# Patient Record
Sex: Female | Born: 1935
Health system: Southern US, Community
[De-identification: ages and names within clinical notes are randomized; demographics above are authoritative.]

## PROBLEM LIST (undated history)

## (undated) DIAGNOSIS — M4800 Spinal stenosis, site unspecified: Secondary | ICD-10-CM

## (undated) DIAGNOSIS — N189 Chronic kidney disease, unspecified: Secondary | ICD-10-CM

## (undated) DIAGNOSIS — I10 Essential (primary) hypertension: Secondary | ICD-10-CM

## (undated) DIAGNOSIS — E785 Hyperlipidemia, unspecified: Secondary | ICD-10-CM

## (undated) HISTORY — PX: TONSILLECTOMY: SUR1361

## (undated) HISTORY — DX: Hyperlipidemia, unspecified: E78.5

## (undated) HISTORY — DX: Essential (primary) hypertension: I10

## (undated) HISTORY — DX: Spinal stenosis, site unspecified: M48.00

## (undated) HISTORY — DX: Chronic kidney disease, unspecified: N18.9

---

## 1975-11-09 HISTORY — PX: BREAST BIOPSY: SHX20

## 1979-11-09 HISTORY — PX: TOTAL VAGINAL HYSTERECTOMY: SHX2548

## 1999-07-24 ENCOUNTER — Encounter: Payer: Self-pay | Admitting: Surgery

## 1999-07-29 ENCOUNTER — Observation Stay (HOSPITAL_COMMUNITY): Admission: RE | Admit: 1999-07-29 | Discharge: 1999-07-30 | Payer: Self-pay | Admitting: Surgery

## 1999-11-09 HISTORY — PX: CHOLECYSTECTOMY, LAPAROSCOPIC: SHX56

## 2000-03-09 ENCOUNTER — Other Ambulatory Visit: Admission: RE | Admit: 2000-03-09 | Discharge: 2000-03-09 | Payer: Self-pay | Admitting: Obstetrics and Gynecology

## 2001-05-05 ENCOUNTER — Other Ambulatory Visit: Admission: RE | Admit: 2001-05-05 | Discharge: 2001-05-05 | Payer: Self-pay | Admitting: Obstetrics and Gynecology

## 2002-11-19 ENCOUNTER — Ambulatory Visit (HOSPITAL_COMMUNITY): Admission: RE | Admit: 2002-11-19 | Discharge: 2002-11-19 | Payer: Self-pay | Admitting: Gastroenterology

## 2003-04-01 ENCOUNTER — Encounter: Admission: RE | Admit: 2003-04-01 | Discharge: 2003-05-02 | Payer: Self-pay | Admitting: Family Medicine

## 2011-11-15 DIAGNOSIS — Z1231 Encounter for screening mammogram for malignant neoplasm of breast: Secondary | ICD-10-CM | POA: Diagnosis not present

## 2011-12-16 DIAGNOSIS — H4011X0 Primary open-angle glaucoma, stage unspecified: Secondary | ICD-10-CM | POA: Diagnosis not present

## 2011-12-16 DIAGNOSIS — H409 Unspecified glaucoma: Secondary | ICD-10-CM | POA: Diagnosis not present

## 2012-02-21 DIAGNOSIS — I10 Essential (primary) hypertension: Secondary | ICD-10-CM | POA: Diagnosis not present

## 2012-02-21 DIAGNOSIS — J309 Allergic rhinitis, unspecified: Secondary | ICD-10-CM | POA: Diagnosis not present

## 2012-02-21 DIAGNOSIS — K219 Gastro-esophageal reflux disease without esophagitis: Secondary | ICD-10-CM | POA: Diagnosis not present

## 2012-02-21 DIAGNOSIS — E782 Mixed hyperlipidemia: Secondary | ICD-10-CM | POA: Diagnosis not present

## 2012-03-20 DIAGNOSIS — H409 Unspecified glaucoma: Secondary | ICD-10-CM | POA: Diagnosis not present

## 2012-03-20 DIAGNOSIS — H4011X0 Primary open-angle glaucoma, stage unspecified: Secondary | ICD-10-CM | POA: Diagnosis not present

## 2012-03-20 DIAGNOSIS — H2510 Age-related nuclear cataract, unspecified eye: Secondary | ICD-10-CM | POA: Diagnosis not present

## 2012-03-20 DIAGNOSIS — H26019 Infantile and juvenile cortical, lamellar, or zonular cataract, unspecified eye: Secondary | ICD-10-CM | POA: Diagnosis not present

## 2012-05-05 DIAGNOSIS — I10 Essential (primary) hypertension: Secondary | ICD-10-CM | POA: Diagnosis not present

## 2012-05-05 DIAGNOSIS — K219 Gastro-esophageal reflux disease without esophagitis: Secondary | ICD-10-CM | POA: Diagnosis not present

## 2012-05-05 DIAGNOSIS — E782 Mixed hyperlipidemia: Secondary | ICD-10-CM | POA: Diagnosis not present

## 2012-05-05 DIAGNOSIS — J309 Allergic rhinitis, unspecified: Secondary | ICD-10-CM | POA: Diagnosis not present

## 2012-06-09 DIAGNOSIS — L820 Inflamed seborrheic keratosis: Secondary | ICD-10-CM | POA: Diagnosis not present

## 2012-06-09 DIAGNOSIS — L723 Sebaceous cyst: Secondary | ICD-10-CM | POA: Diagnosis not present

## 2012-06-19 DIAGNOSIS — H4011X0 Primary open-angle glaucoma, stage unspecified: Secondary | ICD-10-CM | POA: Diagnosis not present

## 2012-06-19 DIAGNOSIS — H409 Unspecified glaucoma: Secondary | ICD-10-CM | POA: Diagnosis not present

## 2012-08-18 DIAGNOSIS — Z23 Encounter for immunization: Secondary | ICD-10-CM | POA: Diagnosis not present

## 2012-09-08 DIAGNOSIS — E559 Vitamin D deficiency, unspecified: Secondary | ICD-10-CM | POA: Diagnosis not present

## 2012-09-08 DIAGNOSIS — Z01419 Encounter for gynecological examination (general) (routine) without abnormal findings: Secondary | ICD-10-CM | POA: Diagnosis not present

## 2012-09-08 DIAGNOSIS — Z124 Encounter for screening for malignant neoplasm of cervix: Secondary | ICD-10-CM | POA: Diagnosis not present

## 2012-09-21 DIAGNOSIS — H4011X0 Primary open-angle glaucoma, stage unspecified: Secondary | ICD-10-CM | POA: Diagnosis not present

## 2012-09-21 DIAGNOSIS — H409 Unspecified glaucoma: Secondary | ICD-10-CM | POA: Diagnosis not present

## 2012-11-08 HISTORY — PX: CATARACT EXTRACTION, BILATERAL: SHX1313

## 2012-12-25 DIAGNOSIS — H409 Unspecified glaucoma: Secondary | ICD-10-CM | POA: Diagnosis not present

## 2012-12-25 DIAGNOSIS — H4011X0 Primary open-angle glaucoma, stage unspecified: Secondary | ICD-10-CM | POA: Diagnosis not present

## 2013-01-16 DIAGNOSIS — L820 Inflamed seborrheic keratosis: Secondary | ICD-10-CM | POA: Diagnosis not present

## 2013-01-16 DIAGNOSIS — L819 Disorder of pigmentation, unspecified: Secondary | ICD-10-CM | POA: Diagnosis not present

## 2013-01-16 DIAGNOSIS — L84 Corns and callosities: Secondary | ICD-10-CM | POA: Diagnosis not present

## 2013-02-06 DIAGNOSIS — I10 Essential (primary) hypertension: Secondary | ICD-10-CM | POA: Diagnosis not present

## 2013-02-06 DIAGNOSIS — J309 Allergic rhinitis, unspecified: Secondary | ICD-10-CM | POA: Diagnosis not present

## 2013-02-06 DIAGNOSIS — E782 Mixed hyperlipidemia: Secondary | ICD-10-CM | POA: Diagnosis not present

## 2013-02-06 DIAGNOSIS — K219 Gastro-esophageal reflux disease without esophagitis: Secondary | ICD-10-CM | POA: Diagnosis not present

## 2013-02-12 DIAGNOSIS — K219 Gastro-esophageal reflux disease without esophagitis: Secondary | ICD-10-CM | POA: Diagnosis not present

## 2013-02-12 DIAGNOSIS — J309 Allergic rhinitis, unspecified: Secondary | ICD-10-CM | POA: Diagnosis not present

## 2013-02-12 DIAGNOSIS — M629 Disorder of muscle, unspecified: Secondary | ICD-10-CM | POA: Diagnosis not present

## 2013-02-12 DIAGNOSIS — E782 Mixed hyperlipidemia: Secondary | ICD-10-CM | POA: Diagnosis not present

## 2013-02-12 DIAGNOSIS — Z79899 Other long term (current) drug therapy: Secondary | ICD-10-CM | POA: Diagnosis not present

## 2013-02-12 DIAGNOSIS — I10 Essential (primary) hypertension: Secondary | ICD-10-CM | POA: Diagnosis not present

## 2013-03-26 DIAGNOSIS — H26019 Infantile and juvenile cortical, lamellar, or zonular cataract, unspecified eye: Secondary | ICD-10-CM | POA: Diagnosis not present

## 2013-03-26 DIAGNOSIS — H409 Unspecified glaucoma: Secondary | ICD-10-CM | POA: Diagnosis not present

## 2013-03-26 DIAGNOSIS — Z79899 Other long term (current) drug therapy: Secondary | ICD-10-CM | POA: Diagnosis not present

## 2013-03-26 DIAGNOSIS — I10 Essential (primary) hypertension: Secondary | ICD-10-CM | POA: Diagnosis not present

## 2013-03-26 DIAGNOSIS — S336XXA Sprain of sacroiliac joint, initial encounter: Secondary | ICD-10-CM | POA: Diagnosis not present

## 2013-03-26 DIAGNOSIS — H4011X0 Primary open-angle glaucoma, stage unspecified: Secondary | ICD-10-CM | POA: Diagnosis not present

## 2013-03-26 DIAGNOSIS — H2510 Age-related nuclear cataract, unspecified eye: Secondary | ICD-10-CM | POA: Diagnosis not present

## 2013-04-04 ENCOUNTER — Ambulatory Visit: Payer: Medicare Other | Attending: Family Medicine | Admitting: Physical Therapy

## 2013-04-04 DIAGNOSIS — M25559 Pain in unspecified hip: Secondary | ICD-10-CM | POA: Insufficient documentation

## 2013-04-04 DIAGNOSIS — IMO0001 Reserved for inherently not codable concepts without codable children: Secondary | ICD-10-CM | POA: Diagnosis not present

## 2013-04-06 ENCOUNTER — Ambulatory Visit: Payer: Medicare Other | Admitting: Physical Therapy

## 2013-04-09 ENCOUNTER — Ambulatory Visit: Payer: Medicare Other | Attending: Family Medicine | Admitting: Physical Therapy

## 2013-04-09 DIAGNOSIS — M25559 Pain in unspecified hip: Secondary | ICD-10-CM | POA: Insufficient documentation

## 2013-04-09 DIAGNOSIS — IMO0001 Reserved for inherently not codable concepts without codable children: Secondary | ICD-10-CM | POA: Insufficient documentation

## 2013-04-10 ENCOUNTER — Ambulatory Visit: Payer: Medicare Other | Admitting: Physical Therapy

## 2013-04-11 DIAGNOSIS — H2510 Age-related nuclear cataract, unspecified eye: Secondary | ICD-10-CM | POA: Diagnosis not present

## 2013-04-11 DIAGNOSIS — H26019 Infantile and juvenile cortical, lamellar, or zonular cataract, unspecified eye: Secondary | ICD-10-CM | POA: Diagnosis not present

## 2013-04-18 DIAGNOSIS — H26019 Infantile and juvenile cortical, lamellar, or zonular cataract, unspecified eye: Secondary | ICD-10-CM | POA: Diagnosis not present

## 2013-04-18 DIAGNOSIS — H2510 Age-related nuclear cataract, unspecified eye: Secondary | ICD-10-CM | POA: Diagnosis not present

## 2013-04-24 ENCOUNTER — Ambulatory Visit: Payer: Medicare Other | Admitting: Physical Therapy

## 2013-04-26 ENCOUNTER — Ambulatory Visit: Payer: Medicare Other | Admitting: Physical Therapy

## 2013-05-01 ENCOUNTER — Ambulatory Visit: Payer: Medicare Other | Admitting: Physical Therapy

## 2013-05-03 ENCOUNTER — Ambulatory Visit: Payer: Medicare Other | Admitting: Physical Therapy

## 2013-05-07 ENCOUNTER — Ambulatory Visit: Payer: Medicare Other | Admitting: Physical Therapy

## 2013-05-08 ENCOUNTER — Ambulatory Visit: Payer: Medicare Other | Attending: Family Medicine | Admitting: Physical Therapy

## 2013-05-08 DIAGNOSIS — M25559 Pain in unspecified hip: Secondary | ICD-10-CM | POA: Diagnosis not present

## 2013-05-08 DIAGNOSIS — IMO0001 Reserved for inherently not codable concepts without codable children: Secondary | ICD-10-CM | POA: Insufficient documentation

## 2013-05-17 ENCOUNTER — Ambulatory Visit: Payer: Medicare Other | Admitting: Physical Therapy

## 2013-05-21 ENCOUNTER — Ambulatory Visit: Payer: Medicare Other | Admitting: Physical Therapy

## 2013-05-23 ENCOUNTER — Ambulatory Visit: Payer: Medicare Other | Admitting: Physical Therapy

## 2013-05-24 ENCOUNTER — Encounter: Payer: Medicare Other | Admitting: Physical Therapy

## 2013-05-28 ENCOUNTER — Ambulatory Visit: Payer: Medicare Other | Admitting: Physical Therapy

## 2013-06-05 ENCOUNTER — Ambulatory Visit: Payer: Medicare Other | Admitting: Physical Therapy

## 2013-06-06 ENCOUNTER — Ambulatory Visit: Payer: Medicare Other | Admitting: Physical Therapy

## 2013-07-20 DIAGNOSIS — Z780 Asymptomatic menopausal state: Secondary | ICD-10-CM | POA: Diagnosis not present

## 2013-08-06 DIAGNOSIS — Z23 Encounter for immunization: Secondary | ICD-10-CM | POA: Diagnosis not present

## 2013-08-06 DIAGNOSIS — M25569 Pain in unspecified knee: Secondary | ICD-10-CM | POA: Diagnosis not present

## 2013-08-27 ENCOUNTER — Telehealth: Payer: Self-pay

## 2013-08-27 DIAGNOSIS — H409 Unspecified glaucoma: Secondary | ICD-10-CM | POA: Diagnosis not present

## 2013-08-27 DIAGNOSIS — Z961 Presence of intraocular lens: Secondary | ICD-10-CM | POA: Diagnosis not present

## 2013-08-27 DIAGNOSIS — H4011X0 Primary open-angle glaucoma, stage unspecified: Secondary | ICD-10-CM | POA: Diagnosis not present

## 2013-08-27 NOTE — Telephone Encounter (Signed)
Spoke with patient re: rx for vitamin d. Has been taking 16109 every other week. Her level last year was 53, which by new guidelines she should just take 1000 OTC daily. Patient aware to start this and we will check at her AEX on 09/10/13.

## 2013-08-28 ENCOUNTER — Telehealth: Payer: Self-pay | Admitting: *Deleted

## 2013-08-28 NOTE — Telephone Encounter (Signed)
Pt has a Annual Exam scheduled for 09/10/13, will give refill if needed.

## 2013-09-10 ENCOUNTER — Ambulatory Visit (INDEPENDENT_AMBULATORY_CARE_PROVIDER_SITE_OTHER): Payer: Medicare Other | Admitting: Nurse Practitioner

## 2013-09-10 ENCOUNTER — Encounter: Payer: Self-pay | Admitting: Obstetrics and Gynecology

## 2013-09-10 ENCOUNTER — Encounter: Payer: Self-pay | Admitting: Nurse Practitioner

## 2013-09-10 VITALS — BP 120/66 | HR 64 | Resp 16 | Ht 62.0 in | Wt 134.0 lb

## 2013-09-10 DIAGNOSIS — Z01419 Encounter for gynecological examination (general) (routine) without abnormal findings: Secondary | ICD-10-CM

## 2013-09-10 DIAGNOSIS — Z124 Encounter for screening for malignant neoplasm of cervix: Secondary | ICD-10-CM | POA: Diagnosis not present

## 2013-09-10 DIAGNOSIS — E7800 Pure hypercholesterolemia, unspecified: Secondary | ICD-10-CM | POA: Diagnosis not present

## 2013-09-10 DIAGNOSIS — N952 Postmenopausal atrophic vaginitis: Secondary | ICD-10-CM

## 2013-09-10 DIAGNOSIS — I10 Essential (primary) hypertension: Secondary | ICD-10-CM | POA: Insufficient documentation

## 2013-09-10 DIAGNOSIS — Z79890 Hormone replacement therapy (postmenopausal): Secondary | ICD-10-CM | POA: Diagnosis not present

## 2013-09-10 MED ORDER — ESTRADIOL 10 MCG VA TABS: 1.0000 | ORAL_TABLET | VAGINAL | Status: DC

## 2013-09-10 MED ORDER — ESTRADIOL 0.025 MG/24HR TD PTTW: 1.0000 | MEDICATED_PATCH | TRANSDERMAL | Status: DC

## 2013-09-10 NOTE — Patient Instructions (Signed)

## 2013-09-10 NOTE — Progress Notes (Signed)
Patient ID: Tracy Bernard, female   DOB: 09/29/1931, 79 y.o.   MRN: [id number] 79 y.o. G38P1001 Widowed Caucasian Fe here for annual exam.  Recently went on OTC Vit D.  Had bilateral cataract surgery this year.  Doing well. [place] Same 'friend' but not SA.  No LMP recorded. Patient has had a hysterectomy.          Sexually active: yes  The current method of family planning is status post hysterectomy.    Exercising: no  The patient does not participate in regular exercise at present. Smoker:  no  Health Maintenance: Pap: 04/2001 MMG: 2014, normal per patent, report to PCP Colonoscopy: 05/2009, repeat 5 years BMD: 2014, normal per patent, report to PCP (report of 6/12 was normal) TDaP: 2011 Zostavax:  Oct. 2007 Pneumovax:  10/05 Labs: PCP   reports that she has never smoked. She has never used smokeless tobacco. She reports that she drinks about 1.0 ounces of alcohol per week. She reports that she does not use illicit drugs.  Past Medical History  Diagnosis Date  . Hyperlipidemia   . Hypertension     Past Surgical History  Procedure Laterality Date  . Total vaginal hysterectomy  1981    secondary to uterine prolapse  . Breast biopsy Left 1977    benign  . Cholecystectomy, laparoscopic  2001  . Cataract extraction, bilateral  2014    Current Outpatient Prescriptions  Medication Sig Dispense Refill  . aspirin EC 81 MG tablet Take 81 mg by mouth daily.      . Calcium Carbonate-Vitamin D (CALCIUM 500 + D PO) Take 2 tablets by mouth daily.      . Estradiol (VAGIFEM) 10 MCG TABS vaginal tablet Place 1 tablet vaginally 2 (two) times a week.      . estradiol (VIVELLE-DOT) 0.025 MG/24HR Place 1 patch onto the skin 2 (two) times a week.      . fexofenadine (ALLEGRA) 180 MG tablet Take 180 mg by mouth daily.      Marland Kitchen FIBER PO Take by mouth.      . fluticasone (FLONASE) 50 MCG/ACT nasal spray Place 2 sprays into the nose daily.      . folic acid (FOLVITE)  800 MCG tablet Take 400 mcg by mouth daily.      Marland Kitchen glucosamine-chondroitin 500-400 MG tablet Take 1 tablet by mouth 3 (three) times daily.      Marland Kitchen latanoprost (XALATAN) 0.005 % ophthalmic solution Place 1 drop into both eyes at bedtime.      Marland Kitchen losartan (COZAAR) 25 MG tablet Take 25 mg by mouth daily.      . Multiple Vitamin (MULTIVITAMIN) tablet Take 1 tablet by mouth daily.      . Omega-3 Fatty Acids (FISH OIL) 1200 MG CAPS Take 1 capsule by mouth daily.      Marland Kitchen omeprazole (PRILOSEC) 20 MG capsule Take 20 mg by mouth daily.      . simvastatin (ZOCOR) 20 MG tablet Take 20 mg by mouth every evening.      . vitamin C (ASCORBIC ACID) 500 MG tablet Take 500 mg by mouth daily.      . Vitamin D, Ergocalciferol, (DRISDOL) 50000 UNITS CAPS capsule Take 50,000 Units by mouth every 14 (fourteen) days.      . vitamin E 400 UNIT capsule Take 400 Units by mouth daily.       No current facility-administered medications for this visit.  Family History  Problem Relation Age of Onset  . Breast cancer Sister   . Stroke Sister   . Colon cancer Daughter   . Anuerysm Daughter     ROS:  Pertinent items are noted in HPI.  Otherwise, a comprehensive ROS was negative.  Exam:   BP 120/66  Pulse 64  Resp 16  Ht 5\' 2"  (1.575 m)  Wt 134 lb (60.782 kg)  BMI 24.50 kg/m2 Height: 5\' 2"  (157.5 cm)  Ht Readings from Last 3 Encounters:  09/10/13 5\' 2"  (1.575 m)    General appearance: alert, cooperative and appears stated age Head: Normocephalic, without obvious abnormality, atraumatic Neck: no adenopathy, supple, symmetrical, trachea midline and thyroid normal to inspection and palpation Lungs: clear to auscultation bilaterally Breasts: normal appearance, no masses or tenderness Heart: regular rate and rhythm Abdomen: soft, non-tender; no masses,  no organomegaly Extremities: extremities normal, atraumatic, no cyanosis or edema Skin: Skin color, texture, turgor normal. No rashes or lesions Lymph nodes:  Cervical, supraclavicular, and axillary nodes normal. No abnormal inguinal nodes palpated Neurologic: Grossly normal   Pelvic: External genitalia:  no lesions              Urethra:  normal appearing urethra with no masses, tenderness or lesions              Bartholin's and Skene's: normal                 Vagina: atrophic appearing vagina with pale color and discharge, no lesions              Cervix: absent              Pap taken: no Bimanual Exam:  Uterus:  uterus absent              Adnexa: no mass, fullness, tenderness               Rectovaginal: Confirms               Anus:  normal sphincter tone, no lesions  A:  Well Woman with normal exam  S/P TVH secondary to prolapse on ERT since about 1980's  History of sleep disturbance off ERT  Atrophic vaginitis  Vit D deficiency now on OTC Vit D     P:   Pap smear as per guidelines Not indicated  Mammogram due 2015 - will get ROI for this years  Does not need a refill on ERT or Vagifem at this time.  She is going to try to wean off Vivellle dot this year.  Discussed risk of ERT with DVT, CVA, cancer,etc.  Counseled on breast self exam, osteoporosis, adequate intake of calcium and vitamin D, diet and exercise, Kegel's exercises return annually or prn  An After Visit Summary was printed and given to the patient.

## 2013-09-12 NOTE — Progress Notes (Signed)
Encounter reviewed by Dr. Brook Silva.  

## 2013-09-13 ENCOUNTER — Other Ambulatory Visit: Payer: Self-pay

## 2013-09-19 ENCOUNTER — Telehealth: Payer: Self-pay | Admitting: *Deleted

## 2013-09-19 NOTE — Telephone Encounter (Signed)
I have attempted to contact this patient by phone with the following results: left message to return my call on answering machine (home).  

## 2013-09-20 NOTE — Telephone Encounter (Signed)
Pt notified of bone density results.  Hardcopy sent to scan.

## 2013-09-26 DIAGNOSIS — M159 Polyosteoarthritis, unspecified: Secondary | ICD-10-CM | POA: Diagnosis not present

## 2013-09-26 DIAGNOSIS — H409 Unspecified glaucoma: Secondary | ICD-10-CM | POA: Diagnosis not present

## 2013-09-26 DIAGNOSIS — E782 Mixed hyperlipidemia: Secondary | ICD-10-CM | POA: Diagnosis not present

## 2013-09-26 DIAGNOSIS — I10 Essential (primary) hypertension: Secondary | ICD-10-CM | POA: Diagnosis not present

## 2013-09-26 DIAGNOSIS — J309 Allergic rhinitis, unspecified: Secondary | ICD-10-CM | POA: Diagnosis not present

## 2013-09-26 DIAGNOSIS — K219 Gastro-esophageal reflux disease without esophagitis: Secondary | ICD-10-CM | POA: Diagnosis not present

## 2014-07-18 ENCOUNTER — Encounter: Payer: Self-pay | Admitting: Nurse Practitioner

## 2014-08-16 ENCOUNTER — Other Ambulatory Visit: Payer: Self-pay | Admitting: Dermatology

## 2014-09-09 ENCOUNTER — Encounter: Payer: Self-pay | Admitting: Nurse Practitioner

## 2014-09-12 ENCOUNTER — Ambulatory Visit: Payer: 59 | Admitting: Certified Nurse Midwife

## 2014-09-12 ENCOUNTER — Ambulatory Visit: Payer: 59 | Admitting: Nurse Practitioner

## 2014-09-19 ENCOUNTER — Telehealth: Payer: Self-pay | Admitting: Nurse Practitioner

## 2014-09-19 ENCOUNTER — Ambulatory Visit: Payer: 59 | Admitting: Nurse Practitioner

## 2014-09-19 NOTE — Telephone Encounter (Signed)
Per request records printed

## 2014-09-19 NOTE — Telephone Encounter (Signed)
Pt has [employer] now and will need prior authorization patient new insurance card is scanned in.

## 2014-09-19 NOTE — Telephone Encounter (Signed)
Will you print her office notes from 09/10/13-provider is PG-this is for [facility]. Do I need to provide any other information for this request?

## 2014-09-24 ENCOUNTER — Telehealth: Payer: Self-pay | Admitting: Nurse Practitioner

## 2014-09-24 NOTE — Telephone Encounter (Signed)
LMTCB about appointment for annual  [employer] has approved annual visit please schedule appointment

## 2014-11-19 ENCOUNTER — Ambulatory Visit: Payer: 59 | Admitting: Nurse Practitioner

## 2014-12-17 ENCOUNTER — Ambulatory Visit (INDEPENDENT_AMBULATORY_CARE_PROVIDER_SITE_OTHER): Payer: Medicare Other | Admitting: Nurse Practitioner

## 2014-12-17 ENCOUNTER — Encounter: Payer: Self-pay | Admitting: Nurse Practitioner

## 2014-12-17 VITALS — BP 142/80 | HR 68 | Resp 16 | Ht 62.0 in | Wt 131.2 lb

## 2014-12-17 DIAGNOSIS — I10 Essential (primary) hypertension: Secondary | ICD-10-CM

## 2014-12-17 DIAGNOSIS — Z01419 Encounter for gynecological examination (general) (routine) without abnormal findings: Secondary | ICD-10-CM | POA: Diagnosis not present

## 2014-12-17 DIAGNOSIS — E7800 Pure hypercholesterolemia, unspecified: Secondary | ICD-10-CM

## 2014-12-17 DIAGNOSIS — Z1211 Encounter for screening for malignant neoplasm of colon: Secondary | ICD-10-CM

## 2014-12-17 DIAGNOSIS — E559 Vitamin D deficiency, unspecified: Secondary | ICD-10-CM | POA: Diagnosis not present

## 2014-12-17 DIAGNOSIS — Z79890 Hormone replacement therapy (postmenopausal): Secondary | ICD-10-CM

## 2014-12-17 DIAGNOSIS — N952 Postmenopausal atrophic vaginitis: Secondary | ICD-10-CM | POA: Diagnosis not present

## 2014-12-17 DIAGNOSIS — Z124 Encounter for screening for malignant neoplasm of cervix: Secondary | ICD-10-CM

## 2014-12-17 MED ORDER — ESTROGENS, CONJUGATED 0.625 MG/GM VA CREA: TOPICAL_CREAM | VAGINAL | Status: DC

## 2014-12-17 NOTE — Progress Notes (Signed)
81 y.o. G78P1001 Widowed  Caucasian Fe here for annual exam.  She has had an increase in hair thinning.   She now wears a wig most times in public.  Same partner for about 10 years. Last SA about a month ago and was very painful.  She is currently off vaginal estrogen -mainly secondary to cost.  She has now weaned off ERT.  No LMP recorded. Patient has had a hysterectomy.          Sexually active: Yes.    The current method of family planning is status post hysterectomy.    Exercising: No.  not regularly Smoker:  no  Health Maintenance: Pap:  2002 MMG:  03/11/14 3D-normal Colonoscopy:  7/10-repeat in 5 years-PCP spoke with GI, patient was told will not need another one BMD:   07/20/13-normal left hip neck -0.9 TDaP:  2011 Shingles: 08/2006 Prevnar 13:  fall 2015 Labs: PCP   reports that she has never smoked. She has never used smokeless tobacco. She reports that she drinks about 1.0 oz of alcohol per week. She reports that she does not use illicit drugs.  Past Medical History  Diagnosis Date  . Hyperlipidemia   . Hypertension     Past Surgical History  Procedure Laterality Date  . Total vaginal hysterectomy  1981    secondary to uterine prolapse  . Breast biopsy Left 1977    benign  . Cholecystectomy, laparoscopic  2001  . Cataract extraction, bilateral Bilateral 2014    Current Outpatient Prescriptions  Medication Sig Dispense Refill  . aspirin EC 81 MG tablet Take 81 mg by mouth daily.    Marland Kitchen BIOTIN 5000 PO Take by mouth.    . Calcium Carbonate-Vitamin D (CALCIUM 500 + D PO) Take 2 tablets by mouth daily.    . Cholecalciferol (VITAMIN D PO) Take 1,000 Int'l Units by mouth daily.    . fexofenadine (ALLEGRA) 180 MG tablet Take 180 mg by mouth daily.    Marland Kitchen FIBER PO Take by mouth.    . fluticasone (FLONASE) 50 MCG/ACT nasal spray Place 2 sprays into the nose daily.    . folic acid (FOLVITE) 782 MCG tablet Take 400 mcg by mouth daily.    Marland Kitchen glucosamine-chondroitin 500-400 MG tablet  Take 1 tablet by mouth 3 (three) times daily.    Marland Kitchen latanoprost (XALATAN) 0.005 % ophthalmic solution Place 1 drop into both eyes at bedtime.    Marland Kitchen losartan (COZAAR) 25 MG tablet Take 25 mg by mouth daily.    . Multiple Vitamin (MULTIVITAMIN) tablet Take 1 tablet by mouth daily.    . Omega-3 Fatty Acids (FISH OIL) 1200 MG CAPS Take 1 capsule by mouth daily.    Marland Kitchen omeprazole (PRILOSEC) 20 MG capsule Take 20 mg by mouth daily.    . Probiotic Product (PROBIOTIC DAILY PO) Take by mouth.    . simvastatin (ZOCOR) 20 MG tablet Take 20 mg by mouth every evening.    . vitamin B-12 (CYANOCOBALAMIN) 1000 MCG tablet Take 1,000 mcg by mouth daily.    . vitamin C (ASCORBIC ACID) 500 MG tablet Take 500 mg by mouth daily.    . vitamin E 400 UNIT capsule Take 400 Units by mouth daily.    Marland Kitchen conjugated estrogens (PREMARIN) vaginal cream Use 1/2 g vaginally twice wekly 60 g 3   No current facility-administered medications for this visit.    Family History  Problem Relation Age of Onset  . Breast cancer Sister   . Stroke  Sister   . Colon cancer Daughter   . Anuerysm Daughter   . Kidney failure Mother   . Hypertension Mother   . Kidney failure Father   . Hypertension Father     ROS:  Pertinent items are noted in HPI.  Otherwise, a comprehensive ROS was negative.  Exam:   BP 142/80 mmHg  Pulse 68  Resp 16  Ht 5\' 2"  (1.575 m)  Wt 131 lb 3.2 oz (59.512 kg)  BMI 23.99 kg/m2 Height: 5\' 2"  (157.5 cm) Ht Readings from Last 3 Encounters:  12/17/14 5\' 2"  (1.575 m)  09/10/13 5\' 2"  (1.575 m)    General appearance: alert, cooperative and appears stated age Head: Normocephalic, without obvious abnormality, atraumatic Neck: no adenopathy, supple, symmetrical, trachea midline and thyroid normal to inspection and palpation Lungs: clear to auscultation bilaterally Breasts: normal appearance, no masses or tenderness Heart: regular rate and rhythm Abdomen: soft, non-tender; no masses,  no  organomegaly Extremities: extremities normal, atraumatic, no cyanosis or edema Skin: Skin color, texture, turgor normal. No rashes or lesions Lymph nodes: Cervical, supraclavicular, and axillary nodes normal. No abnormal inguinal nodes palpated Neurologic: Grossly normal   Pelvic: External genitalia:  no lesions              Urethra:  Prolapse of the urethra with no masses, tenderness or lesions              Bartholin's and Skene's: normal                 Vagina: very atrophic appearing vagina with pale color and discharge, no lesions              Cervix: absent              Pap taken: No. Bimanual Exam:  Uterus:  uterus absent              Adnexa: no mass, fullness, tenderness               Rectovaginal: Confirms               Anus:  normal sphincter tone, no lesions  Chaperone present: Yes  A:  Well Woman with normal exam  S/P TVH secondary to prolapse on ERT since about 1980's and stopped 2015 History of sleep disturbance off ERT - doing OK now Atrophic vaginitis - currently off vaginal estrogen Vit D deficiency now on OTC Vit D  Thinning hair  HTN, hypercholesterolemia   P:   Reviewed health and wellness pertinent to exam  Pap smear not taken today  Mammogram is due 5/29016  Refill on Premarin vaginal cream to use pea size at the urethra twice weekly  Counseled with risk of CVA, DVT, cancer, etc  Counseled on breast self exam, mammography screening, use and side effects of HRT, adequate intake of calcium and vitamin D, diet and exercise return annually or prn  An After Visit Summary was printed and given to the patient.

## 2014-12-17 NOTE — Patient Instructions (Signed)

## 2014-12-20 NOTE — Progress Notes (Signed)
Reviewed personally.  M. Suzanne Quanisha Drewry, MD.  

## 2015-01-06 DIAGNOSIS — H4011X1 Primary open-angle glaucoma, mild stage: Secondary | ICD-10-CM | POA: Diagnosis not present

## 2015-01-14 LAB — FECAL OCCULT BLOOD, IMMUNOCHEMICAL: IFOBT: NEGATIVE

## 2015-01-14 NOTE — Addendum Note (Signed)
Addended by: Robley Fries on: 01/14/2015 11:38 AM   Modules accepted: Orders

## 2015-03-14 DIAGNOSIS — L820 Inflamed seborrheic keratosis: Secondary | ICD-10-CM | POA: Diagnosis not present

## 2015-03-14 DIAGNOSIS — D1801 Hemangioma of skin and subcutaneous tissue: Secondary | ICD-10-CM | POA: Diagnosis not present

## 2015-03-14 DIAGNOSIS — L821 Other seborrheic keratosis: Secondary | ICD-10-CM | POA: Diagnosis not present

## 2015-03-14 DIAGNOSIS — D225 Melanocytic nevi of trunk: Secondary | ICD-10-CM | POA: Diagnosis not present

## 2015-03-14 DIAGNOSIS — L814 Other melanin hyperpigmentation: Secondary | ICD-10-CM | POA: Diagnosis not present

## 2015-03-14 DIAGNOSIS — L570 Actinic keratosis: Secondary | ICD-10-CM | POA: Diagnosis not present

## 2015-04-03 DIAGNOSIS — H4011X1 Primary open-angle glaucoma, mild stage: Secondary | ICD-10-CM | POA: Diagnosis not present

## 2015-04-25 DIAGNOSIS — Z23 Encounter for immunization: Secondary | ICD-10-CM | POA: Diagnosis not present

## 2015-04-25 DIAGNOSIS — I10 Essential (primary) hypertension: Secondary | ICD-10-CM | POA: Diagnosis not present

## 2015-04-25 DIAGNOSIS — Z0000 Encounter for general adult medical examination without abnormal findings: Secondary | ICD-10-CM | POA: Diagnosis not present

## 2015-04-25 DIAGNOSIS — N183 Chronic kidney disease, stage 3 (moderate): Secondary | ICD-10-CM | POA: Diagnosis not present

## 2015-04-25 DIAGNOSIS — K219 Gastro-esophageal reflux disease without esophagitis: Secondary | ICD-10-CM | POA: Diagnosis not present

## 2015-04-25 DIAGNOSIS — E782 Mixed hyperlipidemia: Secondary | ICD-10-CM | POA: Diagnosis not present

## 2015-04-30 DIAGNOSIS — E782 Mixed hyperlipidemia: Secondary | ICD-10-CM | POA: Diagnosis not present

## 2015-04-30 DIAGNOSIS — H409 Unspecified glaucoma: Secondary | ICD-10-CM | POA: Diagnosis not present

## 2015-04-30 DIAGNOSIS — J309 Allergic rhinitis, unspecified: Secondary | ICD-10-CM | POA: Diagnosis not present

## 2015-04-30 DIAGNOSIS — Z1389 Encounter for screening for other disorder: Secondary | ICD-10-CM | POA: Diagnosis not present

## 2015-04-30 DIAGNOSIS — N183 Chronic kidney disease, stage 3 (moderate): Secondary | ICD-10-CM | POA: Diagnosis not present

## 2015-04-30 DIAGNOSIS — I10 Essential (primary) hypertension: Secondary | ICD-10-CM | POA: Diagnosis not present

## 2015-04-30 DIAGNOSIS — Z0000 Encounter for general adult medical examination without abnormal findings: Secondary | ICD-10-CM | POA: Diagnosis not present

## 2015-04-30 DIAGNOSIS — Z86010 Personal history of colonic polyps: Secondary | ICD-10-CM | POA: Diagnosis not present

## 2015-04-30 DIAGNOSIS — K219 Gastro-esophageal reflux disease without esophagitis: Secondary | ICD-10-CM | POA: Diagnosis not present

## 2015-06-11 DIAGNOSIS — Z1231 Encounter for screening mammogram for malignant neoplasm of breast: Secondary | ICD-10-CM | POA: Diagnosis not present

## 2015-07-07 DIAGNOSIS — Z961 Presence of intraocular lens: Secondary | ICD-10-CM | POA: Diagnosis not present

## 2015-07-07 DIAGNOSIS — H4011X1 Primary open-angle glaucoma, mild stage: Secondary | ICD-10-CM | POA: Diagnosis not present

## 2015-07-28 DIAGNOSIS — Z23 Encounter for immunization: Secondary | ICD-10-CM | POA: Diagnosis not present

## 2015-10-27 DIAGNOSIS — H401131 Primary open-angle glaucoma, bilateral, mild stage: Secondary | ICD-10-CM | POA: Diagnosis not present

## 2015-11-25 DIAGNOSIS — D485 Neoplasm of uncertain behavior of skin: Secondary | ICD-10-CM | POA: Diagnosis not present

## 2015-11-25 DIAGNOSIS — D044 Carcinoma in situ of skin of scalp and neck: Secondary | ICD-10-CM | POA: Diagnosis not present

## 2015-11-25 DIAGNOSIS — L821 Other seborrheic keratosis: Secondary | ICD-10-CM | POA: Diagnosis not present

## 2015-11-25 DIAGNOSIS — L570 Actinic keratosis: Secondary | ICD-10-CM | POA: Diagnosis not present

## 2015-12-17 DIAGNOSIS — C4442 Squamous cell carcinoma of skin of scalp and neck: Secondary | ICD-10-CM | POA: Diagnosis not present

## 2015-12-17 DIAGNOSIS — Z85828 Personal history of other malignant neoplasm of skin: Secondary | ICD-10-CM | POA: Diagnosis not present

## 2015-12-22 ENCOUNTER — Ambulatory Visit: Payer: Medicare Other | Admitting: Nurse Practitioner

## 2016-01-09 ENCOUNTER — Ambulatory Visit: Payer: Medicare Other | Admitting: Nurse Practitioner

## 2016-01-27 ENCOUNTER — Ambulatory Visit: Payer: Medicare Other | Admitting: Nurse Practitioner

## 2016-02-13 DIAGNOSIS — C44622 Squamous cell carcinoma of skin of right upper limb, including shoulder: Secondary | ICD-10-CM | POA: Diagnosis not present

## 2016-02-13 DIAGNOSIS — D485 Neoplasm of uncertain behavior of skin: Secondary | ICD-10-CM | POA: Diagnosis not present

## 2016-02-13 DIAGNOSIS — L820 Inflamed seborrheic keratosis: Secondary | ICD-10-CM | POA: Diagnosis not present

## 2016-02-13 DIAGNOSIS — Z85828 Personal history of other malignant neoplasm of skin: Secondary | ICD-10-CM | POA: Diagnosis not present

## 2016-02-16 DIAGNOSIS — H401131 Primary open-angle glaucoma, bilateral, mild stage: Secondary | ICD-10-CM | POA: Diagnosis not present

## 2016-02-24 ENCOUNTER — Encounter: Payer: Self-pay | Admitting: Nurse Practitioner

## 2016-02-24 ENCOUNTER — Ambulatory Visit (INDEPENDENT_AMBULATORY_CARE_PROVIDER_SITE_OTHER): Payer: Medicare Other | Admitting: Nurse Practitioner

## 2016-02-24 VITALS — BP 124/78 | HR 64 | Ht 62.0 in | Wt 130.0 lb

## 2016-02-24 DIAGNOSIS — N952 Postmenopausal atrophic vaginitis: Secondary | ICD-10-CM

## 2016-02-24 DIAGNOSIS — E559 Vitamin D deficiency, unspecified: Secondary | ICD-10-CM

## 2016-02-24 DIAGNOSIS — I10 Essential (primary) hypertension: Secondary | ICD-10-CM

## 2016-02-24 DIAGNOSIS — Z01419 Encounter for gynecological examination (general) (routine) without abnormal findings: Secondary | ICD-10-CM

## 2016-02-24 DIAGNOSIS — E7800 Pure hypercholesterolemia, unspecified: Secondary | ICD-10-CM

## 2016-02-24 DIAGNOSIS — Z124 Encounter for screening for malignant neoplasm of cervix: Secondary | ICD-10-CM | POA: Diagnosis not present

## 2016-02-24 DIAGNOSIS — Z1211 Encounter for screening for malignant neoplasm of colon: Secondary | ICD-10-CM | POA: Diagnosis not present

## 2016-02-24 NOTE — Patient Instructions (Addendum)

## 2016-02-24 NOTE — Progress Notes (Signed)
Patient ID: Tracy Bernard, female   DOB: [birth year], 82 y.o.   MRN: [id number]  82 y.o. G26P1001 Widowed  Caucasian Fe here for annual exam.  No new health problems other than recent squamous cell skin cancer removal.  She had Mohs' done on left top of head and removal of lesion on right arm.  She continues to be with same partner and they are rarely SA.  She has stopped the Premarin vaginal cream secondary to cost. She is having her kitchen upgraded and things at home are a mess!  No LMP recorded. Patient has had a hysterectomy.          Sexually active: No.  The current method of family planning is abstinence.    Exercising: No.  The patient does not participate in regular exercise at present. Smoker:  no  Health Maintenance: Pap: 04/2001 MMG: 06/11/15, [facility], normal (will call for report) Colonoscopy: 05/2009, repeat 5 years, PCP spoke with GI, pt was told will not need repeat due to age BMD:  07/20/13 T Score, -0.9 Left Femur Neck; Life Line Screening 09/2015 TDaP: 2011 Shingles: 08/2006 Prevnar 13: Fall 2015 Hep C and HIV: Not indicated due to age Labs: Dr. Addison Lank, PCP takes care of all labs   reports that she has never smoked. She has never used smokeless tobacco. She reports that she drinks about 1.0 oz of alcohol per week. She reports that she does not use illicit drugs.  Past Medical History  Diagnosis Date  . Hyperlipidemia   . Hypertension     Past Surgical History  Procedure Laterality Date  . Total vaginal hysterectomy  1981    secondary to uterine prolapse  . Breast biopsy Left 1977    benign  . Cholecystectomy, laparoscopic  2001  . Cataract extraction, bilateral Bilateral 2014    Current Outpatient Prescriptions  Medication Sig Dispense Refill  . aspirin EC 81 MG tablet Take 81 mg by mouth daily.    Marland Kitchen BIOTIN 5000 PO Take by mouth.    . Calcium Citrate-Vitamin D (CALCIUM CITRATE + D3) 200-250 MG-UNIT TABS Take 1 tablet by mouth daily.    . Cholecalciferol (VITAMIN  D PO) Take 1,000 Int'l Units by mouth daily.    . fexofenadine (ALLEGRA) 180 MG tablet Take 180 mg by mouth daily.    Marland Kitchen FIBER PO Take by mouth.    . fluticasone (FLONASE) 50 MCG/ACT nasal spray Place 2 sprays into the nose daily.    . folic acid (FOLVITE) 970 MCG tablet Take 400 mcg by mouth daily.    Marland Kitchen glucosamine-chondroitin 500-400 MG tablet Take 1 tablet by mouth 3 (three) times daily.    Marland Kitchen latanoprost (XALATAN) 0.005 % ophthalmic solution Place 1 drop into both eyes at bedtime.    Marland Kitchen losartan (COZAAR) 25 MG tablet Take 25 mg by mouth daily.    . Multiple Vitamin (MULTIVITAMIN) tablet Take 1 tablet by mouth daily.    . Omega-3 Fatty Acids (FISH OIL) 1200 MG CAPS Take 1 capsule by mouth daily.    Marland Kitchen omeprazole (PRILOSEC) 20 MG capsule Take 20 mg by mouth daily.    . Probiotic Product (PROBIOTIC DAILY PO) Take by mouth.    . simvastatin (ZOCOR) 20 MG tablet Take 20 mg by mouth every evening.    . vitamin B-12 (CYANOCOBALAMIN) 1000 MCG tablet Take 1,000 mcg by mouth daily.    . vitamin C (ASCORBIC ACID) 500 MG tablet Take 500 mg by mouth daily.    Marland Kitchen  vitamin E 400 UNIT capsule Take 400 Units by mouth daily.     No current facility-administered medications for this visit.    Family History  Problem Relation Age of Onset  . Breast cancer Sister   . Stroke Sister   . Colon cancer Daughter   . Anuerysm Daughter   . Kidney failure Mother   . Hypertension Mother   . Kidney failure Father   . Hypertension Father     ROS:  Pertinent items are noted in HPI.  Otherwise, a comprehensive ROS was negative.  Exam:   BP 124/78 mmHg  Pulse 64  Ht _0  (1.575 m)  Wt 130 lb (58.968 kg)  BMI 23.77 kg/m2 Height: _1  (157.5 cm) Ht Readings from Last 3 Encounters:  02/24/16 _2  (1.575 m)  12/17/14 _3  (1.575 m)  09/10/13 _4  (1.575 m)    General appearance: alert, cooperative and appears stated age Head: Normocephalic, without obvious abnormality, atraumatic Neck: no adenopathy,  supple, symmetrical, trachea midline and thyroid normal to inspection and palpation Lungs: clear to auscultation bilaterally Breasts: normal appearance, no masses or tenderness Heart: regular rate and rhythm Abdomen: soft, non-tender; no masses,  no organomegaly Extremities: extremities normal, atraumatic, no cyanosis or edema Skin: Skin color, texture, turgor normal. No rashes or lesions Lymph nodes: Cervical, supraclavicular, and axillary nodes normal. No abnormal inguinal nodes palpated Neurologic: Grossly normal   Pelvic: External genitalia:  no lesions              Urethra:  normal appearing urethra with no masses, tenderness or lesions              Bartholin's and Skene's: normal                 Vagina: normal appearing vagina with normal color and discharge, no lesions              Cervix: absent              Pap taken: No. Bimanual Exam:  Uterus:  uterus absent              Adnexa: no mass, fullness, tenderness               Rectovaginal: Confirms               Anus:  normal sphincter tone, no lesions  Chaperone present: no  A:  Well Woman with normal exam  S/P TVH secondary to prolapse 1981 and on ERT since about 1980's and stopped 2015 History of sleep disturbance off ERT - doing OK now Atrophic vaginitis - currently off vaginal estrogen Vit D deficiency now on OTC Vit D Thinning hair HTN, hypercholesterolemia    P:   Reviewed health and wellness pertinent to exam  Pap smear as above  Mammogram is due 06/2016  Will give her IFOB kit and follow  Counseled on breast self exam, mammography screening, adequate intake of calcium and vitamin D, diet and exercise, Kegel's exercises return annually or prn  An After Visit Summary was printed and given to the patient.

## 2016-02-24 NOTE — Progress Notes (Signed)
Reviewed personally.  M. Suzanne Jackson Coffield, MD.  

## 2016-03-16 DIAGNOSIS — J309 Allergic rhinitis, unspecified: Secondary | ICD-10-CM | POA: Diagnosis not present

## 2016-03-25 DIAGNOSIS — L509 Urticaria, unspecified: Secondary | ICD-10-CM | POA: Diagnosis not present

## 2016-03-25 DIAGNOSIS — Z85828 Personal history of other malignant neoplasm of skin: Secondary | ICD-10-CM | POA: Diagnosis not present

## 2016-04-06 LAB — FECAL OCCULT BLOOD, IMMUNOCHEMICAL: IMMUNOLOGICAL FECAL OCCULT BLOOD TEST: NEGATIVE

## 2016-04-06 NOTE — Addendum Note (Signed)
Addended by: Susanne Greenhouse E on: 04/06/2016 11:20 AM   Modules accepted: Orders

## 2016-04-27 DIAGNOSIS — N183 Chronic kidney disease, stage 3 (moderate): Secondary | ICD-10-CM | POA: Diagnosis not present

## 2016-04-27 DIAGNOSIS — Z1389 Encounter for screening for other disorder: Secondary | ICD-10-CM | POA: Diagnosis not present

## 2016-04-27 DIAGNOSIS — Z0000 Encounter for general adult medical examination without abnormal findings: Secondary | ICD-10-CM | POA: Diagnosis not present

## 2016-04-27 DIAGNOSIS — I10 Essential (primary) hypertension: Secondary | ICD-10-CM | POA: Diagnosis not present

## 2016-04-27 DIAGNOSIS — J309 Allergic rhinitis, unspecified: Secondary | ICD-10-CM | POA: Diagnosis not present

## 2016-04-27 DIAGNOSIS — Z86010 Personal history of colonic polyps: Secondary | ICD-10-CM | POA: Diagnosis not present

## 2016-04-27 DIAGNOSIS — E782 Mixed hyperlipidemia: Secondary | ICD-10-CM | POA: Diagnosis not present

## 2016-04-27 DIAGNOSIS — H409 Unspecified glaucoma: Secondary | ICD-10-CM | POA: Diagnosis not present

## 2016-04-27 DIAGNOSIS — K219 Gastro-esophageal reflux disease without esophagitis: Secondary | ICD-10-CM | POA: Diagnosis not present

## 2016-04-30 DIAGNOSIS — Z1389 Encounter for screening for other disorder: Secondary | ICD-10-CM | POA: Diagnosis not present

## 2016-04-30 DIAGNOSIS — R0989 Other specified symptoms and signs involving the circulatory and respiratory systems: Secondary | ICD-10-CM | POA: Diagnosis not present

## 2016-04-30 DIAGNOSIS — Z79899 Other long term (current) drug therapy: Secondary | ICD-10-CM | POA: Diagnosis not present

## 2016-04-30 DIAGNOSIS — Z7189 Other specified counseling: Secondary | ICD-10-CM | POA: Diagnosis not present

## 2016-04-30 DIAGNOSIS — K219 Gastro-esophageal reflux disease without esophagitis: Secondary | ICD-10-CM | POA: Diagnosis not present

## 2016-04-30 DIAGNOSIS — J309 Allergic rhinitis, unspecified: Secondary | ICD-10-CM | POA: Diagnosis not present

## 2016-04-30 DIAGNOSIS — Z5181 Encounter for therapeutic drug level monitoring: Secondary | ICD-10-CM | POA: Diagnosis not present

## 2016-04-30 DIAGNOSIS — I10 Essential (primary) hypertension: Secondary | ICD-10-CM | POA: Diagnosis not present

## 2016-04-30 DIAGNOSIS — Z0000 Encounter for general adult medical examination without abnormal findings: Secondary | ICD-10-CM | POA: Diagnosis not present

## 2016-04-30 DIAGNOSIS — G250 Essential tremor: Secondary | ICD-10-CM | POA: Diagnosis not present

## 2016-04-30 DIAGNOSIS — N183 Chronic kidney disease, stage 3 (moderate): Secondary | ICD-10-CM | POA: Diagnosis not present

## 2016-04-30 DIAGNOSIS — E782 Mixed hyperlipidemia: Secondary | ICD-10-CM | POA: Diagnosis not present

## 2016-05-17 DIAGNOSIS — H401131 Primary open-angle glaucoma, bilateral, mild stage: Secondary | ICD-10-CM | POA: Diagnosis not present

## 2016-06-10 DIAGNOSIS — L821 Other seborrheic keratosis: Secondary | ICD-10-CM | POA: Diagnosis not present

## 2016-06-10 DIAGNOSIS — Z85828 Personal history of other malignant neoplasm of skin: Secondary | ICD-10-CM | POA: Diagnosis not present

## 2016-06-10 DIAGNOSIS — L814 Other melanin hyperpigmentation: Secondary | ICD-10-CM | POA: Diagnosis not present

## 2016-06-10 DIAGNOSIS — L84 Corns and callosities: Secondary | ICD-10-CM | POA: Diagnosis not present

## 2016-06-10 DIAGNOSIS — D1801 Hemangioma of skin and subcutaneous tissue: Secondary | ICD-10-CM | POA: Diagnosis not present

## 2016-06-10 DIAGNOSIS — D485 Neoplasm of uncertain behavior of skin: Secondary | ICD-10-CM | POA: Diagnosis not present

## 2016-06-10 DIAGNOSIS — L089 Local infection of the skin and subcutaneous tissue, unspecified: Secondary | ICD-10-CM | POA: Diagnosis not present

## 2016-06-10 DIAGNOSIS — L820 Inflamed seborrheic keratosis: Secondary | ICD-10-CM | POA: Diagnosis not present

## 2016-06-14 DIAGNOSIS — Z1231 Encounter for screening mammogram for malignant neoplasm of breast: Secondary | ICD-10-CM | POA: Diagnosis not present

## 2016-06-14 DIAGNOSIS — Z803 Family history of malignant neoplasm of breast: Secondary | ICD-10-CM | POA: Diagnosis not present

## 2016-07-13 ENCOUNTER — Encounter: Payer: Self-pay | Admitting: Nurse Practitioner

## 2016-08-09 DIAGNOSIS — Z23 Encounter for immunization: Secondary | ICD-10-CM | POA: Diagnosis not present

## 2016-09-06 DIAGNOSIS — Z961 Presence of intraocular lens: Secondary | ICD-10-CM | POA: Diagnosis not present

## 2016-09-06 DIAGNOSIS — H52203 Unspecified astigmatism, bilateral: Secondary | ICD-10-CM | POA: Diagnosis not present

## 2016-09-06 DIAGNOSIS — H401131 Primary open-angle glaucoma, bilateral, mild stage: Secondary | ICD-10-CM | POA: Diagnosis not present

## 2016-09-06 DIAGNOSIS — H5213 Myopia, bilateral: Secondary | ICD-10-CM | POA: Diagnosis not present

## 2016-09-06 DIAGNOSIS — H524 Presbyopia: Secondary | ICD-10-CM | POA: Diagnosis not present

## 2016-11-11 DIAGNOSIS — Z85828 Personal history of other malignant neoplasm of skin: Secondary | ICD-10-CM | POA: Diagnosis not present

## 2016-11-11 DIAGNOSIS — L821 Other seborrheic keratosis: Secondary | ICD-10-CM | POA: Diagnosis not present

## 2016-11-11 DIAGNOSIS — L820 Inflamed seborrheic keratosis: Secondary | ICD-10-CM | POA: Diagnosis not present

## 2016-12-09 DIAGNOSIS — H401131 Primary open-angle glaucoma, bilateral, mild stage: Secondary | ICD-10-CM | POA: Diagnosis not present

## 2016-12-16 DIAGNOSIS — Z85828 Personal history of other malignant neoplasm of skin: Secondary | ICD-10-CM | POA: Diagnosis not present

## 2016-12-16 DIAGNOSIS — L905 Scar conditions and fibrosis of skin: Secondary | ICD-10-CM | POA: Diagnosis not present

## 2017-02-17 DIAGNOSIS — I10 Essential (primary) hypertension: Secondary | ICD-10-CM | POA: Diagnosis not present

## 2017-02-25 ENCOUNTER — Encounter: Payer: Self-pay | Admitting: Nurse Practitioner

## 2017-02-25 ENCOUNTER — Ambulatory Visit (INDEPENDENT_AMBULATORY_CARE_PROVIDER_SITE_OTHER): Payer: Medicare Other | Admitting: Nurse Practitioner

## 2017-02-25 VITALS — BP 124/76 | HR 72 | Ht 62.0 in | Wt 127.0 lb

## 2017-02-25 DIAGNOSIS — Z01411 Encounter for gynecological examination (general) (routine) with abnormal findings: Secondary | ICD-10-CM | POA: Diagnosis not present

## 2017-02-25 DIAGNOSIS — Z1212 Encounter for screening for malignant neoplasm of rectum: Secondary | ICD-10-CM | POA: Diagnosis not present

## 2017-02-25 DIAGNOSIS — Z1211 Encounter for screening for malignant neoplasm of colon: Secondary | ICD-10-CM

## 2017-02-25 DIAGNOSIS — N952 Postmenopausal atrophic vaginitis: Secondary | ICD-10-CM

## 2017-02-25 DIAGNOSIS — I10 Essential (primary) hypertension: Secondary | ICD-10-CM | POA: Diagnosis not present

## 2017-02-25 DIAGNOSIS — Z124 Encounter for screening for malignant neoplasm of cervix: Secondary | ICD-10-CM

## 2017-02-25 DIAGNOSIS — E559 Vitamin D deficiency, unspecified: Secondary | ICD-10-CM | POA: Diagnosis not present

## 2017-02-25 MED ORDER — NYSTATIN 100000 UNIT/GM EX CREA: 1.0000 "application " | TOPICAL_CREAM | Freq: Two times a day (BID) | CUTANEOUS | 0 refills | Status: DC

## 2017-02-25 NOTE — Patient Instructions (Signed)

## 2017-02-25 NOTE — Progress Notes (Signed)
Patient ID: Tracy Bernard, female   DOB: 05/21/1931, 83 y.o.   MRN: [id number]  83 y.o. G1P1001 Widowed  Caucasian Fe here for annual exam.  Still some pain right SI joint space.  Sees chiropractor once a month.  Has not had Vit D checked.  She feels well most of time.  There is a rash under right abdominal folds that occurs from time to time due to moisture.  Now is flared.  Still sees her friend but they are not SA.  No LMP recorded (lmp unknown). Patient has had a hysterectomy. 1981          Sexually active: No.  The current method of family planning is abstinence and status post hysterectomy.    Exercising: No.  The patient does not participate in regular exercise at present. Smoker:  no  Health Maintenance: Pap: 04/2001 (hysterectomy) History of Abnormal Pap: no MMG: 06/14/16, 3D, BI-Rads 2: Benign Self Breast exams: no Colonoscopy: 05/2009, repeat 5 years, PCP spoke with GI, pt was told will not need repeat due to age BMD: 07/20/13 T Score, -0.9 Left Femur Neck; Life Line Screening 09/2015 TDaP: 11/08/09 Shingles: 08/08/06 Pneumonia: 08/08/14, Prevnar-13 Hep C and HIV: Not indicated due to age Labs: PCP takes care of all labs   reports that she has never smoked. She has never used smokeless tobacco. She reports that she drinks about 1.0 oz of alcohol per week . She reports that she does not use drugs.  Past Medical History:  Diagnosis Date  . Hyperlipidemia   . Hypertension     Past Surgical History:  Procedure Laterality Date  . ABDOMINAL HYSTERECTOMY    . BREAST BIOPSY Left 1977   benign  . CATARACT EXTRACTION, BILATERAL Bilateral 2014  . CHOLECYSTECTOMY, LAPAROSCOPIC  2001  . TOTAL VAGINAL HYSTERECTOMY  1981   secondary to uterine prolapse    Current Outpatient Prescriptions  Medication Sig Dispense Refill  . aspirin EC 81 MG tablet Take 81 mg by mouth daily.    Marland Kitchen BIOTIN 5000 PO Take by mouth.    . Calcium Citrate-Vitamin D (CALCIUM CITRATE + D3) 200-250 MG-UNIT TABS Take  1 tablet by mouth daily.    . Cholecalciferol (VITAMIN D PO) Take 1,000 Int'l Units by mouth daily.    . fexofenadine (ALLEGRA) 180 MG tablet Take 180 mg by mouth daily.    Marland Kitchen FIBER PO Take by mouth.    . fluticasone (FLONASE) 50 MCG/ACT nasal spray Place 2 sprays into the nose daily.    . folic acid (FOLVITE) 697 MCG tablet Take 400 mcg by mouth daily.    Marland Kitchen glucosamine-chondroitin 500-400 MG tablet Take 1 tablet by mouth 3 (three) times daily.    Marland Kitchen latanoprost (XALATAN) 0.005 % ophthalmic solution Place 1 drop into both eyes at bedtime.    Marland Kitchen losartan (COZAAR) 25 MG tablet Take 25 mg by mouth daily.    . Multiple Vitamin (MULTIVITAMIN) tablet Take 1 tablet by mouth daily.    . Omega-3 Fatty Acids (FISH OIL) 1200 MG CAPS Take 1 capsule by mouth daily.    Marland Kitchen omeprazole (PRILOSEC) 20 MG capsule Take 20 mg by mouth daily.    . Probiotic Product (PROBIOTIC DAILY PO) Take by mouth.    . simvastatin (ZOCOR) 20 MG tablet Take 20 mg by mouth every evening.    . vitamin B-12 (CYANOCOBALAMIN) 1000 MCG tablet Take 1,000 mcg by mouth daily.    . vitamin C (ASCORBIC ACID) 500 MG tablet Take  500 mg by mouth daily.    . vitamin E 400 UNIT capsule Take 400 Units by mouth daily.    Marland Kitchen nystatin cream (MYCOSTATIN) Apply 1 application topically 2 (two) times daily. Apply to affected area BID for up to 7 days. 30 g 0   No current facility-administered medications for this visit.     Family History  Problem Relation Age of Onset  . Breast cancer Sister   . Stroke Sister   . Kidney failure Mother   . Hypertension Mother   . Kidney failure Father   . Hypertension Father   . Colon cancer Daughter 53    resection of colon, had chemo and is now cancer free. at age 49 -2018  . Anuerysm Daughter 67  . Diabetes Daughter 79    treaated with oral med    ROS:  Pertinent items are noted in HPI.  Otherwise, a comprehensive ROS was negative.  Exam:   BP 124/76 (BP Location: Left Arm, Patient Position: Sitting, Cuff  Size: Normal)   Pulse 72   Ht 5\' 2"  (1.575 m)   Wt 127 lb (57.6 kg)   LMP  (LMP Unknown)   BMI 23.23 kg/m  Height: 5\' 2"  (157.5 cm) Ht Readings from Last 3 Encounters:  02/25/17 5\' 2"  (1.575 m)  02/24/16 5\' 2"  (1.575 m)  12/17/14 5\' 2"  (1.575 m)    General appearance: alert, cooperative and appears stated age Head: Normocephalic, without obvious abnormality, atraumatic Neck: no adenopathy, supple, symmetrical, trachea midline and thyroid normal to inspection and palpation Lungs: clear to auscultation bilaterally Breasts: normal appearance, no masses or tenderness Heart: regular rate and rhythm Abdomen: soft, non-tender; no masses,  no organomegaly Extremities: extremities normal, atraumatic, no cyanosis or edema Skin: Skin color, texture, turgor normal. No rashes or lesions, yeast dermatitis under skin folds of right abdomen.   Lymph nodes: Cervical, supraclavicular, and axillary nodes normal. No abnormal inguinal nodes palpated Neurologic: Grossly normal   Pelvic: External genitalia:  no lesions              Urethra:  normal appearing urethra with no masses, tenderness or lesions              Bartholin's and Skene's: normal                 Vagina: normal appearing vagina with normal color and discharge, no lesions              Cervix: absent              Pap taken: No. Bimanual Exam:  Uterus:  uterus absent              Adnexa: no mass, fullness, tenderness               Rectovaginal: Confirms               Anus:  normal sphincter tone, no lesions  Chaperone present: yes  A:  Well Woman with normal exam      S/P TVH secondary to prolapse 1981 and on ERT since about 1980's and stopped 2015 Atrophic vaginitis - currently off vaginal estrogen for several yrs Vit D deficiency now on OTC Vit D Right SI joint pain HTN, hypercholesterolemia  Yeast dermatitis  FMH: of colon cancer - daughter age 83   P:    Reviewed health and wellness pertinent to exam  Pap smear: no  Mammogram is due 06/2017  IFOB is given  Follow with Vit D results  Rx for Nystatin cream to use prn for yeast  Counseled on breast self exam, mammography screening, adequate intake of calcium and vitamin D, diet and exercise, Kegel's exercises return annually or prn  An After Visit Summary was printed and given to the patient.

## 2017-02-26 LAB — VITAMIN D 25 HYDROXY (VIT D DEFICIENCY, FRACTURES): VIT D 25 HYDROXY: 53 ng/mL (ref 30–100)

## 2017-02-27 NOTE — Progress Notes (Signed)
Encounter reviewed by Dr. Lorelai Huyser Amundson C. Silva.  

## 2017-03-02 LAB — FECAL OCCULT BLOOD, IMMUNOCHEMICAL: IFOBT: NEGATIVE

## 2017-03-02 NOTE — Addendum Note (Signed)
Addended by: Terence Lux A on: 03/02/2017 11:58 AM   Modules accepted: Orders

## 2017-03-10 DIAGNOSIS — H401131 Primary open-angle glaucoma, bilateral, mild stage: Secondary | ICD-10-CM | POA: Diagnosis not present

## 2017-04-14 DIAGNOSIS — H401131 Primary open-angle glaucoma, bilateral, mild stage: Secondary | ICD-10-CM | POA: Diagnosis not present

## 2017-05-04 DIAGNOSIS — Z79899 Other long term (current) drug therapy: Secondary | ICD-10-CM | POA: Diagnosis not present

## 2017-05-04 DIAGNOSIS — K219 Gastro-esophageal reflux disease without esophagitis: Secondary | ICD-10-CM | POA: Diagnosis not present

## 2017-05-04 DIAGNOSIS — J309 Allergic rhinitis, unspecified: Secondary | ICD-10-CM | POA: Diagnosis not present

## 2017-05-04 DIAGNOSIS — Z7189 Other specified counseling: Secondary | ICD-10-CM | POA: Diagnosis not present

## 2017-05-04 DIAGNOSIS — Z1389 Encounter for screening for other disorder: Secondary | ICD-10-CM | POA: Diagnosis not present

## 2017-05-04 DIAGNOSIS — I10 Essential (primary) hypertension: Secondary | ICD-10-CM | POA: Diagnosis not present

## 2017-05-04 DIAGNOSIS — M533 Sacrococcygeal disorders, not elsewhere classified: Secondary | ICD-10-CM | POA: Diagnosis not present

## 2017-05-04 DIAGNOSIS — E782 Mixed hyperlipidemia: Secondary | ICD-10-CM | POA: Diagnosis not present

## 2017-05-04 DIAGNOSIS — G8929 Other chronic pain: Secondary | ICD-10-CM | POA: Diagnosis not present

## 2017-05-04 DIAGNOSIS — Z0000 Encounter for general adult medical examination without abnormal findings: Secondary | ICD-10-CM | POA: Diagnosis not present

## 2017-05-04 DIAGNOSIS — H409 Unspecified glaucoma: Secondary | ICD-10-CM | POA: Diagnosis not present

## 2017-05-04 DIAGNOSIS — Z23 Encounter for immunization: Secondary | ICD-10-CM | POA: Diagnosis not present

## 2017-05-16 DIAGNOSIS — H401131 Primary open-angle glaucoma, bilateral, mild stage: Secondary | ICD-10-CM | POA: Diagnosis not present

## 2017-05-30 ENCOUNTER — Ambulatory Visit: Payer: Medicare Other | Attending: Family Medicine | Admitting: Rehabilitation

## 2017-05-30 ENCOUNTER — Encounter: Payer: Self-pay | Admitting: Rehabilitation

## 2017-05-30 DIAGNOSIS — M6281 Muscle weakness (generalized): Secondary | ICD-10-CM | POA: Diagnosis not present

## 2017-05-30 DIAGNOSIS — G8929 Other chronic pain: Secondary | ICD-10-CM | POA: Diagnosis not present

## 2017-05-30 DIAGNOSIS — M5441 Lumbago with sciatica, right side: Secondary | ICD-10-CM | POA: Diagnosis not present

## 2017-05-30 NOTE — Therapy (Signed)
[facility] [address] [place], [place], [address] Phone: [phone]   Fax:  [phone]  Physical Therapy Evaluation  Patient Details  Name: Tracy Bernard MRN: [id number] Date of Birth: [birth year] Referring Provider: Theadore Nan, MD  Encounter Date: 05/30/2017      PT End of Session - 05/30/17 0932    Visit Number 1   Number of Visits 12   Date for PT Re-Evaluation 07/11/17   PT Start Time 0845   PT Stop Time 0930   PT Time Calculation (min) 45 min   Activity Tolerance Patient tolerated treatment well      Past Medical History:  Diagnosis Date  . Hyperlipidemia   . Hypertension     Past Surgical History:  Procedure Laterality Date  . ABDOMINAL HYSTERECTOMY    . BREAST BIOPSY Left 1977   benign  . CATARACT EXTRACTION, BILATERAL Bilateral 2014  . CHOLECYSTECTOMY, LAPAROSCOPIC  2001  . TOTAL VAGINAL HYSTERECTOMY  1981   secondary to uterine prolapse    There were no vitals filed for this visit.       Subjective Assessment - 05/30/17 0848    Subjective Pt presents with LBP and R leg pain into the ankle x 5 years.  Was here before for the same problem and it worked but never went completely away.     Pertinent History skin cancer 1 year ago    Limitations Standing;Walking   How long can you stand comfortably? the afternoon is bad   How long can you walk comfortably? the afternoon is bad, fine in the morning.  Then is hurts with any walking.     Diagnostic tests no recent xrays or MRIs   Patient Stated Goals decrease the pain   Currently in Pain? No/denies   Pain Score --  pain up to 9/10 in the afternoon when on her feet   Pain Location Back   Pain Orientation Right   Pain Descriptors / Indicators Sharp   Pain Type Chronic pain   Pain Radiating Towards L leg; with occasional numbness and tingling    Pain Onset More than a month ago   Pain Frequency Intermittent   Aggravating Factors   walking    Pain Relieving Factors flexion, rest,    Effect of Pain on Daily Activities once the pain starts it just hurts, has to rest            [facility] PT Assessment - 05/30/17 0001      Assessment   Medical Diagnosis Lumbar Pain   Referring Provider Theadore Nan, MD   Onset Date/Surgical Date 05/08/13   Next MD Visit as needed   Prior Therapy yes     Precautions   Precautions None     Restrictions   Weight Bearing Restrictions No     Balance Screen   Has the patient fallen in the past 6 months Yes   How many times? 1   Has the patient had a decrease in activity level because of a fear of falling?  No   Is the patient reluctant to leave their home because of a fear of falling?  No     Home Environment   Living Environment Private residence   Living Arrangements Alone   Type of [place] to live on main level with bedroom/bathroom   Additional Comments unable to do much housework due to the back     Prior Function  Level of Independence Independent     Observation/Other Assessments   Focus on Therapeutic Outcomes (FOTO)  42% limited     Sensation   Light Touch Appears Intact     Functional Tests   Functional tests Sit to Stand;Step up;Step down     Sit to Stand   Comments WNL; x 5 WNL     Posture/Postural Control   Posture/Postural Control Postural limitations   Postural Limitations Rounded Shoulders;Forward head;Increased thoracic kyphosis   Posture Comments hips equal in standing     ROM / Strength   AROM / PROM / Strength AROM;Strength     AROM   AROM Assessment Site Lumbar   Lumbar Flexion full to the floor   Lumbar Extension 25%   Lumbar - Right Side Bend 75%* pain reproduced   Lumbar - Left Side Bend full   Lumbar - Right Rotation 50%   Lumbar - Left Rotation 50%     Strength   Strength Assessment Site Knee;Hip   Right/Left Hip Right;Left   Right Hip Flexion 4-/5   Right Hip External Rotation  4-/5   Right Hip  Internal Rotation 4-/5   Right Hip ABduction 4-/5   Left Hip Flexion 4-/5   Left Hip External Rotation 4-/5   Left Hip Internal Rotation 4-/5   Left Hip ABduction 4-/5   Right/Left Knee Right;Left   Right Knee Flexion 4+/5   Right Knee Extension 4/5   Left Knee Flexion 4+/5   Left Knee Extension 4/5     Flexibility   Soft Tissue Assessment /Muscle Length yes   Hamstrings to 80bil   Piriformis mild tightness bil     Palpation   Spinal mobility decreased overall but no pain reproduced   Palpation comment R PSIS tenderness +2 and R lumbar paraspinals and piriformis +1 ttp      Ambulation/Gait   Gait Pattern Decreased arm swing - left            Objective measurements completed on examination: See above findings.          [facility] Adult PT Treatment/Exercise - 05/30/17 0001      Exercises   Exercises Lumbar     Lumbar Exercises: Stretches   Lower Trunk Rotation 3 reps;10 seconds   Lower Trunk Rotation Limitations bilateral     Lumbar Exercises: Supine   Ab Set 5 reps;5 seconds   Bridge 5 reps                PT Education - 05/30/17 0932    Education provided Yes   Education Details Diagnosis, POC   Person(s) Educated Patient   Methods Explanation;Demonstration;Handout;Verbal cues;Tactile cues   Comprehension Verbalized understanding;Returned demonstration;Need further instruction;Verbal cues required          PT Short Term Goals - 05/30/17 1147      PT SHORT TERM GOAL #1   Title pt will be ind with initial HEP   Time 1   Period Weeks   Status New   Target Date 06/06/17           PT Long Term Goals - 05/30/17 1147      PT LONG TERM GOAL #1   Title pt will decrease R sided back pain to no more than 5/10 at the worst   Baseline 9/10   Time 6   Period Weeks   Status New   Target Date 07/11/17     PT LONG TERM GOAL #2   Title pt  will perform painfree R lumbar lateral flexion   Time 6   Period Weeks   Status New   Target Date  07/11/17     PT LONG TERM GOAL #3   Title pt will be ind with final HEP   Time 6   Period Weeks   Status New   Target Date 07/11/17                Plan - 05/30/17 0958    Clinical Impression Statement Pt presents with R sided low back pain over the PSIS and into the R back of the leg x 5 years.  Reports she was here about 4 years ago for the same issue and had some benefit with TE and possibly traction while here.  Pain is not present in the morning and worsens only with too much activity.  Pain was reproduced only with R sidebend today and tenderness to palpation over the R PSIS and slightly into the R lumbar paraspinals/QL.  Pt was not in pain today but will test response to flexion vs extension when in pain this afternoon.  General mild hip weakness present with reports of poor balance.     Clinical Presentation Stable   Clinical Decision Making Low   Rehab Potential Good   PT Frequency 2x / week   PT Duration 6 weeks   PT Treatment/Interventions ADLs/Self Care Home Management;Electrical Stimulation;Traction;Moist Heat;Neuromuscular re-education;Therapeutic exercise;Therapeutic activities;Patient/family education;Manual techniques;Taping   PT Next Visit Plan ask pt about flexion vs extension preference when it was hurting; add exercises based on this.  STM/joint mob, R sided opening, general hip and core strengthening, traction?   Consulted and Agree with Plan of Care Patient      Patient will benefit from skilled therapeutic intervention in order to improve the following deficits and impairments:  Decreased mobility, Difficulty walking, Pain  Visit Diagnosis: Chronic right-sided low back pain with right-sided sciatica - Plan: PT plan of care cert/re-cert  Muscle weakness (generalized) - Plan: PT plan of care cert/re-cert      G-Codes - 62/94/76 1152    Functional Assessment Tool Used (Outpatient Only) FOTO 42%   Functional Limitation Mobility: Walking and moving around    Mobility: Walking and Moving Around Current Status (L4650) At least 40 percent but less than 60 percent impaired, limited or restricted   Mobility: Walking and Moving Around Goal Status [phone]) At least 20 percent but less than 40 percent impaired, limited or restricted       Problem List Patient Active Problem List   Diagnosis Date Noted  . Vitamin D deficiency 12/17/2014  . Atrophic vaginitis 12/17/2014  . HTN (hypertension) 09/10/2013  . Hypercholesterolemia 09/10/2013  . Postmenopausal HRT (hormone replacement therapy) 09/10/2013    Stark Bray 05/30/2017, 11:54 AM  [facility] [address], [place], [place], [address] Phone: [phone]   Fax:  [phone]  Name: ALYN RIEDINGER MRN: [id number] Date of Birth: [birth year]

## 2017-06-01 ENCOUNTER — Ambulatory Visit: Payer: Medicare Other | Admitting: Physical Therapy

## 2017-06-01 ENCOUNTER — Encounter: Payer: Self-pay | Admitting: Physical Therapy

## 2017-06-01 DIAGNOSIS — M5441 Lumbago with sciatica, right side: Principal | ICD-10-CM

## 2017-06-01 DIAGNOSIS — M6281 Muscle weakness (generalized): Secondary | ICD-10-CM | POA: Diagnosis not present

## 2017-06-01 DIAGNOSIS — G8929 Other chronic pain: Secondary | ICD-10-CM

## 2017-06-01 NOTE — Therapy (Signed)
[facility] [address], [place], [place], [address] Phone: [phone]   Fax:  [phone]  Physical Therapy Treatment  Patient Details  Name: Tracy Bernard MRN: [id number] Date of Birth: 18-May-1931 Referring Provider: Theadore Nan, MD  Encounter Date: 06/01/2017      PT End of Session - 06/01/17 0758    Visit Number 2   Number of Visits 12   Date for PT Re-Evaluation 07/11/17   PT Start Time 0758   PT Stop Time 0841   PT Time Calculation (min) 43 min   Activity Tolerance Patient tolerated treatment well   Behavior During Therapy [facility] for tasks assessed/performed      Past Medical History:  Diagnosis Date  . Hyperlipidemia   . Hypertension     Past Surgical History:  Procedure Laterality Date  . ABDOMINAL HYSTERECTOMY    . BREAST BIOPSY Left 1977   benign  . CATARACT EXTRACTION, BILATERAL Bilateral 2014  . CHOLECYSTECTOMY, LAPAROSCOPIC  2001  . TOTAL VAGINAL HYSTERECTOMY  1981   secondary to uterine prolapse    There were no vitals filed for this visit.      Subjective Assessment - 06/01/17 0803    Subjective No pain this AM. When she had pain yesterday she tried flexion vs extension and neither provoked pain or relieved pain.    Currently in Pain? No/denies   Multiple Pain Sites No                         [facility] Adult PT Treatment/Exercise - 06/01/17 0001      Therapeutic Activites    Therapeutic Activities --  Sitting and standing posture: tall walking/ creating space     Lumbar Exercises: Stretches   Active Hamstring Stretch 3 reps;20 seconds   Single Knee to Chest Stretch 2 reps;20 seconds   Lower Trunk Rotation 3 reps;10 seconds  bil   Piriformis Stretch 3 reps;20 seconds  bil     Lumbar Exercises: Aerobic   Stationary Bike Nustep L1 x 6 min  PTA present, gave pt lumbar roll for support     Lumbar Exercises: Supine   Bridge 5 reps;3 seconds   Other Supine  Lumbar Exercises Leg lengthener stretch bil 3 x 5 sec                PT Education - 06/01/17 0816    Education provided Yes   Education Details Hip stretches for HEP   Person(s) Educated Patient   Methods Explanation;Demonstration;Tactile cues;Verbal cues;Handout   Comprehension Verbalized understanding;Returned demonstration          PT Short Term Goals - 05/30/17 1147      PT SHORT TERM GOAL #1   Title pt will be ind with initial HEP   Time 1   Period Weeks   Status New   Target Date 06/06/17           PT Long Term Goals - 05/30/17 1147      PT LONG TERM GOAL #1   Title pt will decrease R sided back pain to no more than 5/10 at the worst   Baseline 9/10   Time 6   Period Weeks   Status New   Target Date 07/11/17     PT LONG TERM GOAL #2   Title pt will perform painfree R lumbar lateral flexion   Time 6   Period Weeks   Status New  Target Date 07/11/17     PT LONG TERM GOAL #3   Title pt will be ind with final HEP   Time 6   Period Weeks   Status New   Target Date 07/11/17               Plan - 06/01/17 0758    Clinical Impression Statement Pt reports that neither lumbar flexion or extension provoked or relieve pain per her report. She is compliant with her initial HEP and reports taking frequent rest brealks throughout the day. Added hip stretches, including hamstring stretches to HEP today.    Rehab Potential Good   PT Frequency 2x / week   PT Duration 6 weeks   PT Treatment/Interventions ADLs/Self Care Home Management;Electrical Stimulation;Traction;Moist Heat;Neuromuscular re-education;Therapeutic exercise;Therapeutic activities;Patient/family education;Manual techniques;Taping   PT Next Visit Plan Review stretches, begin core strengtheningin supine.    Consulted and Agree with Plan of Care Patient      Patient will benefit from skilled therapeutic intervention in order to improve the following deficits and impairments:  Decreased  mobility, Difficulty walking, Pain  Visit Diagnosis: Chronic right-sided low back pain with right-sided sciatica  Muscle weakness (generalized)     Problem List Patient Active Problem List   Diagnosis Date Noted  . Vitamin D deficiency 12/17/2014  . Atrophic vaginitis 12/17/2014  . HTN (hypertension) 09/10/2013  . Hypercholesterolemia 09/10/2013  . Postmenopausal HRT (hormone replacement therapy) 09/10/2013    Tracy Bernard, PTA 06/01/2017, 8:41 AM  [facility] [address] [place], [place], [address] Phone: [phone]   Fax:  [phone]  Name: Tracy Bernard MRN: [id number] Date of Birth: Aug 02, 1931

## 2017-06-01 NOTE — Patient Instructions (Signed)
Knee-to-Chest Stretch: Unilateral    With hand on top of the knee, pull knee in to opposite shoulder  until a comfortable stretch is felt in your hip/buttocks area.  Keep back relaxed. Hold _20___ seconds or 4 slow breathes. Repeat _3___ times per set. Do 1-2____ sessions per day.  http://orth.exer.us/127   Hamstring Stretch NO BALL!! Sit on solid seat.   Inhale and straighten spine. Exhale and lean forward toward extended leg. Hold position for __3_ breaths. Inhale and come back to center. Repeat with other leg extended. Repeat _3__ times, alternating legs. Do __1-3_ times per day.  Copyright  VHI. All rights reserved.    Stretch your leg out straight in the bed, toes back to the knee and stretch the leg long. Hold 5 sec, then repeat on the other leg. Keep the other knee bent. This will support your back.    Copyright  VHI. All rights reserved.      Copyright  VHI. All rights reserved.

## 2017-06-07 ENCOUNTER — Ambulatory Visit: Payer: Medicare Other

## 2017-06-07 ENCOUNTER — Telehealth: Payer: Self-pay | Admitting: Obstetrics & Gynecology

## 2017-06-07 DIAGNOSIS — M6281 Muscle weakness (generalized): Secondary | ICD-10-CM | POA: Diagnosis not present

## 2017-06-07 DIAGNOSIS — M5441 Lumbago with sciatica, right side: Secondary | ICD-10-CM | POA: Diagnosis not present

## 2017-06-07 DIAGNOSIS — G8929 Other chronic pain: Secondary | ICD-10-CM

## 2017-06-07 NOTE — Telephone Encounter (Signed)
Left message for patient to call and reschedule Tracy Bernard appointment °

## 2017-06-07 NOTE — Therapy (Signed)
[facility] [address], [place], [place], [address] Phone: [phone]   Fax:  [phone]  Physical Therapy Treatment  Patient Details  Name: Tracy Bernard MRN: [id number] Date of Birth: [birth year] Referring Provider: Theadore Nan, MD  Encounter Date: 06/07/2017      PT End of Session - 06/07/17 1408    Visit Number 3   Number of Visits 12   Date for PT Re-Evaluation 07/11/17   PT Start Time 1400   PT Stop Time 1443   PT Time Calculation (min) 43 min   Activity Tolerance Patient tolerated treatment well   Behavior During Therapy [facility] for tasks assessed/performed      Past Medical History:  Diagnosis Date  . Hyperlipidemia   . Hypertension     Past Surgical History:  Procedure Laterality Date  . ABDOMINAL HYSTERECTOMY    . BREAST BIOPSY Left 1977   benign  . CATARACT EXTRACTION, BILATERAL Bilateral 2014  . CHOLECYSTECTOMY, LAPAROSCOPIC  2001  . TOTAL VAGINAL HYSTERECTOMY  1981   secondary to uterine prolapse    There were no vitals filed for this visit.      Subjective Assessment - 06/07/17 1443    Subjective Pt. doing well today noting "I haven't been hurting today.  Pt. reports performing all HEP activities daily.     Patient Stated Goals decrease the pain   Currently in Pain? No/denies   Pain Score 0-No pain   Multiple Pain Sites No                         [facility] Adult PT Treatment/Exercise - 06/07/17 1420      Exercises   Exercises Knee/Hip     Lumbar Exercises: Stretches   Passive Hamstring Stretch 2 reps;20 seconds   Passive Hamstring Stretch Limitations bil; heel on 6" step   Single Knee to Chest Stretch 2 reps;20 seconds   Lower Trunk Rotation 10 seconds;5 reps   Piriformis Stretch 3 reps;20 seconds     Lumbar Exercises: Aerobic   Stationary Bike Nustep L2 x 8 min     Lumbar Exercises: Supine   Ab Set 10 reps   AB Set Limitations 5" hold with sustained hip  abd/ER with red band around knees    Clam 10 reps;3 seconds   Clam Limitations Hooklying with red TB at knees    Bent Knee Raise 3 seconds;10 reps   Bent Knee Raise Limitations Hooklying with red TB at knees    Bridge 15 reps;3 seconds   Bridge Limitations with sustained hip abd/ER with red band at knees    Other Supine Lumbar Exercises Hooklying abdom. brace march with red band x 10 reps each side      Lumbar Exercises: Sidelying   Clam 15 reps;3 seconds   Clam Limitations B; red TB at knees      Knee/Hip Exercises: Standing   Heel Raises Both;20 reps   Hip Abduction Right;Left;10 reps;Knee straight   Abduction Limitations at counter                   PT Short Term Goals - 06/07/17 1412      PT SHORT TERM GOAL #1   Title pt will be ind with initial HEP   Time 1   Period Weeks   Status Achieved           PT Long Term Goals - 06/07/17 2633  PT LONG TERM GOAL #1   Title pt will decrease R sided back pain to no more than 5/10 at the worst   Baseline 9/10   Time 6   Period Weeks   Status On-going     PT LONG TERM GOAL #2   Title pt will perform painfree R lumbar lateral flexion   Time 6   Period Weeks   Status On-going     PT LONG TERM GOAL #3   Title pt will be ind with final HEP   Time 6   Period Weeks   Status On-going               Plan - 06/07/17 1413    Clinical Impression Statement Pt. doing well today.  Tolerated all hip/core strengthening activities well today without issue.  Reports daily adherence to current HEP.  Minor cueing required with seated HS stretch for hold times and pressure through LE being stretched.  Seems to be progressing well.     PT Treatment/Interventions ADLs/Self Care Home Management;Electrical Stimulation;Traction;Moist Heat;Neuromuscular re-education;Therapeutic exercise;Therapeutic activities;Patient/family education;Manual techniques;Taping   PT Next Visit Plan progression of core strengthening        Patient will benefit from skilled therapeutic intervention in order to improve the following deficits and impairments:  Decreased mobility, Difficulty walking, Pain  Visit Diagnosis: Chronic right-sided low back pain with right-sided sciatica  Muscle weakness (generalized)     Problem List Patient Active Problem List   Diagnosis Date Noted  . Vitamin D deficiency 12/17/2014  . Atrophic vaginitis 12/17/2014  . HTN (hypertension) 09/10/2013  . Hypercholesterolemia 09/10/2013  . Postmenopausal HRT (hormone replacement therapy) 09/10/2013    Bess Harvest, PTA 06/07/17 5:29 PM  [facility] [address], [place], [place], [address] Phone: [phone]   Fax:  [phone]  Name: Tracy Bernard MRN: [id number] Date of Birth: [birth year]

## 2017-06-07 NOTE — Telephone Encounter (Signed)
Patient left message that she will not be rescheduling her [address] appointment with our office.

## 2017-06-13 ENCOUNTER — Encounter: Payer: Self-pay | Admitting: Physical Therapy

## 2017-06-13 ENCOUNTER — Ambulatory Visit: Payer: Medicare Other | Attending: Family Medicine | Admitting: Physical Therapy

## 2017-06-13 DIAGNOSIS — M6281 Muscle weakness (generalized): Secondary | ICD-10-CM | POA: Diagnosis not present

## 2017-06-13 DIAGNOSIS — M5441 Lumbago with sciatica, right side: Secondary | ICD-10-CM | POA: Insufficient documentation

## 2017-06-13 DIAGNOSIS — G8929 Other chronic pain: Secondary | ICD-10-CM | POA: Insufficient documentation

## 2017-06-13 NOTE — Therapy (Signed)
[facility] [address], [place], [place], [address] Phone: [phone]   Fax:  [phone]  Physical Therapy Treatment  Patient Details  Name: Tracy Bernard MRN: [id number] Date of Birth: 10-05-1931 Referring Provider: Theadore Nan, MD  Encounter Date: 06/13/2017      PT End of Session - 06/13/17 0855    Visit Number 4   Number of Visits 12   Date for PT Re-Evaluation 07/11/17   Authorization Type Medicare g-code on 10th visit; KX modifier on 15th visit   PT Start Time 0845   PT Stop Time 0925   PT Time Calculation (min) 40 min   Activity Tolerance Patient tolerated treatment well   Behavior During Therapy [facility] for tasks assessed/performed      Past Medical History:  Diagnosis Date  . Hyperlipidemia   . Hypertension     Past Surgical History:  Procedure Laterality Date  . ABDOMINAL HYSTERECTOMY    . BREAST BIOPSY Left 1977   benign  . CATARACT EXTRACTION, BILATERAL Bilateral 2014  . CHOLECYSTECTOMY, LAPAROSCOPIC  2001  . TOTAL VAGINAL HYSTERECTOMY  1981   secondary to uterine prolapse    There were no vitals filed for this visit.      Subjective Assessment - 06/13/17 0851    Subjective This morning my back is feeling okay.  Late afternoon my back will hurt if I have been on my feet.  Numbness and tingling in leg is 50% better.    Pertinent History skin cancer 1 year ago    Limitations Standing;Walking   How long can you stand comfortably? the afternoon is bad   How long can you walk comfortably? the afternoon is bad, fine in the morning.  Then is hurts with any walking.     Diagnostic tests no recent xrays or MRIs   Patient Stated Goals decrease the pain   Currently in Pain? Yes   Pain Score 8    Pain Location Back   Pain Orientation Right   Pain Descriptors / Indicators Sharp   Pain Type Chronic pain   Pain Radiating Towards left leg; with occasional numbness and tingling   Pain Onset More  than a month ago   Pain Frequency Intermittent   Aggravating Factors  walking   Pain Relieving Factors flexion, rest   Effect of Pain on Daily Activities once the pain starts it just hurts, has to rest   Multiple Pain Sites No            [facility] PT Assessment - 06/13/17 0001      Strength   Right Hip Flexion 4/5   Right Hip External Rotation  5/5   Right Hip Internal Rotation 5/5   Left Hip Flexion 4/5   Left Hip External Rotation 5/5   Left Hip Internal Rotation 5/5                     [facility] Adult PT Treatment/Exercise - 06/13/17 0001      Lumbar Exercises: Stretches   Active Hamstring Stretch 3 reps;20 seconds   Single Knee to Chest Stretch 2 reps;20 seconds   Piriformis Stretch 3 reps;20 seconds     Lumbar Exercises: Aerobic   Stationary Bike Nustep L2 x 8 min; seat #6; arm #11     Lumbar Exercises: Supine   Ab Set 10 reps   AB Set Limitations 5" hold with sustained hip abd/ER with red band around knees  Clam 10 reps;3 seconds   Clam Limitations Hooklying with red TB at knees    Bent Knee Raise 3 seconds;10 reps   Bent Knee Raise Limitations Hooklying with red TB at knees    Bridge 15 reps;3 seconds   Bridge Limitations with sustained hip abd/ER with red band at knees      Lumbar Exercises: Sidelying   Clam 15 reps;3 seconds   Clam Limitations B; red TB at knees      Knee/Hip Exercises: Standing   Heel Raises Both;20 reps   Hip Abduction Right;Left;10 reps;Knee straight   Abduction Limitations at counter                   PT Short Term Goals - 06/13/17 0901      PT SHORT TERM GOAL #1   Title pt will be ind with initial HEP   Time 1   Period Weeks   Status Achieved           PT Long Term Goals - 06/13/17 0901      PT LONG TERM GOAL #1   Title pt will decrease R sided back pain to no more than 5/10 at the worst   Baseline 8/10   Time 6   Period Weeks   Status On-going     PT LONG TERM GOAL #2   Title pt will perform  painfree R lumbar lateral flexion   Time 6   Period Weeks   Status On-going     PT LONG TERM GOAL #3   Title pt will be ind with final HEP   Time 6   Period Weeks   Status On-going               Plan - 06/13/17 6294    Clinical Impression Statement Patient reports her numbness and tingling is better by 50%. Patient pain is 8/10 instead of 10/10. Bilateral hip flexion and rotators strength increased. Patient was able to exercise without increased pain.  Patient needed tactile cues to brace abdominals with exercises. Patient will benefit from skilled therapy to improve strength and reducing pain.    Rehab Potential Good   PT Frequency 2x / week   PT Duration 6 weeks   PT Treatment/Interventions ADLs/Self Care Home Management;Electrical Stimulation;Traction;Moist Heat;Neuromuscular re-education;Therapeutic exercise;Therapeutic activities;Patient/family education;Manual techniques;Taping   PT Next Visit Plan progression of core strengthening    PT Home Exercise Plan progress as needed   Recommended Other Services initial cert signed on 7/65 by MD   Consulted and Agree with Plan of Care Patient      Patient will benefit from skilled therapeutic intervention in order to improve the following deficits and impairments:  Decreased mobility, Difficulty walking, Pain  Visit Diagnosis: Chronic right-sided low back pain with right-sided sciatica  Muscle weakness (generalized)     Problem List Patient Active Problem List   Diagnosis Date Noted  . Vitamin D deficiency 12/17/2014  . Atrophic vaginitis 12/17/2014  . HTN (hypertension) 09/10/2013  . Hypercholesterolemia 09/10/2013  . Postmenopausal HRT (hormone replacement therapy) 09/10/2013    Earlie Counts, PT 06/13/17 9:27 AM   [facility] [address] [place], [place], [address] Phone: [phone]   Fax:  [phone]  Name: Tracy Bernard MRN:  [id number] Date of Birth: 1931-09-19

## 2017-06-16 ENCOUNTER — Encounter: Payer: Self-pay | Admitting: Rehabilitation

## 2017-06-16 ENCOUNTER — Ambulatory Visit: Payer: Medicare Other | Admitting: Rehabilitation

## 2017-06-16 DIAGNOSIS — G8929 Other chronic pain: Secondary | ICD-10-CM

## 2017-06-16 DIAGNOSIS — M5441 Lumbago with sciatica, right side: Principal | ICD-10-CM

## 2017-06-16 DIAGNOSIS — M6281 Muscle weakness (generalized): Secondary | ICD-10-CM

## 2017-06-16 NOTE — Therapy (Signed)
[facility] [address], [place], [place], [address] Phone: [phone]   Fax:  [phone]  Physical Therapy Treatment  Patient Details  Name: Tracy Bernard MRN: [id number] Date of Birth: 16-Feb-1931 Referring Provider: Theadore Nan, MD  Encounter Date: 06/16/2017      PT End of Session - 06/16/17 0908    Visit Number 5   Number of Visits 12   Date for PT Re-Evaluation 07/11/17   Authorization Type Medicare g-code on 10th visit; KX modifier on 15th visit   PT Start Time 0845   PT Stop Time 0926   PT Time Calculation (min) 41 min   Activity Tolerance Patient tolerated treatment well      Past Medical History:  Diagnosis Date  . Hyperlipidemia   . Hypertension     Past Surgical History:  Procedure Laterality Date  . ABDOMINAL HYSTERECTOMY    . BREAST BIOPSY Left 1977   benign  . CATARACT EXTRACTION, BILATERAL Bilateral 2014  . CHOLECYSTECTOMY, LAPAROSCOPIC  2001  . TOTAL VAGINAL HYSTERECTOMY  1981   secondary to uterine prolapse    There were no vitals filed for this visit.      Subjective Assessment - 06/16/17 0846    Subjective Feeling good because it is the morning    Currently in Pain? No/denies                         [facility] Adult PT Treatment/Exercise - 06/16/17 0001      Lumbar Exercises: Stretches   Passive Hamstring Stretch 2 reps;30 seconds   Passive Hamstring Stretch Limitations manual by PT   Piriformis Stretch 2 reps;30 seconds   Piriformis Stretch Limitations manual by PT     Lumbar Exercises: Aerobic   Stationary Bike Nustep L2 x 8 min; seat #6     Lumbar Exercises: Supine   Ab Set 10 reps   AB Set Limitations 5" hold with sustained hip abd/ER with red band around knees    Clam 10 reps;3 seconds   Clam Limitations Hooklying with red TB at knees    Bent Knee Raise 3 seconds;10 reps   Bent Knee Raise Limitations Hooklying with red TB at knees    Bridge 15  reps;3 seconds   Bridge Limitations with sustained hip abd/ER with red band at knees    Other Supine Lumbar Exercises leg lengthener stretch bil     Lumbar Exercises: Sidelying   Clam 15 reps;3 seconds   Clam Limitations B; red TB at knees      Knee/Hip Exercises: Standing   Terminal Knee Extension Limitations RTB x 15 R   Other Standing Knee Exercises side steps red band      Knee/Hip Exercises: Supine   Quad Sets Limitations 6"x6bil into towel roll after manual PA stretching into extension                PT Education - 06/16/17 0908    Education provided Yes   Education Details quad set to improve knee flexed position   Person(s) Educated Patient   Methods Explanation;Demonstration;Tactile cues;Verbal cues;Handout   Comprehension Verbalized understanding          PT Short Term Goals - 06/16/17 0911      PT SHORT TERM GOAL #1   Title pt will be ind with initial HEP   Status Achieved           PT Long Term Goals -  06/13/17 0901      PT LONG TERM GOAL #1   Title pt will decrease R sided back pain to no more than 5/10 at the worst   Baseline 8/10   Time 6   Period Weeks   Status On-going     PT LONG TERM GOAL #2   Title pt will perform painfree R lumbar lateral flexion   Time 6   Period Weeks   Status On-going     PT LONG TERM GOAL #3   Title pt will be ind with final HEP   Time 6   Period Weeks   Status On-going               Plan - 06/16/17 0908    Clinical Impression Statement Still tolerating all well.  Added some work on decreased knee extension in supine with manual OP and quad set.  Quad set added to hep today.  Continues with no morning pain and reports it may be getting "somewhat better" .  Still with tightness R hip into ER and hamstring during manual work    PT Treatment/Interventions ADLs/Self Care Home Management;Electrical Stimulation;Traction;Moist Heat;Neuromuscular re-education;Therapeutic exercise;Therapeutic  activities;Patient/family education;Manual techniques;Taping   PT Next Visit Plan progression of core strengthening , improving knee extension position   PT Home Exercise Plan added quad set on 8/9 with towel roll   Consulted and Agree with Plan of Care Patient      Patient will benefit from skilled therapeutic intervention in order to improve the following deficits and impairments:  Decreased mobility, Difficulty walking, Pain  Visit Diagnosis: Chronic right-sided low back pain with right-sided sciatica  Muscle weakness (generalized)     Problem List Patient Active Problem List   Diagnosis Date Noted  . Vitamin D deficiency 12/17/2014  . Atrophic vaginitis 12/17/2014  . HTN (hypertension) 09/10/2013  . Hypercholesterolemia 09/10/2013  . Postmenopausal HRT (hormone replacement therapy) 09/10/2013    Stark Bray, DPT, CMP 06/16/2017, 9:27 AM  [facility] [address] [place], [place], [address] Phone: [phone]   Fax:  [phone]  Name: Tracy Bernard MRN: [id number] Date of Birth: 03/26/1931

## 2017-06-20 ENCOUNTER — Ambulatory Visit: Payer: Medicare Other | Admitting: Physical Therapy

## 2017-06-20 ENCOUNTER — Encounter: Payer: Self-pay | Admitting: Physical Therapy

## 2017-06-20 DIAGNOSIS — M5441 Lumbago with sciatica, right side: Secondary | ICD-10-CM | POA: Diagnosis not present

## 2017-06-20 DIAGNOSIS — M6281 Muscle weakness (generalized): Secondary | ICD-10-CM | POA: Diagnosis not present

## 2017-06-20 DIAGNOSIS — G8929 Other chronic pain: Secondary | ICD-10-CM | POA: Diagnosis not present

## 2017-06-20 NOTE — Therapy (Signed)
[facility] [address] [place], [place], [address] Phone: [phone]   Fax:  [phone]  Physical Therapy Treatment  Patient Details  Name: Tracy Bernard MRN: [id number] Date of Birth: 1931/01/11 Referring Provider: Theadore Nan, MD  Encounter Date: 06/20/2017      PT End of Session - 06/20/17 1021    Visit Number 6   Number of Visits 12   Date for PT Re-Evaluation 07/11/17   Authorization Type Medicare g-code on 10th visit; KX modifier on 15th visit   PT Start Time 1018   PT Stop Time 1057   PT Time Calculation (min) 39 min   Activity Tolerance Patient tolerated treatment well   Behavior During Therapy [facility] for tasks assessed/performed      Past Medical History:  Diagnosis Date  . Hyperlipidemia   . Hypertension     Past Surgical History:  Procedure Laterality Date  . ABDOMINAL HYSTERECTOMY    . BREAST BIOPSY Left 1977   benign  . CATARACT EXTRACTION, BILATERAL Bilateral 2014  . CHOLECYSTECTOMY, LAPAROSCOPIC  2001  . TOTAL VAGINAL HYSTERECTOMY  1981   secondary to uterine prolapse    There were no vitals filed for this visit.      Subjective Assessment - 06/20/17 1022    Subjective I am feeling good this AM. No pain, had some pain Thursday evening after standing for a long time.    Currently in Pain? No/denies   Multiple Pain Sites No                         [facility] Adult PT Treatment/Exercise - 06/20/17 0001      Self-Care   Self-Care Posture;Other Self-Care Comments   Posture Coming out of her flexed posture, being more upright     Therapeutic Activites    Therapeutic Activities Other Therapeutic Activities   Other Therapeutic Activities Rolling in bed, clearing hips, using pillow between knees     Lumbar Exercises: Stretches   Single Knee to Chest Stretch 2 reps;20 seconds     Lumbar Exercises: Aerobic   Stationary Bike Bike L1 x 8 min  Review of status     Lumbar Exercises: Seated   Long Arc Quad on Chair --  Bil 3 sec hold 10x with lumbar support   Hip Flexion on Ball Limitations Red band shld horiz abd with posture focus  2 x10    Sit to Stand 20 reps   Sit to Stand Limitations Ball squeeze with concurrent TA contraction 3 sec hold 6x  VC for posture and to sit taller each rep     Lumbar Exercises: Supine   Clam 20 reps   Clam Limitations Hooklying with red TB at knees    Bent Knee Raise 15 reps   Bridge 15 reps;3 seconds   Bridge Limitations with sustained hip abd/ER with red band at knees    Other Supine Lumbar Exercises leg lengthener stretch bil                  PT Short Term Goals - 06/16/17 0911      PT SHORT TERM GOAL #1   Title pt will be ind with initial HEP   Status Achieved           PT Long Term Goals - 06/13/17 0901      PT LONG TERM GOAL #1   Title pt will decrease R sided back pain to  no more than 5/10 at the worst   Baseline 8/10   Time 6   Period Weeks   Status On-going     PT LONG TERM GOAL #2   Title pt will perform painfree R lumbar lateral flexion   Time 6   Period Weeks   Status On-going     PT LONG TERM GOAL #3   Title pt will be ind with final HEP   Time 6   Period Weeks   Status On-going               Plan - 06/20/17 1021    Clinical Impression Statement Pt reports at this point she is 20% improved in regards to her pain decreasing.  She is working on her posture but finds it difficult to know when she is stooping forward. We worked on increasing her awareness to this in addition to more supportive sleeping postures.    Rehab Potential Good   PT Frequency 2x / week   PT Duration 6 weeks   PT Treatment/Interventions ADLs/Self Care Home Management;Electrical Stimulation;Traction;Moist Heat;Neuromuscular re-education;Therapeutic exercise;Therapeutic activities;Patient/family education;Manual techniques;Taping   PT Next Visit Plan progression of core strengthening ,  improving knee extension position   Consulted and Agree with Plan of Care Patient      Patient will benefit from skilled therapeutic intervention in order to improve the following deficits and impairments:  Decreased mobility, Difficulty walking, Pain  Visit Diagnosis: Chronic right-sided low back pain with right-sided sciatica  Muscle weakness (generalized)     Problem List Patient Active Problem List   Diagnosis Date Noted  . Vitamin D deficiency 12/17/2014  . Atrophic vaginitis 12/17/2014  . HTN (hypertension) 09/10/2013  . Hypercholesterolemia 09/10/2013  . Postmenopausal HRT (hormone replacement therapy) 09/10/2013    Tija Biss, PTA 06/20/2017, 10:57 AM  [facility] [address], [place], [place], [address] Phone: [phone]   Fax:  [phone]  Name: Tracy Bernard MRN: [id number] Date of Birth: [birth year]

## 2017-06-21 DIAGNOSIS — Z1231 Encounter for screening mammogram for malignant neoplasm of breast: Secondary | ICD-10-CM | POA: Diagnosis not present

## 2017-06-22 ENCOUNTER — Encounter: Payer: Self-pay | Admitting: Physical Therapy

## 2017-06-22 ENCOUNTER — Ambulatory Visit: Payer: Medicare Other | Admitting: Physical Therapy

## 2017-06-22 DIAGNOSIS — M6281 Muscle weakness (generalized): Secondary | ICD-10-CM

## 2017-06-22 DIAGNOSIS — M5441 Lumbago with sciatica, right side: Principal | ICD-10-CM

## 2017-06-22 DIAGNOSIS — G8929 Other chronic pain: Secondary | ICD-10-CM | POA: Diagnosis not present

## 2017-06-22 NOTE — Therapy (Signed)
[facility] [address] [place], [place], [address] Phone: [phone]   Fax:  [phone]  Physical Therapy Treatment  Patient Details  Name: Tracy Bernard MRN: [id number] Date of Birth: [birth year] Referring Provider: Theadore Nan, MD  Encounter Date: 06/22/2017      PT End of Session - 06/22/17 1020    Visit Number 7   Number of Visits 12   Date for PT Re-Evaluation 07/11/17   Authorization Type Medicare g-code on 10th visit; KX modifier on 15th visit   PT Start Time 1015   PT Stop Time 1055   PT Time Calculation (min) 40 min   Activity Tolerance Patient tolerated treatment well      Past Medical History:  Diagnosis Date  . Hyperlipidemia   . Hypertension     Past Surgical History:  Procedure Laterality Date  . ABDOMINAL HYSTERECTOMY    . BREAST BIOPSY Left 1977   benign  . CATARACT EXTRACTION, BILATERAL Bilateral 2014  . CHOLECYSTECTOMY, LAPAROSCOPIC  2001  . TOTAL VAGINAL HYSTERECTOMY  1981   secondary to uterine prolapse    There were no vitals filed for this visit.      Subjective Assessment - 06/22/17 1021    Subjective Yesterday I did good but last night i had bad cramps in my Rt lower leg. A little soreness remains in that lower leg.    Currently in Pain? No/denies  Denies pain but my back feels "touchy"   Multiple Pain Sites No                         [facility] Adult PT Treatment/Exercise - 06/22/17 0001      Lumbar Exercises: Stretches   Single Knee to Chest Stretch 2 reps;20 seconds   Pelvic Tilt --  2x10      Lumbar Exercises: Aerobic   Stationary Bike Nustep L1-2 x 10 min     Lumbar Exercises: Standing   Heel Raises 20 reps     Lumbar Exercises: Seated   Long Arc Quad on Chair --  Bil 3 sec hold 10x with lumbar support   Sit to Stand 20 reps  2x 10     Lumbar Exercises: Supine   Ab Set 10 reps;4 seconds   Glut Set 10 reps;4 seconds  With adductor  squeeze   Clam 20 reps   Clam Limitations Hooklying with red TB at knees    Bent Knee Raise 20 reps   Bridge 20 reps;5 seconds   Bridge Limitations with sustained hip abd/ER with red band at knees      Knee/Hip Exercises: Supine   Short Arc Quad Sets Strengthening;Both;1 set;10 reps     Moist Heat Therapy   Number Minutes Moist Heat --  During supine exs                  PT Short Term Goals - 06/16/17 0911      PT SHORT TERM GOAL #1   Title pt will be ind with initial HEP   Status Achieved           PT Long Term Goals - 06/13/17 0901      PT LONG TERM GOAL #1   Title pt will decrease R sided back pain to no more than 5/10 at the worst   Baseline 8/10   Time 6   Period Weeks   Status On-going     PT LONG  TERM GOAL #2   Title pt will perform painfree R lumbar lateral flexion   Time 6   Period Weeks   Status On-going     PT LONG TERM GOAL #3   Title pt will be ind with final HEP   Time 6   Period Weeks   Status On-going               Plan - 06/22/17 1021    Clinical Impression Statement Pt's energy levels were a bit low as she did not sleep much last night due to lower legs cramps. Pt was able to make small increases in her reps/sets or hold times today.    Rehab Potential Good   PT Frequency 2x / week   PT Duration 6 weeks   PT Treatment/Interventions ADLs/Self Care Home Management;Electrical Stimulation;Traction;Moist Heat;Neuromuscular re-education;Therapeutic exercise;Therapeutic activities;Patient/family education;Manual techniques;Taping   PT Next Visit Plan progression of core strengthening , improving knee extension position   Consulted and Agree with Plan of Care Patient      Patient will benefit from skilled therapeutic intervention in order to improve the following deficits and impairments:  Decreased mobility, Difficulty walking, Pain  Visit Diagnosis: Chronic right-sided low back pain with right-sided sciatica  Muscle  weakness (generalized)     Problem List Patient Active Problem List   Diagnosis Date Noted  . Vitamin D deficiency 12/17/2014  . Atrophic vaginitis 12/17/2014  . HTN (hypertension) 09/10/2013  . Hypercholesterolemia 09/10/2013  . Postmenopausal HRT (hormone replacement therapy) 09/10/2013    Tracy Bernard, PTA 06/22/2017, 10:53 AM  [facility] [address] [place], [place], [address] Phone: [phone]   Fax:  [phone]  Name: Tracy Bernard MRN: [id number] Date of Birth: Jul 12, 1931

## 2017-06-27 ENCOUNTER — Ambulatory Visit: Payer: Medicare Other | Admitting: Rehabilitation

## 2017-06-27 ENCOUNTER — Encounter: Payer: Self-pay | Admitting: Rehabilitation

## 2017-06-27 DIAGNOSIS — M6281 Muscle weakness (generalized): Secondary | ICD-10-CM | POA: Diagnosis not present

## 2017-06-27 DIAGNOSIS — M5441 Lumbago with sciatica, right side: Secondary | ICD-10-CM | POA: Diagnosis not present

## 2017-06-27 DIAGNOSIS — G8929 Other chronic pain: Secondary | ICD-10-CM

## 2017-06-27 NOTE — Therapy (Signed)
[facility] [address] [place], [place], [address] Phone: [phone]   Fax:  [phone]  Physical Therapy Treatment  Patient Details  Name: Tracy Bernard MRN: [id number] Date of Birth: 1931/01/06 Referring Provider: Theadore Nan, MD  Encounter Date: 06/27/2017      PT End of Session - 06/27/17 1141    Visit Number 9   Number of Visits 12   Date for PT Re-Evaluation 07/11/17   Authorization Type Medicare g-code on 10th visit; KX modifier on 15th visit   PT Start Time 1100   PT Stop Time 1140   PT Time Calculation (min) 40 min   Equipment Utilized During Treatment Gait belt   Activity Tolerance Patient tolerated treatment well      Past Medical History:  Diagnosis Date  . Hyperlipidemia   . Hypertension     Past Surgical History:  Procedure Laterality Date  . ABDOMINAL HYSTERECTOMY    . BREAST BIOPSY Left 1977   benign  . CATARACT EXTRACTION, BILATERAL Bilateral 2014  . CHOLECYSTECTOMY, LAPAROSCOPIC  2001  . TOTAL VAGINAL HYSTERECTOMY  1981   secondary to uterine prolapse    There were no vitals filed for this visit.      Subjective Assessment - 06/27/17 1058    Subjective "It may be a little better"  off and on   Currently in Pain? No/denies                         [facility] Adult PT Treatment/Exercise - 06/27/17 0001      Lumbar Exercises: Aerobic   Stationary Bike Nustep level 2x21min     Lumbar Exercises: Standing   Heel Raises 20 reps     Lumbar Exercises: Seated   Long Arc Quad on Chair Both;1 set;10 reps;Weights   LAQ on Chair Weights (lbs) 2   Sit to Stand 20 reps     Lumbar Exercises: Supine   Bent Knee Raise 20 reps   Bridge 20 reps;5 seconds     Lumbar Exercises: Sidelying   Clam 15 reps;3 seconds   Clam Limitations B; red TB at knees      Lumbar Exercises: Prone   Straight Leg Raise 10 reps  bil     Knee/Hip Exercises: Stretches   Passive Hamstring  Stretch Both;30 seconds   Passive Hamstring Stretch Limitations by PT                PT Education - 06/27/17 1141    Education provided Yes   Education Details yoga classes for discharge   Person(s) Educated Patient   Methods Explanation   Comprehension Verbalized understanding          PT Short Term Goals - 06/16/17 0911      PT SHORT TERM GOAL #1   Title pt will be ind with initial HEP   Status Achieved           PT Long Term Goals - 06/13/17 0901      PT LONG TERM GOAL #1   Title pt will decrease R sided back pain to no more than 5/10 at the worst   Baseline 8/10   Time 6   Period Weeks   Status On-going     PT LONG TERM GOAL #2   Title pt will perform painfree R lumbar lateral flexion   Time 6   Period Weeks   Status On-going     PT  LONG TERM GOAL #3   Title pt will be ind with final HEP   Time 6   Period Weeks   Status On-going               Plan - 06/27/17 1142    Clinical Impression Statement Tolerated all well today.  Feeling like after 3 more sessions per initial plan she will be ready for DC with HEP and will try yoga classes.  Able to tolerated prone hip extension today but evidently stiff in the low back.     PT Frequency 2x / week   PT Duration 6 weeks   PT Treatment/Interventions ADLs/Self Care Home Management;Electrical Stimulation;Traction;Moist Heat;Neuromuscular re-education;Therapeutic exercise;Therapeutic activities;Patient/family education;Manual techniques;Taping   PT Next Visit Plan progression of core strengthening , improving knee extension position      Patient will benefit from skilled therapeutic intervention in order to improve the following deficits and impairments:  Decreased mobility, Difficulty walking, Pain  Visit Diagnosis: Chronic right-sided low back pain with right-sided sciatica  Muscle weakness (generalized)     Problem List Patient Active Problem List   Diagnosis Date Noted  . Vitamin D  deficiency 12/17/2014  . Atrophic vaginitis 12/17/2014  . HTN (hypertension) 09/10/2013  . Hypercholesterolemia 09/10/2013  . Postmenopausal HRT (hormone replacement therapy) 09/10/2013    Stark Bray, DPT, CMP 06/27/2017, 11:43 AM  [facility] [address], [place], [place], [address] Phone: [phone]   Fax:  [phone]  Name: Tracy Bernard MRN: [id number] Date of Birth: November 26, 1930

## 2017-06-29 ENCOUNTER — Ambulatory Visit: Payer: Medicare Other

## 2017-06-29 DIAGNOSIS — G8929 Other chronic pain: Secondary | ICD-10-CM | POA: Diagnosis not present

## 2017-06-29 DIAGNOSIS — M6281 Muscle weakness (generalized): Secondary | ICD-10-CM

## 2017-06-29 DIAGNOSIS — M5441 Lumbago with sciatica, right side: Principal | ICD-10-CM

## 2017-06-29 NOTE — Therapy (Signed)
[facility] [address] [place], [place], [address] Phone: [phone]   Fax:  [phone]  Physical Therapy Treatment  Patient Details  Name: Tracy Bernard MRN: [id number] Date of Birth: Mar 14, 1931 Referring Provider: Theadore Nan, MD  Encounter Date: 06/29/2017      PT End of Session - 06/29/17 1307    Visit Number 10   Number of Visits 10   Date for PT Re-Evaluation 07/11/17   Authorization Type Medicare g-code on 20th visit; KX modifier on 15th visit   PT Start Time 1232   PT Stop Time 1312   PT Time Calculation (min) 40 min   Activity Tolerance Patient tolerated treatment well   Behavior During Therapy [facility] for tasks assessed/performed      Past Medical History:  Diagnosis Date  . Hyperlipidemia   . Hypertension     Past Surgical History:  Procedure Laterality Date  . ABDOMINAL HYSTERECTOMY    . BREAST BIOPSY Left 1977   benign  . CATARACT EXTRACTION, BILATERAL Bilateral 2014  . CHOLECYSTECTOMY, LAPAROSCOPIC  2001  . TOTAL VAGINAL HYSTERECTOMY  1981   secondary to uterine prolapse    There were no vitals filed for this visit.      Subjective Assessment - 06/29/17 1236    Subjective I am feeling better.     Currently in Pain? No/denies            [facility] PT Assessment - 06/29/17 0001      Observation/Other Assessments   Focus on Therapeutic Outcomes (FOTO)  39%                     [facility] Adult PT Treatment/Exercise - 06/29/17 0001      Lumbar Exercises: Stretches   Lower Trunk Rotation 3 reps;20 seconds     Lumbar Exercises: Aerobic   Stationary Bike Nustep level 2x78min     Lumbar Exercises: Standing   Heel Raises 20 reps     Lumbar Exercises: Seated   Long Arc Quad on Chair Both;1 set;10 reps;Weights   LAQ on Chair Weights (lbs) 2   Sit to Stand 20 reps  with abdominal bracing     Knee/Hip Exercises: Standing   Hip Flexion Stengthening;Both;2 sets;10 reps   Hip Abduction Stengthening;Both;2 sets;10 reps  abdominal bracing   Hip Extension Stengthening;Both;2 sets;10 reps  abdominal bracing     Knee/Hip Exercises: Supine   Quad Sets Strengthening;Both;1 set;10 reps                PT Education - 06/29/17 1251    Education provided (P)  Yes   Education Details (P)  standing hip exercises with abdominal bracing   Person(s) Educated (P)  Patient   Methods (P)  Explanation;Demonstration;Handout   Comprehension (P)  Verbalized understanding;Returned demonstration          PT Short Term Goals - 06/16/17 0911      PT SHORT TERM GOAL #1   Title pt will be ind with initial HEP   Status Achieved           PT Long Term Goals - 06/29/17 1237      PT LONG TERM GOAL #1   Title pt will decrease R sided back pain to no more than 5/10 at the worst   Status Achieved               Plan - 06/29/17 1238    Clinical Impression Statement Pt  reports 20-25% overall improvement in symptoms since the start of care.  Pt is indepdent in HEP for flexibility and strength.  Pt without pain over the past 2 PT sessions.  Pain increases with standing and walking long periods.  Pt will benefit from skilled PT for 2 more sessions to advance HEP.     Rehab Potential Good   PT Frequency 2x / week   PT Duration 6 weeks   PT Treatment/Interventions ADLs/Self Care Home Management;Electrical Stimulation;Traction;Moist Heat;Neuromuscular re-education;Therapeutic exercise;Therapeutic activities;Patient/family education;Manual techniques;Taping   PT Next Visit Plan progression of core strengthening , improving knee extension position   Consulted and Agree with Plan of Care Patient      Patient will benefit from skilled therapeutic intervention in order to improve the following deficits and impairments:  Decreased mobility, Difficulty walking, Pain  Visit Diagnosis: Chronic right-sided low back pain with right-sided sciatica  Muscle weakness  (generalized)       G-Codes - 15-Jul-2017 1312    Functional Assessment Tool Used (Outpatient Only) 39% limitation   Functional Limitation Mobility: Walking and moving around   Mobility: Walking and Moving Around Current Status ([id number]) At least 20 percent but less than 40 percent impaired, limited or restricted   Mobility: Walking and Moving Around Goal Status [id number]) At least 20 percent but less than 40 percent impaired, limited or restricted      Problem List Patient Active Problem List   Diagnosis Date Noted  . Vitamin D deficiency 12/17/2014  . Atrophic vaginitis 12/17/2014  . HTN (hypertension) 09/10/2013  . Hypercholesterolemia 09/10/2013  . Postmenopausal HRT (hormone replacement therapy) 09/10/2013    Tracy Bernard, PT 07/15/17 1:20 PM  [facility] [address] [place], [place], [address] Phone: [phone]   Fax:  [phone]  Name: Tracy Bernard MRN: [id number] Date of Birth: 07-07-1931

## 2017-06-29 NOTE — Patient Instructions (Addendum)
Knee High   Holding stable object, raise knee to hip level, then lower knee. Repeat with other knee. Complete __10_ repetitions. Do __2__ sessions per day.  ABDUCTION: Standing (Active)   Stand, feet flat. Lift right leg out to side. Use _0__ lbs. Complete __10_ repetitions. Perform __2_ sessions per day.    EXTENSION: Standing (Active)  Stand, both feet flat. Draw right leg behind body as far as possible. Use 0___ lbs. Complete 10 repetitions. Perform __2_ sessions per day.  Copyright  VHI. All rights reserved.   [facility] [address], [address] [place], [place] [address] Phone # [phone] Fax [phone] 

## 2017-07-04 ENCOUNTER — Ambulatory Visit: Payer: Medicare Other

## 2017-07-04 DIAGNOSIS — M5441 Lumbago with sciatica, right side: Secondary | ICD-10-CM | POA: Diagnosis not present

## 2017-07-04 DIAGNOSIS — M6281 Muscle weakness (generalized): Secondary | ICD-10-CM

## 2017-07-04 DIAGNOSIS — G8929 Other chronic pain: Secondary | ICD-10-CM | POA: Diagnosis not present

## 2017-07-04 NOTE — Therapy (Signed)
[facility] [address] [place], [place], [address] Phone: [phone]   Fax:  [phone]  Physical Therapy Treatment  Patient Details  Name: Tracy Bernard MRN: [id number] Date of Birth: 1931/01/16 Referring Provider: Theadore Nan, MD  Encounter Date: 07/04/2017      PT End of Session - 07/04/17 1215    Visit Number 11   Date for PT Re-Evaluation 07/11/17   Authorization Type Medicare g-code on 20th visit; KX modifier on 15th visit   PT Start Time 1146   PT Stop Time 1225   PT Time Calculation (min) 39 min   Activity Tolerance Patient tolerated treatment well   Behavior During Therapy [facility] for tasks assessed/performed      Past Medical History:  Diagnosis Date  . Hyperlipidemia   . Hypertension     Past Surgical History:  Procedure Laterality Date  . ABDOMINAL HYSTERECTOMY    . BREAST BIOPSY Left 1977   benign  . CATARACT EXTRACTION, BILATERAL Bilateral 2014  . CHOLECYSTECTOMY, LAPAROSCOPIC  2001  . TOTAL VAGINAL HYSTERECTOMY  1981   secondary to uterine prolapse    There were no vitals filed for this visit.      Subjective Assessment - 07/04/17 1145    Subjective My blood pressure was low this morning.  Feeling better now.     Currently in Pain? No/denies                         [facility] Adult PT Treatment/Exercise - 07/04/17 0001      Lumbar Exercises: Stretches   Lower Trunk Rotation 3 reps;20 seconds     Lumbar Exercises: Aerobic   Stationary Bike Nustep level 2x28min     Lumbar Exercises: Standing   Heel Raises 20 reps     Lumbar Exercises: Seated   Long Arc Quad on Chair Both;1 set;10 reps;Weights   LAQ on Chair Weights (lbs) 2   Sit to Stand 20 reps  with abdominal bracing     Knee/Hip Exercises: Standing   Hip Flexion Stengthening;Both;2 sets;10 reps   Hip Abduction Stengthening;Both;2 sets;10 reps  abdominal bracing   Hip Extension Stengthening;Both;2  sets;10 reps  abdominal bracing     Knee/Hip Exercises: Supine   Quad Sets Strengthening;Both;1 set;10 reps                  PT Short Term Goals - 06/16/17 0911      PT SHORT TERM GOAL #1   Title pt will be ind with initial HEP   Status Achieved           PT Long Term Goals - 06/29/17 1237      PT LONG TERM GOAL #1   Title pt will decrease R sided back pain to no more than 5/10 at the worst   Status Achieved               Plan - 07/04/17 1158    Clinical Impression Statement Pt reports 20-25% overall improvement in symptoms since the start of care.  Pt with minimal to no pain over the past 2 weeks.  Pt will benefit from one more session to finalize HEP for strength and flexibility.     Rehab Potential Good   PT Frequency 2x / week   PT Duration 6 weeks   PT Treatment/Interventions ADLs/Self Care Home Management;Electrical Stimulation;Traction;Moist Heat;Neuromuscular re-education;Therapeutic exercise;Therapeutic activities;Patient/family education;Manual techniques;Taping   PT Next Visit Plan 1  more session to finalize HEP.  G-codes.     Consulted and Agree with Plan of Care Patient      Patient will benefit from skilled therapeutic intervention in order to improve the following deficits and impairments:  Decreased mobility, Difficulty walking, Pain  Visit Diagnosis: Chronic right-sided low back pain with right-sided sciatica  Muscle weakness (generalized)     Problem List Patient Active Problem List   Diagnosis Date Noted  . Vitamin D deficiency 12/17/2014  . Atrophic vaginitis 12/17/2014  . HTN (hypertension) 09/10/2013  . Hypercholesterolemia 09/10/2013  . Postmenopausal HRT (hormone replacement therapy) 09/10/2013    Sigurd Sos, PT 07/04/17 12:18 PM  [facility] [address], [place], [place], [address] Phone: [phone]   Fax:  [phone]  Name: GILLIAN MEEUWSEN MRN: [id number] Date of Birth: 20-Jan-1931

## 2017-07-06 ENCOUNTER — Ambulatory Visit: Payer: Medicare Other

## 2017-07-06 DIAGNOSIS — G8929 Other chronic pain: Secondary | ICD-10-CM | POA: Diagnosis not present

## 2017-07-06 DIAGNOSIS — M6281 Muscle weakness (generalized): Secondary | ICD-10-CM

## 2017-07-06 DIAGNOSIS — M5441 Lumbago with sciatica, right side: Principal | ICD-10-CM

## 2017-07-06 NOTE — Therapy (Signed)
[facility] [address], [place], [place], [address] Phone: [phone]   Fax:  [phone]  Physical Therapy Treatment  Patient Details  Name: Tracy Bernard MRN: [id number] Date of Birth: [birth year] Referring Provider: Theadore Nan, MD  Encounter Date: 07/06/2017      PT End of Session - 07/06/17 1049    Visit Number 12   PT Start Time 4680   PT Stop Time 1049   PT Time Calculation (min) 34 min   Activity Tolerance Patient tolerated treatment well   Behavior During Therapy [facility] for tasks assessed/performed      Past Medical History:  Diagnosis Date  . Hyperlipidemia   . Hypertension     Past Surgical History:  Procedure Laterality Date  . ABDOMINAL HYSTERECTOMY    . BREAST BIOPSY Left 1977   benign  . CATARACT EXTRACTION, BILATERAL Bilateral 2014  . CHOLECYSTECTOMY, LAPAROSCOPIC  2001  . TOTAL VAGINAL HYSTERECTOMY  1981   secondary to uterine prolapse    There were no vitals filed for this visit.      Subjective Assessment - 07/06/17 1023    Subjective Ready for D/C to HEP.   Currently in Pain? No/denies   Pain Score --  yestderday pain was 5/10 in low back   Pain Location Back   Pain Orientation Right   Pain Type Chronic pain   Pain Onset More than a month ago   Pain Frequency Intermittent   Aggravating Factors  walking, a lot of activity/standing a lot   Pain Relieving Factors flexion, rest            [facility] PT Assessment - 07/06/17 0001      Assessment   Medical Diagnosis Lumbar Pain     Observation/Other Assessments   Focus on Therapeutic Outcomes (FOTO)  39%     AROM   Lumbar - Right Side Bend full with no pain- soreness in the muscle                     [facility] Adult PT Treatment/Exercise - 07/06/17 0001      Lumbar Exercises: Stretches   Lower Trunk Rotation 3 reps;20 seconds     Lumbar Exercises: Aerobic   Stationary Bike Level 2 x 10 minutes     Lumbar  Exercises: Standing   Heel Raises 20 reps     Lumbar Exercises: Seated   Long Arc Quad on Chair Both;1 set;10 reps;Weights   LAQ on Chair Weights (lbs) 2   Sit to Stand 20 reps  with abdominal bracing     Lumbar Exercises: Supine   Bridge 20 reps;5 seconds     Knee/Hip Exercises: Standing   Hip Flexion Stengthening;Both;2 sets;10 reps   Hip Abduction Stengthening;Both;2 sets;10 reps  abdominal bracing   Hip Extension Stengthening;Both;2 sets;10 reps  abdominal bracing     Knee/Hip Exercises: Supine   Quad Sets Strengthening;Both;1 set;10 reps                  PT Short Term Goals - 06/16/17 0911      PT SHORT TERM GOAL #1   Title pt will be ind with initial HEP   Status Achieved           PT Long Term Goals - 07/06/17 1021      PT LONG TERM GOAL #1   Title pt will decrease R sided back pain to no more than 5/10 at the worst  Status Achieved     PT LONG TERM GOAL #2   Title pt will perform painfree R lumbar lateral flexion   Status Achieved     PT LONG TERM GOAL #3   Title pt will be ind with final HEP   Status Achieved               Plan - July 10, 2017 1024    Clinical Impression Statement Pt with flare-up of pain yesterday with 5/10 LBP after standing a lot.  Pt reports 20% overall improvement since the start of care.  Pt has HEP in place for strength and flexibility and plans to continue with this exercise for continued gains .    PT Next Visit Plan D/C PT to HEP   Consulted and Agree with Plan of Care Patient      Patient will benefit from skilled therapeutic intervention in order to improve the following deficits and impairments:     Visit Diagnosis: Chronic right-sided low back pain with right-sided sciatica  Muscle weakness (generalized)       G-Codes - 2017/07/10 1039    Functional Assessment Tool Used (Outpatient Only) 39% limitation   Functional Limitation Mobility: Walking and moving around   Mobility: Walking and Moving  Around Goal Status [phone]) At least 20 percent but less than 40 percent impaired, limited or restricted   Mobility: Walking and Moving Around Discharge Status [phone]) At least 20 percent but less than 40 percent impaired, limited or restricted      Problem List Patient Active Problem List   Diagnosis Date Noted  . Vitamin D deficiency 12/17/2014  . Atrophic vaginitis 12/17/2014  . HTN (hypertension) 09/10/2013  . Hypercholesterolemia 09/10/2013  . Postmenopausal HRT (hormone replacement therapy) 09/10/2013  PHYSICAL THERAPY DISCHARGE SUMMARY  Visits from Start of Care: 12  Current functional level related to goals / functional outcomes: Pt with intermittent LBP and limited endurance for standing and walking long periods due to pain.     Remaining deficits: See above.     Education / Equipment: Pt has HEP in place, Banker education Plan: Patient agrees to discharge.  Patient goals were met. Patient is being discharged due to meeting the stated rehab goals.  ?????       Sigurd Sos, PT July 10, 2017 10:51 AM  [facility] [address], [place], [place], [address] Phone: [phone]   Fax:  [phone]  Name: Tracy Bernard MRN: [id number] Date of Birth: [birth year]

## 2017-08-01 DIAGNOSIS — Z23 Encounter for immunization: Secondary | ICD-10-CM | POA: Diagnosis not present

## 2017-08-12 DIAGNOSIS — L821 Other seborrheic keratosis: Secondary | ICD-10-CM | POA: Diagnosis not present

## 2017-08-12 DIAGNOSIS — Z85828 Personal history of other malignant neoplasm of skin: Secondary | ICD-10-CM | POA: Diagnosis not present

## 2017-08-12 DIAGNOSIS — L905 Scar conditions and fibrosis of skin: Secondary | ICD-10-CM | POA: Diagnosis not present

## 2017-08-12 DIAGNOSIS — D1801 Hemangioma of skin and subcutaneous tissue: Secondary | ICD-10-CM | POA: Diagnosis not present

## 2017-08-12 DIAGNOSIS — L814 Other melanin hyperpigmentation: Secondary | ICD-10-CM | POA: Diagnosis not present

## 2017-08-30 DIAGNOSIS — Z23 Encounter for immunization: Secondary | ICD-10-CM | POA: Diagnosis not present

## 2017-08-30 DIAGNOSIS — I10 Essential (primary) hypertension: Secondary | ICD-10-CM | POA: Diagnosis not present

## 2017-08-30 DIAGNOSIS — M533 Sacrococcygeal disorders, not elsewhere classified: Secondary | ICD-10-CM | POA: Diagnosis not present

## 2017-08-30 DIAGNOSIS — G8929 Other chronic pain: Secondary | ICD-10-CM | POA: Diagnosis not present

## 2017-08-30 DIAGNOSIS — Z79899 Other long term (current) drug therapy: Secondary | ICD-10-CM | POA: Diagnosis not present

## 2017-08-30 DIAGNOSIS — H409 Unspecified glaucoma: Secondary | ICD-10-CM | POA: Diagnosis not present

## 2017-08-30 DIAGNOSIS — J309 Allergic rhinitis, unspecified: Secondary | ICD-10-CM | POA: Diagnosis not present

## 2017-08-30 DIAGNOSIS — Z7189 Other specified counseling: Secondary | ICD-10-CM | POA: Diagnosis not present

## 2017-08-30 DIAGNOSIS — K219 Gastro-esophageal reflux disease without esophagitis: Secondary | ICD-10-CM | POA: Diagnosis not present

## 2017-08-30 DIAGNOSIS — E782 Mixed hyperlipidemia: Secondary | ICD-10-CM | POA: Diagnosis not present

## 2017-08-30 DIAGNOSIS — Z0000 Encounter for general adult medical examination without abnormal findings: Secondary | ICD-10-CM | POA: Diagnosis not present

## 2017-08-30 DIAGNOSIS — Z1389 Encounter for screening for other disorder: Secondary | ICD-10-CM | POA: Diagnosis not present

## 2017-09-08 DIAGNOSIS — Z961 Presence of intraocular lens: Secondary | ICD-10-CM | POA: Diagnosis not present

## 2017-09-08 DIAGNOSIS — H401131 Primary open-angle glaucoma, bilateral, mild stage: Secondary | ICD-10-CM | POA: Diagnosis not present

## 2017-11-15 DIAGNOSIS — M5416 Radiculopathy, lumbar region: Secondary | ICD-10-CM | POA: Diagnosis not present

## 2017-11-15 DIAGNOSIS — M5136 Other intervertebral disc degeneration, lumbar region: Secondary | ICD-10-CM | POA: Diagnosis not present

## 2017-11-15 DIAGNOSIS — M4316 Spondylolisthesis, lumbar region: Secondary | ICD-10-CM | POA: Diagnosis not present

## 2017-11-29 DIAGNOSIS — M5136 Other intervertebral disc degeneration, lumbar region: Secondary | ICD-10-CM | POA: Diagnosis not present

## 2017-12-12 DIAGNOSIS — H401131 Primary open-angle glaucoma, bilateral, mild stage: Secondary | ICD-10-CM | POA: Diagnosis not present

## 2017-12-19 DIAGNOSIS — M5416 Radiculopathy, lumbar region: Secondary | ICD-10-CM | POA: Diagnosis not present

## 2017-12-27 DIAGNOSIS — M5416 Radiculopathy, lumbar region: Secondary | ICD-10-CM | POA: Diagnosis not present

## 2018-02-06 DIAGNOSIS — M5136 Other intervertebral disc degeneration, lumbar region: Secondary | ICD-10-CM | POA: Diagnosis not present

## 2018-02-06 DIAGNOSIS — M4316 Spondylolisthesis, lumbar region: Secondary | ICD-10-CM | POA: Diagnosis not present

## 2018-02-06 DIAGNOSIS — M5416 Radiculopathy, lumbar region: Secondary | ICD-10-CM | POA: Diagnosis not present

## 2018-02-27 ENCOUNTER — Ambulatory Visit: Payer: Medicare Other | Admitting: Nurse Practitioner

## 2018-03-09 DIAGNOSIS — M5416 Radiculopathy, lumbar region: Secondary | ICD-10-CM | POA: Diagnosis not present

## 2018-03-21 DIAGNOSIS — H401131 Primary open-angle glaucoma, bilateral, mild stage: Secondary | ICD-10-CM | POA: Diagnosis not present

## 2018-04-29 DIAGNOSIS — M5416 Radiculopathy, lumbar region: Secondary | ICD-10-CM | POA: Diagnosis not present

## 2018-04-29 DIAGNOSIS — M4316 Spondylolisthesis, lumbar region: Secondary | ICD-10-CM | POA: Diagnosis not present

## 2018-06-08 DIAGNOSIS — M5136 Other intervertebral disc degeneration, lumbar region: Secondary | ICD-10-CM | POA: Diagnosis not present

## 2018-06-08 DIAGNOSIS — M5416 Radiculopathy, lumbar region: Secondary | ICD-10-CM | POA: Diagnosis not present

## 2018-06-26 DIAGNOSIS — H9191 Unspecified hearing loss, right ear: Secondary | ICD-10-CM | POA: Diagnosis not present

## 2018-06-26 DIAGNOSIS — H401131 Primary open-angle glaucoma, bilateral, mild stage: Secondary | ICD-10-CM | POA: Diagnosis not present

## 2018-06-26 DIAGNOSIS — H6121 Impacted cerumen, right ear: Secondary | ICD-10-CM | POA: Diagnosis not present

## 2018-06-28 DIAGNOSIS — Z803 Family history of malignant neoplasm of breast: Secondary | ICD-10-CM | POA: Diagnosis not present

## 2018-06-28 DIAGNOSIS — Z1231 Encounter for screening mammogram for malignant neoplasm of breast: Secondary | ICD-10-CM | POA: Diagnosis not present

## 2018-06-29 DIAGNOSIS — M5416 Radiculopathy, lumbar region: Secondary | ICD-10-CM | POA: Diagnosis not present

## 2018-06-29 DIAGNOSIS — M4316 Spondylolisthesis, lumbar region: Secondary | ICD-10-CM | POA: Diagnosis not present

## 2018-06-29 DIAGNOSIS — M5136 Other intervertebral disc degeneration, lumbar region: Secondary | ICD-10-CM | POA: Diagnosis not present

## 2018-07-12 DIAGNOSIS — M545 Low back pain: Secondary | ICD-10-CM | POA: Diagnosis not present

## 2018-07-12 DIAGNOSIS — M5416 Radiculopathy, lumbar region: Secondary | ICD-10-CM | POA: Diagnosis not present

## 2018-07-18 ENCOUNTER — Other Ambulatory Visit: Payer: Self-pay | Admitting: Physical Medicine and Rehabilitation

## 2018-07-18 DIAGNOSIS — G8929 Other chronic pain: Secondary | ICD-10-CM

## 2018-07-18 DIAGNOSIS — M545 Low back pain: Principal | ICD-10-CM

## 2018-07-19 DIAGNOSIS — K219 Gastro-esophageal reflux disease without esophagitis: Secondary | ICD-10-CM | POA: Diagnosis not present

## 2018-07-19 DIAGNOSIS — E782 Mixed hyperlipidemia: Secondary | ICD-10-CM | POA: Diagnosis not present

## 2018-07-19 DIAGNOSIS — I10 Essential (primary) hypertension: Secondary | ICD-10-CM | POA: Diagnosis not present

## 2018-07-19 DIAGNOSIS — Z1389 Encounter for screening for other disorder: Secondary | ICD-10-CM | POA: Diagnosis not present

## 2018-07-19 DIAGNOSIS — Z23 Encounter for immunization: Secondary | ICD-10-CM | POA: Diagnosis not present

## 2018-07-19 DIAGNOSIS — G8929 Other chronic pain: Secondary | ICD-10-CM | POA: Diagnosis not present

## 2018-07-19 DIAGNOSIS — Z0000 Encounter for general adult medical examination without abnormal findings: Secondary | ICD-10-CM | POA: Diagnosis not present

## 2018-07-19 DIAGNOSIS — M533 Sacrococcygeal disorders, not elsewhere classified: Secondary | ICD-10-CM | POA: Diagnosis not present

## 2018-07-19 DIAGNOSIS — Z7189 Other specified counseling: Secondary | ICD-10-CM | POA: Diagnosis not present

## 2018-07-19 DIAGNOSIS — Z79899 Other long term (current) drug therapy: Secondary | ICD-10-CM | POA: Diagnosis not present

## 2018-07-19 DIAGNOSIS — H409 Unspecified glaucoma: Secondary | ICD-10-CM | POA: Diagnosis not present

## 2018-07-20 DIAGNOSIS — M5136 Other intervertebral disc degeneration, lumbar region: Secondary | ICD-10-CM | POA: Diagnosis not present

## 2018-07-20 DIAGNOSIS — M5416 Radiculopathy, lumbar region: Secondary | ICD-10-CM | POA: Diagnosis not present

## 2018-07-20 DIAGNOSIS — M4316 Spondylolisthesis, lumbar region: Secondary | ICD-10-CM | POA: Diagnosis not present

## 2018-07-25 DIAGNOSIS — N183 Chronic kidney disease, stage 3 (moderate): Secondary | ICD-10-CM | POA: Diagnosis not present

## 2018-07-25 DIAGNOSIS — J309 Allergic rhinitis, unspecified: Secondary | ICD-10-CM | POA: Diagnosis not present

## 2018-07-25 DIAGNOSIS — M48061 Spinal stenosis, lumbar region without neurogenic claudication: Secondary | ICD-10-CM | POA: Diagnosis not present

## 2018-07-25 DIAGNOSIS — E782 Mixed hyperlipidemia: Secondary | ICD-10-CM | POA: Diagnosis not present

## 2018-07-25 DIAGNOSIS — I10 Essential (primary) hypertension: Secondary | ICD-10-CM | POA: Diagnosis not present

## 2018-07-25 DIAGNOSIS — K219 Gastro-esophageal reflux disease without esophagitis: Secondary | ICD-10-CM | POA: Diagnosis not present

## 2018-07-25 DIAGNOSIS — G250 Essential tremor: Secondary | ICD-10-CM | POA: Diagnosis not present

## 2018-07-25 DIAGNOSIS — M898X8 Other specified disorders of bone, other site: Secondary | ICD-10-CM | POA: Diagnosis not present

## 2018-07-25 DIAGNOSIS — Z79899 Other long term (current) drug therapy: Secondary | ICD-10-CM | POA: Diagnosis not present

## 2018-07-25 DIAGNOSIS — Z23 Encounter for immunization: Secondary | ICD-10-CM | POA: Diagnosis not present

## 2018-07-25 DIAGNOSIS — H409 Unspecified glaucoma: Secondary | ICD-10-CM | POA: Diagnosis not present

## 2018-07-25 DIAGNOSIS — Z0000 Encounter for general adult medical examination without abnormal findings: Secondary | ICD-10-CM | POA: Diagnosis not present

## 2018-07-26 ENCOUNTER — Encounter (HOSPITAL_COMMUNITY)
Admission: RE | Admit: 2018-07-26 | Discharge: 2018-07-26 | Disposition: A | Discharge status: Home / Self Care | Payer: Medicare Other | Source: Ambulatory Visit | Attending: Physical Medicine and Rehabilitation | Admitting: Physical Medicine and Rehabilitation

## 2018-07-26 DIAGNOSIS — M545 Low back pain: Secondary | ICD-10-CM | POA: Insufficient documentation

## 2018-07-26 DIAGNOSIS — G8929 Other chronic pain: Secondary | ICD-10-CM | POA: Diagnosis not present

## 2018-07-26 DIAGNOSIS — S79912A Unspecified injury of left hip, initial encounter: Secondary | ICD-10-CM | POA: Diagnosis not present

## 2018-07-26 MED ORDER — TECHNETIUM TC 99M MEDRONATE IV KIT
21.5000 | PACK | Freq: Once | INTRAVENOUS | Status: AC
  Administered 2018-07-26: 21.5 via INTRAVENOUS

## 2018-07-31 DIAGNOSIS — M5416 Radiculopathy, lumbar region: Secondary | ICD-10-CM | POA: Diagnosis not present

## 2018-07-31 DIAGNOSIS — M4317 Spondylolisthesis, lumbosacral region: Secondary | ICD-10-CM | POA: Diagnosis not present

## 2018-07-31 DIAGNOSIS — M4316 Spondylolisthesis, lumbar region: Secondary | ICD-10-CM | POA: Diagnosis not present

## 2018-08-11 DIAGNOSIS — R7303 Prediabetes: Secondary | ICD-10-CM | POA: Diagnosis not present

## 2018-08-11 DIAGNOSIS — R251 Tremor, unspecified: Secondary | ICD-10-CM | POA: Diagnosis not present

## 2018-08-11 DIAGNOSIS — I10 Essential (primary) hypertension: Secondary | ICD-10-CM | POA: Diagnosis not present

## 2018-08-17 DIAGNOSIS — M5416 Radiculopathy, lumbar region: Secondary | ICD-10-CM | POA: Diagnosis not present

## 2018-08-17 DIAGNOSIS — M48061 Spinal stenosis, lumbar region without neurogenic claudication: Secondary | ICD-10-CM | POA: Diagnosis not present

## 2018-08-21 ENCOUNTER — Encounter: Payer: Self-pay | Admitting: Diagnostic Neuroimaging

## 2018-08-21 ENCOUNTER — Ambulatory Visit (INDEPENDENT_AMBULATORY_CARE_PROVIDER_SITE_OTHER): Payer: Medicare Other | Admitting: Diagnostic Neuroimaging

## 2018-08-21 ENCOUNTER — Telehealth: Payer: Self-pay | Admitting: Diagnostic Neuroimaging

## 2018-08-21 VITALS — BP 144/74 | HR 71 | Ht 62.0 in | Wt 120.2 lb

## 2018-08-21 DIAGNOSIS — M48062 Spinal stenosis, lumbar region with neurogenic claudication: Secondary | ICD-10-CM

## 2018-08-21 DIAGNOSIS — G20 Parkinson's disease: Secondary | ICD-10-CM | POA: Diagnosis not present

## 2018-08-21 DIAGNOSIS — G252 Other specified forms of tremor: Secondary | ICD-10-CM

## 2018-08-21 DIAGNOSIS — R259 Unspecified abnormal involuntary movements: Secondary | ICD-10-CM | POA: Diagnosis not present

## 2018-08-21 MED ORDER — CARBIDOPA-LEVODOPA 25-100 MG PO TABS: 1.0000 | ORAL_TABLET | Freq: Three times a day (TID) | ORAL | 12 refills | Status: DC

## 2018-08-21 NOTE — Telephone Encounter (Signed)
Medicare/aarp order sent to GI. No auth they will reach out to the pt to schedule.  °

## 2018-08-21 NOTE — Patient Instructions (Signed)
-   check MRI brain  - start carbidopa / levodopa 25/100 half tab three times a day x 2 weeks; then 1 tab three times a day; take 30 minutes before meals  - refer to physical therapy for balance

## 2018-08-21 NOTE — Progress Notes (Signed)
[facility]  PATIENT: Tracy Bernard DOB: Oct 09, 1931  REFERRING CLINICIAN: Amedeo Gory HISTORY FROM: patient  REASON FOR VISIT: new consult    HISTORICAL  CHIEF COMPLAINT:  Chief Complaint  Patient presents with  . Tremors    rm 7, New Pt, "possible Parkinson's disease"    HISTORY OF PRESENT ILLNESS:   84 year old female here for evaluation of balance and tremor problems.  Patient has had progressive resting and postural tremor in the right upper and right lower extremities since 2016.  She has also had balance and gait difficulty in last 6 months, and has had several falls.  Patient lives alone.  Symptoms have been noticed by patient as well as her daughter.  Daughter has also noticed that patient's voice has become "soft".  Patient denies any swallowing problems or sialorrhea.  Patient has had lack of sense of smell for past 20 years.  She has mild constipation.  She has intermittent wild dreams.  She is just had problems with sleepwalking.  She has some insomnia issues.  Patient was diagnosed with possible essential tremor initially by PCP, but due to progression and change of symptoms consideration of Parkinson's disease was made.  Therefore patient was referred here for further evaluation.  No family history of tremor or Parkinson's disease.   REVIEW OF SYSTEMS: Full 14 system review of systems performed and negative with exception of: Insomnia restless legs tremor allergies easy bruising hypertension hypercholesterolemia.  ALLERGIES: Allergies  Allergen Reactions  . Lisinopril Other (See Comments)    cough  . Tramadol Other (See Comments)    Sleepiness, confusion    HOME MEDICATIONS: Outpatient Medications Prior to Visit  Medication Sig Dispense Refill  . brimonidine (ALPHAGAN) 0.2 % ophthalmic solution     . Calcium Citrate-Vitamin D (CALCIUM CITRATE + D3) 200-250 MG-UNIT TABS Take 1 tablet by mouth daily.    . Cholecalciferol (VITAMIN D PO)  Take 1,000 Int'l Units by mouth daily.    . fexofenadine (ALLEGRA) 180 MG tablet Take 180 mg by mouth daily.    Marland Kitchen FIBER PO Take by mouth.    . fluticasone (FLONASE) 50 MCG/ACT nasal spray Place 2 sprays into the nose daily.    . folic acid (FOLVITE) 509 MCG tablet Take 400 mcg by mouth daily.    Marland Kitchen glucosamine-chondroitin 500-400 MG tablet Take 1 tablet by mouth 3 (three) times daily.    Marland Kitchen latanoprost (XALATAN) 0.005 % ophthalmic solution Place 1 drop into both eyes at bedtime.    Marland Kitchen losartan (COZAAR) 25 MG tablet Take 25 mg by mouth daily.    . Multiple Vitamin (MULTIVITAMIN) tablet Take 1 tablet by mouth daily.    . Omega-3 Fatty Acids (FISH OIL) 1200 MG CAPS Take 1 capsule by mouth daily.    Marland Kitchen omeprazole (PRILOSEC) 20 MG capsule Take 20 mg by mouth daily.    . Probiotic Product (PROBIOTIC DAILY PO) Take by mouth.    . simvastatin (ZOCOR) 20 MG tablet Take 20 mg by mouth every evening.    . vitamin B-12 (CYANOCOBALAMIN) 1000 MCG tablet Take 1,000 mcg by mouth daily.    . vitamin C (ASCORBIC ACID) 500 MG tablet Take 500 mg by mouth daily.    . vitamin E 400 UNIT capsule Take 400 Units by mouth daily.    Marland Kitchen aspirin EC 81 MG tablet Take 81 mg by mouth daily.    Marland Kitchen BIOTIN 5000 PO Take by mouth.    . nystatin cream (MYCOSTATIN) Apply  1 application topically 2 (two) times daily. Apply to affected area BID for up to 7 days. 30 g 0   No facility-administered medications prior to visit.     PAST MEDICAL HISTORY: Past Medical History:  Diagnosis Date  . CKD (chronic kidney disease)    "moderate"  . Hyperlipidemia   . Hypertension   . Spinal stenosis     PAST SURGICAL HISTORY: Past Surgical History:  Procedure Laterality Date  . BREAST BIOPSY Left 1977   benign  . CATARACT EXTRACTION, BILATERAL Bilateral 2014  . CHOLECYSTECTOMY, LAPAROSCOPIC  2001  . TONSILLECTOMY    . TOTAL VAGINAL HYSTERECTOMY  1981   secondary to uterine prolapse    FAMILY HISTORY: Family History  Problem  Relation Age of Onset  . Breast cancer Sister   . Stroke Sister   . Kidney failure Mother   . Hypertension Mother   . Kidney failure Father   . Hypertension Father   . Colon cancer Daughter 7       resection of colon, had chemo and is now cancer free. at age 33 -2018  . Anuerysm Daughter 42  . Diabetes Daughter 43       treaated with oral med    SOCIAL HISTORY: Social History   Socioeconomic History  . Marital status: Widowed    Spouse name: Not on file  . Number of children: 1  . Years of education: 46  . Highest education level: Not on file  Occupational History  . Not on file  Social Needs  . Financial resource strain: Not on file  . Food insecurity:    Worry: Not on file    Inability: Not on file  . Transportation needs:    Medical: Not on file    Non-medical: Not on file  Tobacco Use  . Smoking status: Never Smoker  . Smokeless tobacco: Never Used  Substance and Sexual Activity  . Alcohol use: Yes    Alcohol/week: 2.0 standard drinks    Types: 2 Standard drinks or equivalent per week    Comment: wine on Friday nights  . Drug use: No  . Sexual activity: Never    Partners: Male    Birth control/protection: Surgical    Comment: hysterectomy  Lifestyle  . Physical activity:    Days per week: Not on file    Minutes per session: Not on file  . Stress: Not on file  Relationships  . Social connections:    Talks on phone: Not on file    Gets together: Not on file    Attends religious service: Not on file    Active member of club or organization: Not on file    Attends meetings of clubs or organizations: Not on file    Relationship status: Not on file  . Intimate partner violence:    Fear of current or ex partner: Not on file    Emotionally abused: Not on file    Physically abused: Not on file    Forced sexual activity: Not on file  Other Topics Concern  . Not on file  Social History Narrative   Lives alone, daughter lives at [place].  Good neighbor  support.  Husband passed at age 13 from heart disease and also was diabetic.   Caffeine- coffee, 1 cup daily, maybe occas soda     PHYSICAL EXAM   GENERAL EXAM/CONSTITUTIONAL: Vitals:  Vitals:   08/21/18 0843  BP: (!) 144/74  Pulse: 71  Weight: 120 lb  3.2 oz (54.5 kg)  Height: 5\' 2"  (1.575 m)     Body mass index is 21.98 kg/m. Wt Readings from Last 3 Encounters:  08/21/18 120 lb 3.2 oz (54.5 kg)  02/25/17 127 lb (57.6 kg)  02/24/16 130 lb (59 kg)     Patient is in no distress; well developed, nourished and groomed; neck is supple  CARDIOVASCULAR:  Examination of carotid arteries is normal; no carotid bruits  Regular rate and rhythm, no murmurs  Examination of peripheral vascular system by observation and palpation is normal  EYES:  Ophthalmoscopic exam of optic discs and posterior segments is normal; no papilledema or hemorrhages  Visual Acuity Screening   Right eye Left eye Both eyes  Without correction: 20/40 20/40   With correction:        MUSCULOSKELETAL:  Gait, strength, tone, movements noted in Neurologic exam below  NEUROLOGIC: MENTAL STATUS:  No flowsheet data found.  awake, alert, oriented to person, place and time  recent and remote memory intact  normal attention and concentration  language fluent, comprehension intact, naming intact  fund of knowledge appropriate  CRANIAL NERVE:   2nd - no papilledema on fundoscopic exam  2nd, 3rd, 4th, 6th - pupils equal and reactive to light, visual fields full to confrontation, extraocular muscles intact, no nystagmus  5th - facial sensation symmetric  7th - facial strength symmetric  8th - hearing intact  9th - palate elevates symmetrically, uvula midline  11th - shoulder shrug symmetric  12th - tongue protrusion midline  HIPOMIMIA  SOFT, SLIGHTLY HOARSE VOICE  MOTOR:   MILD RIGHT > LEFT HAND RESTING TREMOR  MILD POSTURAL TREMOR IN BUE  MILD MOUTH TREMOR  COGWHEELING AND  BRADYKINESIA IN RUE >> LUE  BRADYKINESIA RLE > LLU  full strength in the BUE, BLE  SENSORY:   normal and symmetric to light touch  ABSENT VIB AT ANKLES AND TOES  COORDINATION:   finger-nose-finger, fine finger movements SLOW  REFLEXES:   deep tendon reflexes TRACE and symmetric  GAIT/STATION:   RIGHT ARM SLIGHTLY FLEXED; NO ARM SWING WITH WALKING  SLIGHTLY STOOPED POSTURE  SMOOTH STRIDE, SHORT STEPS  SMOOTH TURNING     DIAGNOSTIC DATA (LABS, IMAGING, TESTING) - I reviewed patient records, labs, notes, testing and imaging myself where available.  No results found for: WBC, HGB, HCT, MCV, PLT No results found for: NA, K, CL, CO2, GLUCOSE, BUN, CREATININE, CALCIUM, PROT, ALBUMIN, AST, ALT, ALKPHOS, BILITOT, GFRNONAA, GFRAA No results found for: CHOL, HDL, LDLCALC, LDLDIRECT, TRIG, CHOLHDL No results found for: HGBA1C No results found for: VITAMINB12 No results found for: TSH   07/26/18 NM bone scan 1. Degenerative findings in the lower lumbar spine, shoulders, base of thumbs, knees, and left first MTP joint. 2. Dextroconvex lower thoracic and lumbar scoliosis. 3. Focal of accentuated activity in the right femur greater trochanter is nonspecific but likely reactive at the gluteal insertion site.    ASSESSMENT AND PLAN  84 y.o. year old female here with gradual onset progressive resting tremor, cogwheel rigidity, bradykinesia, balance difficulty, hypomania, lack of sense of smell, with signs and symptoms most consistent with idiopathic Parkinson's disease.  Patient also has history of lumbar spinal stenosis with neurogenic claudication, currently being managed conservatively.  Reviewed diagnosis, prognosis and treatment options.  Reviewed community resources with patient.   Dx:  1. Parkinson's disease ([place])   2. Resting tremor   3. Spinal stenosis of lumbar region with neurogenic claudication     PLAN:  -  check MRI brain (rule out other secondary  causes)  - start carbidopa / levodopa 25/100 half tab three times a day x 2 weeks; then 1 tab three times a day; take 30 minutes before meals  - refer to PT for balance (PD and lumbar spinal stenosis)  Orders Placed This Encounter  Procedures  . MR BRAIN WO CONTRAST   Meds ordered this encounter  Medications  . carbidopa-levodopa (SINEMET IR) 25-100 MG tablet    Sig: Take 1 tablet by mouth 3 (three) times daily before meals.    Dispense:  90 tablet    Refill:  12   Return in about 4 months (around 12/22/2018).    Penni Bombard, MD 12/90/4753, 3:91 AM Certified in Neurology, Neurophysiology and Neuroimaging  [facility] [address] [place], [place] [address] [phone]

## 2018-08-21 NOTE — Telephone Encounter (Signed)
Pt is aware of this and she also has their number of [phone] to call if she has not heard from them in the next 2-3 business days.

## 2018-08-24 ENCOUNTER — Telehealth: Payer: Self-pay | Admitting: Diagnostic Neuroimaging

## 2018-08-24 NOTE — Telephone Encounter (Signed)
Error

## 2018-09-08 ENCOUNTER — Ambulatory Visit
Admission: RE | Admit: 2018-09-08 | Discharge: 2018-09-08 | Disposition: A | Discharge status: Home / Self Care | Payer: Medicare Other | Source: Ambulatory Visit | Attending: Diagnostic Neuroimaging | Admitting: Diagnostic Neuroimaging

## 2018-09-08 DIAGNOSIS — G20 Parkinson's disease: Secondary | ICD-10-CM

## 2018-09-14 ENCOUNTER — Ambulatory Visit: Payer: Medicare Other | Attending: Diagnostic Neuroimaging | Admitting: Physical Therapy

## 2018-09-14 ENCOUNTER — Other Ambulatory Visit: Payer: Self-pay

## 2018-09-14 ENCOUNTER — Encounter: Payer: Self-pay | Admitting: Physical Therapy

## 2018-09-14 DIAGNOSIS — R42 Dizziness and giddiness: Secondary | ICD-10-CM

## 2018-09-14 DIAGNOSIS — R29818 Other symptoms and signs involving the nervous system: Secondary | ICD-10-CM | POA: Diagnosis not present

## 2018-09-14 DIAGNOSIS — R2681 Unsteadiness on feet: Secondary | ICD-10-CM | POA: Diagnosis not present

## 2018-09-14 DIAGNOSIS — M6281 Muscle weakness (generalized): Secondary | ICD-10-CM | POA: Insufficient documentation

## 2018-09-14 NOTE — Therapy (Signed)
[facility] [address] [place], [place], [address] Phone: [phone]   Fax:  [phone]  Physical Therapy Evaluation  Patient Details  Name: Tracy Bernard MRN: [id number] Date of Birth: 12/03/1930 Referring Provider (PT): Penni Bombard, MD   Encounter Date: 09/14/2018  PT End of Session - 09/14/18 1256    Visit Number  1    Number of Visits  9    Date for PT Re-Evaluation  10/14/18    Authorization Type  Medicare; [employer] 100% coverage    Authorization Time Period  09/14/18 to 12/13/2018    PT Start Time  1108    PT Stop Time  1150    PT Time Calculation (min)  42 min    Equipment Utilized During Treatment  Gait belt    Activity Tolerance  Patient tolerated treatment well    Behavior During Therapy  [facility] [place] for tasks assessed/performed       Past Medical History:  Diagnosis Date  . CKD (chronic kidney disease)    "moderate"  . Hyperlipidemia   . Hypertension   . Spinal stenosis     Past Surgical History:  Procedure Laterality Date  . BREAST BIOPSY Left 1977   benign  . CATARACT EXTRACTION, BILATERAL Bilateral 2014  . CHOLECYSTECTOMY, LAPAROSCOPIC  2001  . TONSILLECTOMY    . TOTAL VAGINAL HYSTERECTOMY  1981   secondary to uterine prolapse    There were no vitals filed for this visit.   Subjective Assessment - 09/14/18 1111    Subjective  Balance has been worse. Dizziness worse since new medicine (mainly in the mornings) lasts for about an hour or two after taking Sinemet. Did have some dizziness prior to starting medicine but just a little here and there.     Pertinent History  CKD, HTN, spinal stenosis,  neurogenic claudication    Currently in Pain?  No/denies         [facility] PT Assessment - 09/14/18 1113      Assessment   Medical Diagnosis  Parkinson's; spinal stenosis with neurogenic claudication    Referring Provider (PT)  Leta Baptist, Earlean Polka, MD    Onset Date/Surgical Date  --   diagnosed  08/21/18   Prior Therapy  none for balance or PD; + for neck/back      Precautions   Precautions  Fall      Balance Screen   Has the patient fallen in the past 6 months  Yes    How many times?  2   in the driveway and in the bathroom; both times sudden head    Has the patient had a decrease in activity level because of a fear of falling?   No    Is the patient reluctant to leave their home because of a fear of falling?   No      Home Social worker  Private residence    Living Arrangements  Alone    Available Help at Discharge  Other (Comment)   daughter lives at [place] to enter    Entrance Stairs-Number of Steps  1    [place]  --   rail around [employer]  Two level;Able to live on main level with bedroom/bathroom    Alternate Level Stairs-Number of Steps  16    Alternate Level Stairs-Rails  Left    Home  Equipment  Cane - quad;Grab bars - toilet   takes tub bath     Prior Function   Level of Independence  Independent    Leisure  like to go out to eat; read; play with ipad      Cognition   Overall Cognitive Status  Within Functional Limits for tasks assessed      Observation/Other Assessments   Observations  walks from lobby with small, short steps, narrow BOS      Sensation   Additional Comments  tingling burning in both feet; rLE sciatic pain      Coordination   Gross Motor Movements are Fluid and Coordinated  Yes    Fine Motor Movements are Fluid and Coordinated  No   rapid toe taps asynchronous and degrade     Posture/Postural Control   Posture/Postural Control  Postural limitations    Postural Limitations  Forward head;Increased lumbar lordosis      ROM / Strength   AROM / PROM / Strength  AROM;Strength      AROM   Overall AROM   Deficits    Overall AROM Comments  tightness bil hamstrings with seated knee ext      Strength   Overall Strength  Deficits    Overall  Strength Comments  Knee extension 3+, knee flexion 3, DF 4/5,       Transfers   Transfers  Sit to Stand;Stand to Sit    Sit to Stand  7: Independent    Five time sit to stand comments   14.00   no UEs   Stand to Sit  7: Independent      Ambulation/Gait   Ambulation/Gait  Yes    Ambulation/Gait Assistance  7: Independent    Ambulation Distance (Feet)  100 Feet   80, 60   Assistive device  None    Gait Pattern  Step-through pattern;Decreased arm swing - right;Decreased arm swing - left;Decreased stride length;Narrow base of support;Decreased trunk rotation    Ambulation Surface  Level;Indoor    Gait velocity  20/5.96=3.36 ft/sec        Mini-BESTest: [employer] Test  2005-2013 [employer]. All rights reserved. ________________________________________________________________________________________Anticipatory_________Subscore__5___/6 1. SIT TO STAND Instruction: "Cross your arms across your chest. Try not to use your hands unless you must.Do not let your legs lean against the back of the chair when you stand. Please stand up now." x(2) Normal: Comes to stand without use of hands and stabilizes independently. (1) Moderate: Comes to stand WITH use of hands on first attempt. (0) Severe: Unable to stand up from chair without assistance, OR needs several attempts with use of hands. 2. RISE TO TOES Instruction: "Place your feet shoulder width apart. Place your hands on your hips. Try to rise as high as you can onto your toes. I will count out loud to 3 seconds. Try to hold this pose for at least 3 seconds. Look straight ahead. Rise now." x(2) Normal: Stable for 3 s with maximum height. (1) Moderate: Heels up, but not full range (smaller than when holding hands), OR noticeable instability for 3 s. (0) Severe: < 3 s. 3. STAND ON ONE LEG Instruction: "Look straight ahead. Keep your hands on your hips. Lift your leg off of the ground behind you without  touching or resting your raised leg upon your other standing leg. Stay standing on one leg as long as you can. Look straight ahead. Lift now." Left: Time in Seconds Trial 1:__8.7___Trial  2:___5.0__ (2) Normal: 20 s. x(1) Moderate: < 20 s. (0) Severe: Unable. Right: Time in Seconds Trial 1:___11.0__Trial 2:  8.4 (2) Normal: 20 s. x(1) Moderate: < 20 s. (0) Severe: Unable To score each side separately use the trial with the longest time. To calculate the sub-score and total score use the side [left or right] with the lowest numerical score [i.e. the worse side]. ______________________________________________________________________________________Reactive Postural Control___________Subscore:_4____/6 4. COMPENSATORY STEPPING CORRECTION- FORWARD Instruction: "Stand with your feet shoulder width apart, arms at your sides. Lean forward against my hands beyond your forward limits. When I let go, do whatever is necessary, including taking a step, to avoid a fall." (2) Normal: Recovers independently with a single, large step (second realignment step is allowed). x(1) Moderate: More than one step used to recover equilibrium. (0) Severe: No step, OR would fall if not caught, OR falls spontaneously. 5. COMPENSATORY STEPPING CORRECTION- BACKWARD Instruction: "Stand with your feet shoulder width apart, arms at your sides. Lean backward against my hands beyond your backward limits. When I let go, do whatever is necessary, including taking a step, to avoid a fall." (2) Normal: Recovers independently with a single, large step. x(1) Moderate: More than one step used to recover equilibrium. (0) Severe: No step, OR would fall if not caught, OR falls spontaneously. 6. COMPENSATORY STEPPING CORRECTION- LATERAL Instruction: "Stand with your feet together, arms down at your sides. Lean into my hand beyond your sideways limit. When I let go, do whatever is necessary, including taking a step, to avoid a  fall." Left x(2) Normal: Recovers independently with 1 step (crossover or lateral OK). (1) Moderate: Several steps to recover equilibrium. (0) Severe: Falls, or cannot step. Right x(2) Normal: Recovers independently with 1 step (crossover or lateral OK). (1) Moderate: Several steps to recover equilibrium. (0) Severe: Falls, or cannot step. Use the side with the lowest score to calculate sub-score and total score. ____________________________________________________________________________________Sensory Orientation_____________Subscore:____4_____/6 7. STANCE (FEET TOGETHER); EYES OPEN, FIRM SURFACE Instruction: "Place your hands on your hips. Place your feet together until almost touching. Look straight ahead. Be as stable and still as possible, until I say stop." Time in seconds:________ x(2) Normal: 30 s. (1) Moderate: < 30 s. (0) Severe: Unable. 8. STANCE (FEET TOGETHER); EYES CLOSED, FOAM SURFACE Instruction: "Step onto the foam. Place your hands on your hips. Place your feet together until almost touching. Be as stable and still as possible, until I say stop. I will start timing when you close your eyes." Time in seconds:________ (2) Normal: 30 s. (1) Moderate: < 30 s. x(0) Severe: Unable. 9. INCLINE- EYES CLOSED Instruction: "Step onto the incline ramp. Please stand on the incline ramp with your toes toward the top. Place your feet shoulder width apart and have your arms down at your sides. I will start timing when you close your eyes." Time in seconds:________ x(2) Normal: Stands independently 30 s and aligns with gravity. (1) Moderate: Stands independently <30 s OR aligns with surface. (0) Severe: Unable. _________________________________________________________________________________________Dynamic Gait ______Subscore_____7___/10 10. CHANGE IN GAIT SPEED Instruction: "Begin walking at your normal speed, when I tell you 'fast', walk as fast as you can. When I say 'slow',  walk very slowly." x(2) Normal: Significantly changes walking speed without imbalance. (1) Moderate: Unable to change walking speed or signs of imbalance. (0) Severe: Unable to achieve significant change in walking speed AND signs of imbalance. 11. WALK WITH HEAD TURNS - HORIZONTAL Instruction: "Begin walking at your normal speed, when I say "right", turn your  head and look to the right. When I say "left" turn your head and look to the left. Try to keep yourself walking in a straight line." (2) Normal: performs head turns with no change in gait speed and good balance. x(1) Moderate: performs head turns with reduction in gait speed. (0) Severe: performs head turns with imbalance. 12. WALK WITH PIVOT TURNS Instruction: "Begin walking at your normal speed. When I tell you to 'turn and stop', turn as quickly as you can, face the opposite direction, and stop. After the turn, your feet should be close together." (2) Normal: Turns with feet close FAST (< 3 steps) with good balance. x(1) Moderate: Turns with feet close SLOW (>4 steps) with good balance. (0) Severe: Cannot turn with feet close at any speed without imbalance. 13. STEP OVER OBSTACLES Instruction: "Begin walking at your normal speed. When you get to the box, step over it, not around it and keep walking." x(2) Normal: Able to step over box with minimal change of gait speed and with good balance. (1) Moderate: Steps over box but touches box OR displays cautious behavior by slowing gait. (0) Severe: Unable to step over box OR steps around box. 14. TIMED UP & GO WITH DUAL TASK [3 METER WALK] Instruction TUG: "When I say 'Go', stand up from chair, walk at your normal speed across the tape on the floor, turn around, and come back to sit in the chair." Instruction TUG with Dual Task: "Count backwards by threes starting at ___. When I say 'Go', stand up from chair, walk at your normal speed across the tape on the floor, turn around, and come  back to sit in the chair. Continue counting backwards the entire time." TUG: ___10.37_____seconds; Dual Task TUG: __12.18______seconds (2) Normal: No noticeable change in sitting, standing or walking while backward counting when compared to TUG without Dual Task. x(1) Moderate: Dual Task affects either counting OR walking (>10%) when compared to the TUG without Dual Task. (0) Severe: Stops counting while walking OR stops walking while counting. When scoring item 14, if subject's gait speed slows more than 10% between the TUG without and with a Dual Task the score should be decreased by a point. TOTAL SCORE: ______20__/28          Objective measurements completed on examination: See above findings.              PT Education - 09/14/18 1248    Education Details  results of assessments; PT POC including potential further assessment of vestibular system due to reported mechanics of falls    Person(s) Educated  Patient    Methods  Explanation    Comprehension  Verbalized understanding          PT Long Term Goals - 09/14/18 1409      PT LONG TERM GOAL #1   Title  Patient will be independent with HEP addressing balance and quality of movements (TARGET: 10/14/2018)    Time  4    Period  Weeks    Status  New    Target Date  10/14/18      PT LONG TERM GOAL #2   Title  Patient will be able to verbalize understanding of Parkinson's community resources and exercise classes.     Time  4    Period  Weeks    Status  New      PT LONG TERM GOAL #3   Title  Patient will improve mini-BEST score to >=24/28 demonstrating improved  balance and reduced fall risk.    Time  4    Period  Weeks    Status  New      PT LONG TERM GOAL #4   Title  Patient will be referred to OT or SLP services if deemed appropriate. If not, will agree to Parkinson's screen in 6 months and be scheduled at time of discharge.    Time  4    Period  Weeks    Status  New             Plan -  09/14/18 1303    Clinical Impression Statement  Patient referred to OPPT due to recently diagnosed Parkinson's Disease with decreased balance and history of falls. Patient scored 20/28 on Mini-BEST test which assesses balance in many functional contexts with pt having most difficulty with compensatory strategies after loss of balance (she takes small, shuffling steps in attempts to recover) and when relying on her vestibular system for balance (on foam with eyes closed). Based on her history of falls and her test results, she does have an increased risk of falling and can benefit from PT to improve her balance and gait. See additional problems/deficits and planned PT interventions listed below.     History and Personal Factors relevant to plan of care:  PMH-CKD, HTN, spinal stenosis, Parkinson's disease (diagnosed 08/21/18)  Personal factors-lives alone with limited local support    Clinical Presentation  Evolving    Clinical Presentation due to:  newly diagnosed Parkinson's with recent change in medications causing incr dizziness    Clinical Decision Making  Moderate    Rehab Potential  Good    PT Frequency  2x / week    PT Duration  4 weeks    PT Treatment/Interventions  ADLs/Self Care Home Management;Gait training;DME Instruction;Functional mobility training;Therapeutic activities;Therapeutic exercise;Balance training;Neuromuscular re-education;Manual techniques;Patient/family education;Passive range of motion;Vestibular    PT Next Visit Plan  quick vestibular assessment (2 falls related to quick head turns vs turning body--could also be due to narrow BOS?); initiate HEP (?stand PWR); educate on fall prevention & Parkinson's resources & importance of exercise    Consulted and Agree with Plan of Care  Patient       Patient will benefit from skilled therapeutic intervention in order to improve the following deficits and impairments:  Abnormal gait, Decreased balance, Decreased knowledge of use of  DME, Decreased coordination, Decreased range of motion, Decreased strength, Dizziness, Impaired flexibility, Postural dysfunction  Visit Diagnosis: Dizziness and giddiness  Unsteadiness on feet  Muscle weakness (generalized)  Other symptoms and signs involving the nervous system     Problem List Patient Active Problem List   Diagnosis Date Noted  . Vitamin D deficiency 12/17/2014  . Atrophic vaginitis 12/17/2014  . HTN (hypertension) 09/10/2013  . Hypercholesterolemia 09/10/2013  . Postmenopausal HRT (hormone replacement therapy) 09/10/2013    Rexanne Mano, PT 09/14/2018, 2:16 PM  [facility] [address] [place], [place], [address] Phone: [phone]   Fax:  [phone]  Name: AAVYA SHAFER MRN: [id number] Date of Birth: [birth year]

## 2018-09-19 ENCOUNTER — Telehealth: Payer: Self-pay | Admitting: *Deleted

## 2018-09-19 NOTE — Telephone Encounter (Signed)
-----   Message from Penni Bombard, MD sent at 09/15/2018  2:47 PM EST ----- Atrophy and chronic small vessel ischemic disease. No other major findings. Please call patient. Continue current plan. -VRP

## 2018-09-19 NOTE — Telephone Encounter (Signed)
LMVM for pt to return call for MRI results. Pt returned call.  I gave her the results of her MRI per Dr. Leta Baptist, atrophy and chronic SVD, no major findings.  Continue current plan.  She verbalized understanding.  Eating heart healthy diet, exercise, decrease stress, take medications prescribed.

## 2018-09-21 ENCOUNTER — Ambulatory Visit: Payer: Medicare Other | Admitting: Physical Therapy

## 2018-09-21 ENCOUNTER — Encounter: Payer: Self-pay | Admitting: Physical Therapy

## 2018-09-21 DIAGNOSIS — R29818 Other symptoms and signs involving the nervous system: Secondary | ICD-10-CM | POA: Diagnosis not present

## 2018-09-21 DIAGNOSIS — M6281 Muscle weakness (generalized): Secondary | ICD-10-CM

## 2018-09-21 DIAGNOSIS — R2681 Unsteadiness on feet: Secondary | ICD-10-CM | POA: Diagnosis not present

## 2018-09-21 DIAGNOSIS — R42 Dizziness and giddiness: Secondary | ICD-10-CM | POA: Diagnosis not present

## 2018-09-22 NOTE — Therapy (Signed)
[facility] [address] [place], [place], [address] Phone: [phone]   Fax:  [phone]  Physical Therapy Treatment  Patient Details  Name: Tracy Bernard MRN: [id number] Date of Birth: 01/09/1931 Referring Provider (PT): Penni Bombard, MD   Encounter Date: 09/21/2018  PT End of Session - 09/22/18 1242    Visit Number  2    Number of Visits  9    Date for PT Re-Evaluation  10/14/18    Authorization Type  Medicare; [employer] 100% coverage    Authorization Time Period  09/14/18 to 12/13/2018    PT Start Time  1110    PT Stop Time  1153    PT Time Calculation (min)  43 min    Equipment Utilized During Treatment  --    Activity Tolerance  Patient tolerated treatment well    Behavior During Therapy  [facility] for tasks assessed/performed       Past Medical History:  Diagnosis Date  . CKD (chronic kidney disease)    "moderate"  . Hyperlipidemia   . Hypertension   . Spinal stenosis     Past Surgical History:  Procedure Laterality Date  . BREAST BIOPSY Left 1977   benign  . CATARACT EXTRACTION, BILATERAL Bilateral 2014  . CHOLECYSTECTOMY, LAPAROSCOPIC  2001  . TONSILLECTOMY    . TOTAL VAGINAL HYSTERECTOMY  1981   secondary to uterine prolapse    There were no vitals filed for this visit.  Subjective Assessment - 09/21/18 1113    Subjective  With the Sinemet she has gone up to 1 pill 3x/day. Thinks it has helped her tremor, but not anything else. States she was given a packet of information re: Parkinson's Disease by her MD when diagnosed and hasn't looked through it. Doesn't think she is going to try the support group.     Pertinent History  CKD, HTN, spinal stenosis,  neurogenic claudication    Patient Stated Goals  improve my balance and walking    Currently in Pain?  No/denies                       [facility] Adult PT Treatment/Exercise - 09/21/18 1700      Ambulation/Gait   Ambulation/Gait  Assistance  5: Supervision    Ambulation/Gait Assistance Details  vc for UE swing, wider stance to improve balance    Ambulation Distance (Feet)  180 Feet    Assistive device  None    Gait Pattern  Step-through pattern;Decreased arm swing - right;Decreased arm swing - left;Decreased stride length;Narrow base of support;Decreased trunk rotation      Self-Care   Self-Care  Other Self-Care Comments    Other Self-Care Comments   Discussed recent Parkinson's diagnosis and educated generally on community resources that will be of benefit for her moving forward (Support groups, exercise classes, online information). She reports her MD gave her a packet of information that she has not yet tried to look through, therefore did not provide handouts today as pt seems a bit overwhelmed at this time.         [facility]) - 09/21/18 1700    PWR! exercises  Moves in sitting;Moves in standing    PWR! Up  5    PWR! Rock  10    PWR Step  5   legs only   Comments  after seated PWR, attempted some standing    PWR! Up  10    PWR!  Rock  14    PWR! Twist  10    PWR! Step  6    Comments  Patient with h/o back pain and instructed to make lower steps with PWR step and limited # of reps          PT Education - 09/21/18 1700    Education Details  seated PWR! exercises for HEP (ability to watch video online for further instruction); benefits and rationale behind PWR exercises    Person(s) Educated  Patient    Methods  Explanation;Demonstration;Tactile cues;Verbal cues;Handout    Comprehension  Verbalized understanding;Returned demonstration;Verbal cues required;Tactile cues required;Need further instruction          PT Long Term Goals - 09/14/18 1409      PT LONG TERM GOAL #1   Title  Patient will be independent with HEP addressing balance and quality of movements (TARGET: 10/14/2018)    Time  4    Period  Weeks    Status  New    Target Date  10/14/18      PT LONG TERM GOAL #2   Title  Patient  will be able to verbalize understanding of Parkinson's community resources and exercise classes.     Time  4    Period  Weeks    Status  New      PT LONG TERM GOAL #3   Title  Patient will improve mini-BEST score to >=24/28 demonstrating improved balance and reduced fall risk.    Time  4    Period  Weeks    Status  New      PT LONG TERM GOAL #4   Title  Patient will be referred to OT or SLP services if deemed appropriate. If not, will agree to Parkinson's screen in 6 months and be scheduled at time of discharge.    Time  4    Period  Weeks    Status  New            Plan - 09/22/18 1250    Clinical Impression Statement  Session initially focused on education re: benefits and importance of exercise in patients with PD, Parkinson's resources in our community, and types of Parkinsons exercise classes in our community. Utilized walking for warm-up while providing vc for wider stance (pt very narrow with nearly scissoring at times) and attention to incr arm swing. Remainder of time spent intitiating HEP using seated PWR! exercises. As this was pt's first atttempt, she required incr time and cues (verbal, visual, and tactile) for all exercises. Patient demonstrated that she is eager to continue PT and improve her balance, posture and overall mobility.     Rehab Potential  Good    PT Frequency  2x / week    PT Duration  4 weeks    PT Treatment/Interventions  ADLs/Self Care Home Management;Gait training;DME Instruction;Functional mobility training;Therapeutic activities;Therapeutic exercise;Balance training;Neuromuscular re-education;Manual techniques;Patient/family education;Passive range of motion;Vestibular    PT Next Visit Plan  ?warm-up on [place] and discuss steps per minute, ? begin using her stationary bike at home; review seated PWR; educate on fall prevention; provide Parkinson's resources/newsletter    Consulted and Agree with Plan of Care  Patient       Patient will benefit from  skilled therapeutic intervention in order to improve the following deficits and impairments:  Abnormal gait, Decreased balance, Decreased knowledge of use of DME, Decreased coordination, Decreased range of motion, Decreased strength, Dizziness, Impaired flexibility, Postural dysfunction  Visit Diagnosis: Unsteadiness on  feet  Muscle weakness (generalized)  Other symptoms and signs involving the nervous system     Problem List Patient Active Problem List   Diagnosis Date Noted  . Vitamin D deficiency 12/17/2014  . Atrophic vaginitis 12/17/2014  . HTN (hypertension) 09/10/2013  . Hypercholesterolemia 09/10/2013  . Postmenopausal HRT (hormone replacement therapy) 09/10/2013    Rexanne Mano, PT 09/22/2018, 1:06 PM  [facility] [address] [place], [place], [address] Phone: [phone]   Fax:  [phone]  Name: Tracy Bernard MRN: [id number] Date of Birth: January 11, 1931

## 2018-09-26 DIAGNOSIS — H401131 Primary open-angle glaucoma, bilateral, mild stage: Secondary | ICD-10-CM | POA: Diagnosis not present

## 2018-09-26 DIAGNOSIS — Z961 Presence of intraocular lens: Secondary | ICD-10-CM | POA: Diagnosis not present

## 2018-09-27 DIAGNOSIS — D1801 Hemangioma of skin and subcutaneous tissue: Secondary | ICD-10-CM | POA: Diagnosis not present

## 2018-09-27 DIAGNOSIS — L821 Other seborrheic keratosis: Secondary | ICD-10-CM | POA: Diagnosis not present

## 2018-09-27 DIAGNOSIS — L814 Other melanin hyperpigmentation: Secondary | ICD-10-CM | POA: Diagnosis not present

## 2018-09-27 DIAGNOSIS — L218 Other seborrheic dermatitis: Secondary | ICD-10-CM | POA: Diagnosis not present

## 2018-09-27 DIAGNOSIS — Z85828 Personal history of other malignant neoplasm of skin: Secondary | ICD-10-CM | POA: Diagnosis not present

## 2018-09-28 ENCOUNTER — Ambulatory Visit: Payer: Medicare Other | Admitting: Physical Therapy

## 2018-09-28 DIAGNOSIS — R29818 Other symptoms and signs involving the nervous system: Secondary | ICD-10-CM

## 2018-09-28 DIAGNOSIS — M6281 Muscle weakness (generalized): Secondary | ICD-10-CM | POA: Diagnosis not present

## 2018-09-28 DIAGNOSIS — R42 Dizziness and giddiness: Secondary | ICD-10-CM | POA: Diagnosis not present

## 2018-09-28 DIAGNOSIS — R2681 Unsteadiness on feet: Secondary | ICD-10-CM | POA: Diagnosis not present

## 2018-09-29 NOTE — Therapy (Signed)
[facility] [address] [place], [place], [address] Phone: [phone]   Fax:  [phone]  Physical Therapy Treatment  Patient Details  Name: Tracy Bernard MRN: [id number] Date of Birth: January 09, 1931 Referring Provider (PT): Penni Bombard, MD   Encounter Date: 09/28/2018  PT End of Session - 09/29/18 1609    Visit Number  3    Number of Visits  9    Date for PT Re-Evaluation  10/14/18    Authorization Type  Medicare; [employer] 100% coverage    Authorization Time Period  09/14/18 to 12/13/2018    PT Start Time  1402    PT Stop Time  1448    PT Time Calculation (min)  46 min       Past Medical History:  Diagnosis Date  . CKD (chronic kidney disease)    "moderate"  . Hyperlipidemia   . Hypertension   . Spinal stenosis     Past Surgical History:  Procedure Laterality Date  . BREAST BIOPSY Left 1977   benign  . CATARACT EXTRACTION, BILATERAL Bilateral 2014  . CHOLECYSTECTOMY, LAPAROSCOPIC  2001  . TONSILLECTOMY    . TOTAL VAGINAL HYSTERECTOMY  1981   secondary to uterine prolapse    There were no vitals filed for this visit.  Subjective Assessment - 09/29/18 1554    Subjective  Pt states she has been up since 3:30 am due to waking up and unable to go back to sleep -having more dizziness than usual - pt states dizziness fluctuates on daily basis     Pertinent History  CKD, HTN, spinal stenosis,  neurogenic claudication    Patient Stated Goals  improve my balance and walking                       [facility] Adult PT Treatment/Exercise - 09/29/18 0001      Ambulation/Gait   Ambulation/Gait  Yes    Ambulation/Gait Assistance  4: Min guard    Ambulation/Gait Assistance Details  pt amb. tossing/catching ball for improved dynamic balance with mulit-tasking with gait     Ambulation Distance (Feet)  115 Feet    Assistive device  None    Gait Pattern  Step-through pattern;Decreased arm swing -  right;Decreased arm swing - left;Decreased stride length;Narrow base of support;Decreased trunk rotation    Ambulation Surface  Level;Indoor      Exercises   Exercises  Lumbar      Lumbar Exercises: Stretches   Active Hamstring Stretch  Right;Left;1 rep;30 seconds   Runner's stretch performed at counter   Other Lumbar Stretch Exercise  Pt performed corner stretch for pectoral stretch x 2 reps in corner - 30 sec hold to decrease rounded shoulder posture     Other Lumbar Stretch Exercise  Pt performed "Y" stretch on wall - 30 sec hold then added standing on tip toes with 2nd rep for trunk stretch         PWR [facility]) - 09/29/18 1600    PWR! Rock  10   with reaching   PWR Step  10 reps for stepping forward, back and side with 1 UE movement and other hand on counter for support    Reaching Comments  10    PWR! Up  10    PWR! Twist  10    PWR! Step  10    around corner of mat table      Balance Exercises - 09/29/18 1605  Balance Exercises: Standing   Tandem Stance  Eyes open;1 rep   53' - with CGA   Rockerboard  Anterior/posterior;Lateral;10 reps;Intermittent UE support    Retro Gait  1 rep   20' tossing ball - CGA to min assist for balance   Turning  Both   4 reps - 2 reps on each end of // bars   Other Standing Exercises  tap ups to 1st and 2nd step with UE support 10 rep each foot              PT Long Term Goals - 09/14/18 1409      PT LONG TERM GOAL #1   Title  Patient will be independent with HEP addressing balance and quality of movements (TARGET: 10/14/2018)    Time  4    Period  Weeks    Status  New    Target Date  10/14/18      PT LONG TERM GOAL #2   Title  Patient will be able to verbalize understanding of Parkinson's community resources and exercise classes.     Time  4    Period  Weeks    Status  New      PT LONG TERM GOAL #3   Title  Patient will improve mini-BEST score to >=24/28 demonstrating improved balance and reduced fall risk.    Time  4     Period  Weeks    Status  New      PT LONG TERM GOAL #4   Title  Patient will be referred to OT or SLP services if deemed appropriate. If not, will agree to Parkinson's screen in 6 months and be scheduled at time of discharge.    Time  4    Period  Weeks    Status  New            Plan - 09/29/18 1610    Clinical Impression Statement  Pt needed demonstrational and verbal cues for performing standing PWR! with some difficulty performing weight shift with wide BOS.  Pt amb. with decreased step length with narrow BOS but able to decrease deviations with cues.  Pt needed min CGA for safety with standing balance activities, performing exercises with min. LOB.      Rehab Potential  Good    PT Frequency  2x / week    PT Duration  4 weeks    PT Treatment/Interventions  ADLs/Self Care Home Management;Gait training;DME Instruction;Functional mobility training;Therapeutic activities;Therapeutic exercise;Balance training;Neuromuscular re-education;Manual techniques;Patient/family education;Passive range of motion;Vestibular    PT Next Visit Plan  ?warm-up on Nustep and discuss steps per minute, ? begin using her stationary bike at home; review seated PWR; educate on fall prevention; provide Parkinson's resources/newsletter    Consulted and Agree with Plan of Care  Patient       Patient will benefit from skilled therapeutic intervention in order to improve the following deficits and impairments:  Abnormal gait, Decreased balance, Decreased knowledge of use of DME, Decreased coordination, Decreased range of motion, Decreased strength, Dizziness, Impaired flexibility, Postural dysfunction  Visit Diagnosis: Unsteadiness on feet  Muscle weakness (generalized)  Other symptoms and signs involving the nervous system     Problem List Patient Active Problem List   Diagnosis Date Noted  . Vitamin D deficiency 12/17/2014  . Atrophic vaginitis 12/17/2014  . HTN (hypertension) 09/10/2013  .  Hypercholesterolemia 09/10/2013  . Postmenopausal HRT (hormone replacement therapy) 09/10/2013    Waver Dibiasio, [place], PT 09/29/2018, 4:18 PM  [facility] [address] [place], [place], [address] Phone: [phone]   Fax:  [phone]  Name: MCKENSIE SCOTTI MRN: [id number] Date of Birth: [birth year]

## 2018-10-10 ENCOUNTER — Encounter: Payer: Self-pay | Admitting: Physical Therapy

## 2018-10-10 ENCOUNTER — Ambulatory Visit: Payer: Medicare Other | Attending: Diagnostic Neuroimaging | Admitting: Physical Therapy

## 2018-10-10 ENCOUNTER — Telehealth: Payer: Self-pay | Admitting: Diagnostic Neuroimaging

## 2018-10-10 VITALS — BP 168/84

## 2018-10-10 DIAGNOSIS — R2681 Unsteadiness on feet: Secondary | ICD-10-CM | POA: Diagnosis not present

## 2018-10-10 DIAGNOSIS — R29818 Other symptoms and signs involving the nervous system: Secondary | ICD-10-CM | POA: Diagnosis not present

## 2018-10-10 DIAGNOSIS — M6281 Muscle weakness (generalized): Secondary | ICD-10-CM | POA: Insufficient documentation

## 2018-10-10 DIAGNOSIS — R42 Dizziness and giddiness: Secondary | ICD-10-CM | POA: Diagnosis not present

## 2018-10-10 NOTE — Telephone Encounter (Signed)
Spoke with patient who stated that in past two weeks her BP has ranged in 170's/80's. She has doubled her losartan, taking twice a day. She stated her PCP "told her she could do that if her BP was high". She was in rehab today and was told her BP was high. The lower number was in 90's. She has had dizziness as well. This RN advised she call her PCP and let her know about her BP readings and that she has increased her losartan. Advised her this RN will discuss with Dr Leta Baptist and let her know if he advises she reduce carbi-levo doses. Advised she may not get a call back until tomorrow. She verbalized understanding, appreciation.

## 2018-10-10 NOTE — Telephone Encounter (Signed)
Pt requesting a call stating that medication  carbidopa-levodopa (SINEMET IR) 25-100 MG tablet is making her BP high, causing dizzy spells. Please call to advise

## 2018-10-10 NOTE — Therapy (Signed)
[facility] [address] [place], [place], [address] Phone: [phone]   Fax:  [phone]  Physical Therapy Treatment  Patient Details  Name: Tracy Bernard MRN: [id number] Date of Birth: 01/30/1931 Referring Provider (PT): Penni Bombard, MD   Encounter Date: 10/10/2018  PT End of Session - 10/10/18 1407    Visit Number  4    Number of Visits  9    Date for PT Re-Evaluation  10/14/18    Authorization Type  Medicare; [employer] 100% coverage    Authorization Time Period  09/14/18 to 12/13/2018    PT Start Time  1406   pt late arrival   PT Stop Time  1445    PT Time Calculation (min)  39 min    Activity Tolerance  Patient tolerated treatment well    Behavior During Therapy  [facility] for tasks assessed/performed       Past Medical History:  Diagnosis Date  . CKD (chronic kidney disease)    "moderate"  . Hyperlipidemia   . Hypertension   . Spinal stenosis     Past Surgical History:  Procedure Laterality Date  . BREAST BIOPSY Left 1977   benign  . CATARACT EXTRACTION, BILATERAL Bilateral 2014  . CHOLECYSTECTOMY, LAPAROSCOPIC  2001  . TONSILLECTOMY    . TOTAL VAGINAL HYSTERECTOMY  1981   secondary to uterine prolapse    Vitals:   10/10/18 1412 10/10/18 1427  BP: (!) 164/98 (!) 168/84    Subjective Assessment - 10/10/18 1408    Subjective  Woke up last night ~1:30 am very dizzy 8/10. "lightheaded" Now just feels shakey. Reports BP was high this morning (178/70s). Plans to call her doctor due to BP varying from low normal to high after starting Sinemet.     Pertinent History  CKD, HTN, spinal stenosis,  neurogenic claudication    Patient Stated Goals  improve my balance and walking    Currently in Pain?  No/denies                       [facility] Adult PT Treatment/Exercise - 10/10/18 0001      Ambulation/Gait   Ambulation/Gait Assistance  5: Supervision;4: Min guard    Ambulation/Gait Assistance  Details  vc for UE swing; tactile cues at shoulders to esp incr RUE swing; varying speed    Ambulation Distance (Feet)  360 Feet    Assistive device  None    Gait Pattern  Step-through pattern;Decreased arm swing - right;Decreased arm swing - left;Decreased stride length;Narrow base of support;Decreased trunk rotation        PWR [facility]) - 10/10/18 1437    PWR! exercises  Moves in sitting    PWR! Rock  20    Comments  10 with single UE support on counter while opp UE reaches; 10 no UE support with minguard    PWR! Up  20    PWR! Rock  20    PWR! Twist  10    PWR! Step  5    Comments  minor cue for technique for Up and Twist; mod cues Rock; max cues Step (done at corner of mat table       Balance Exercises - 10/10/18 1435      Balance Exercises: Standing   Rockerboard  Anterior/posterior;EO;Intermittent UE support   ankle, hip, and step strategy; step on/off anterior/posterio       PT Education - 10/10/18 2008    Education  Details  recommend contact her MD re: elevated BP (see vital signs for today's BP during session); HEP    Person(s) Educated  Patient    Methods  Explanation;Demonstration;Verbal cues;Handout    Comprehension  Verbalized understanding;Returned demonstration;Verbal cues required;Need further instruction          PT Long Term Goals - 09/14/18 1409      PT LONG TERM GOAL #1   Title  Patient will be independent with HEP addressing balance and quality of movements (TARGET: 10/14/2018)    Time  4    Period  Weeks    Status  New    Target Date  10/14/18      PT LONG TERM GOAL #2   Title  Patient will be able to verbalize understanding of Parkinson's community resources and exercise classes.     Time  4    Period  Weeks    Status  New      PT LONG TERM GOAL #3   Title  Patient will improve mini-BEST score to >=24/28 demonstrating improved balance and reduced fall risk.    Time  4    Period  Weeks    Status  New      PT LONG TERM GOAL #4   Title   Patient will be referred to OT or SLP services if deemed appropriate. If not, will agree to Parkinson's screen in 6 months and be scheduled at time of discharge.    Time  4    Period  Weeks    Status  New            Plan - 10/10/18 1439    Clinical Impression Statement  Skilled session focused on technique for HEP (seated PWR! exercises), introducing standing PWR exercises, and gait training. Patient appears to be very dedicated to doing her HEP as it was apparent she had been practicing. She expressed interest in resuming use of her stationary bicycle, however is concerned re: getting onto seat. Advised that currently with her BP at times running high that I would not recommend resuming yet. Will plan to simulate getting onto a stationary cycle at future appt. Will also discuss upcoming end of 4th week on 10/20/18 and whether to recertify for additional visits    Rehab Potential  Good    PT Frequency  2x / week    PT Duration  4 weeks    PT Treatment/Interventions  ADLs/Self Care Home Management;Gait training;DME Instruction;Functional mobility training;Therapeutic activities;Therapeutic exercise;Balance training;Neuromuscular re-education;Manual techniques;Patient/family education;Passive range of motion;Vestibular    PT Next Visit Plan  monitor BP prior to exercise (recntly high);discuss 4th week ends 12/13 (she has appts scheduled beyond due to 1 visit per week initial 2 weeks; ?recertify for more than 2 visits; teach supine vs standing PWR; use Nustep and discuss steps per minute (for carryover to using her home stationary bike ultimately), educate on fall prevention; provide Parkinson's resources/newsletter    Consulted and Agree with Plan of Care  Patient       Patient will benefit from skilled therapeutic intervention in order to improve the following deficits and impairments:  Abnormal gait, Decreased balance, Decreased knowledge of use of DME, Decreased coordination, Decreased range  of motion, Decreased strength, Dizziness, Impaired flexibility, Postural dysfunction  Visit Diagnosis: Unsteadiness on feet  Other symptoms and signs involving the nervous system     Problem List Patient Active Problem List   Diagnosis Date Noted  . Vitamin D deficiency 12/17/2014  . Atrophic  vaginitis 12/17/2014  . HTN (hypertension) 09/10/2013  . Hypercholesterolemia 09/10/2013  . Postmenopausal HRT (hormone replacement therapy) 09/10/2013    Rexanne Mano, PT 10/10/2018, 8:28 PM  [facility] [address] [place], [place], [address] Phone: [phone]   Fax:  [phone]  Name: PIERINA SCHUKNECHT MRN: [id number] Date of Birth: 05-18-1931

## 2018-10-11 NOTE — Telephone Encounter (Signed)
Carb / levo not causing high BP. Needs PCP mgmt of BP. -VRP

## 2018-10-11 NOTE — Telephone Encounter (Signed)
Called patient and advised her of Dr [employer] message. She stated she would call her PCP to discuss. She verbalized understanding, appreciation.

## 2018-10-13 ENCOUNTER — Ambulatory Visit: Payer: Medicare Other | Admitting: Physical Therapy

## 2018-10-13 VITALS — BP 140/76

## 2018-10-13 DIAGNOSIS — M6281 Muscle weakness (generalized): Secondary | ICD-10-CM | POA: Diagnosis not present

## 2018-10-13 DIAGNOSIS — R2681 Unsteadiness on feet: Secondary | ICD-10-CM

## 2018-10-13 DIAGNOSIS — R29818 Other symptoms and signs involving the nervous system: Secondary | ICD-10-CM | POA: Diagnosis not present

## 2018-10-13 DIAGNOSIS — R42 Dizziness and giddiness: Secondary | ICD-10-CM | POA: Diagnosis not present

## 2018-10-13 NOTE — Therapy (Addendum)
[facility] [address] [place], [place], [address] Phone: [phone]   Fax:  [phone]  Physical Therapy Treatment  Patient Details  Name: Tracy Bernard MRN: [id number] Date of Birth: 27-Feb-1931 Referring Provider (PT): Penni Bombard, MD   Encounter Date: 10/13/2018  PT End of Session - 10/13/18 1205    Visit Number  5    Number of Visits  9    Date for PT Re-Evaluation  10/20/18   date extended due to missed week   Authorization Type  Medicare; [employer] 100% coverage    Authorization Time Period  09/14/18 to 12/13/2018    PT Start Time  1104    PT Stop Time  1145    PT Time Calculation (min)  41 min    Activity Tolerance  Patient tolerated treatment well    Behavior During Therapy  [facility] for tasks assessed/performed       Past Medical History:  Diagnosis Date  . CKD (chronic kidney disease)    "moderate"  . Hyperlipidemia   . Hypertension   . Spinal stenosis     Past Surgical History:  Procedure Laterality Date  . BREAST BIOPSY Left 1977   benign  . CATARACT EXTRACTION, BILATERAL Bilateral 2014  . CHOLECYSTECTOMY, LAPAROSCOPIC  2001  . TONSILLECTOMY    . TOTAL VAGINAL HYSTERECTOMY  1981   secondary to uterine prolapse    Vitals:   10/13/18 1141  BP: 140/76    Subjective Assessment - 10/13/18 1208    Subjective  reports she called Dr. Leta Baptist re: elevated BP and seemed to coincide with starting Sinemet (he informed her that would not cause elevated BP and to call her  PCP). She did and was told to take 2 BP pills instead of one unless her BP was running low (she has monitor and checks at home).     Pertinent History  CKD, HTN, spinal stenosis,  neurogenic claudication    Patient Stated Goals  improve my balance and walking    Currently in Pain?  No/denies                       [facility] Adult PT Treatment/Exercise - 10/13/18 0001      Exercises   Exercises  Other Exercises    Other Exercises   simulated getting on/off her stationary bicycle at home and pt with no difficulty      Knee/Hip Exercises: Aerobic   Nustep  7 minutes with 1.5 min warm-up, then 30 sec intervals pushing to 50, 60, 70 steps/min        PWR [facility]) - 10/13/18 1145    PWR! exercises  Moves in standing    PWR! Up  15    PWR! Rock  20    PWR! Twist  10    PWR Step  10   one set bil UE support at table; 2nd set in corner no UE sup   Comments  all ex's at counter (or corner of table for step--progressed to standing in corner  and adding UE reaching forward; 1 LOB posteriorly requiring wall support to recover      Patient required incr time with vc and tactile cues for proper technique with standing PWR exercises. Patient with LOB posteriorly when attempted standing PWR step without UE support. Currently pt only has sitting PWR exercises for HEP.      PT Education - 10/13/18 1201    Education Details  benefits of cycling and PD; OK to begin using home stationary bike for 5-7 minutes and incr to 10 minutes and ultimately 30 minutes. Needs to monitor her BP pre-activity and post-activity for tolerance. Parkinson's Cycling classes available for free with paperwork completed by MD. Could be a good program for her to learn ow to structure her time at home on the cyclle.    Person(s) Educated  Patient    Methods  Explanation    Comprehension  Verbalized understanding          PT Long Term Goals - 10/13/18 1213      PT LONG TERM GOAL #1   Title  Patient will be independent with HEP addressing balance and quality of movements (TARGET: 10/14/2018--updated to 10/20/18 due to missed one week)    Time  4    Period  Weeks    Status  New      PT LONG TERM GOAL #2   Title  Patient will be able to verbalize understanding of Parkinson's community resources and exercise classes.     Time  4    Period  Weeks    Status  New      PT LONG TERM GOAL #3   Title  Patient will improve mini-BEST score  to >=24/28 demonstrating improved balance and reduced fall risk.    Time  4    Period  Weeks    Status  New      PT LONG TERM GOAL #4   Title  Patient will be referred to OT or SLP services if deemed appropriate. If not, will agree to Parkinson's screen in 6 months and be scheduled at time of discharge.    Time  4    Period  Weeks    Status  New           10/13/18 1644  Plan  Clinical Impression Statement Skilled session focused on monitoring BP with a normal response to exercise (using seated stepper with intervals for increasing steps/minute). Educated on standing PWR! exercises--modified at counter or corner of table. Simulated pt gettting on/off a stationary bike to assure pt is safe to use her stationary bike at home. Did not assign standing PWR exercises for HEP as pt with decr balance during modified standing exercises. Will continue to practice these during PT and educate pt in supine PWR for HEP.   Pt will benefit from skilled therapeutic intervention in order to improve on the following deficits Abnormal gait;Decreased balance;Decreased knowledge of use of DME;Decreased coordination;Decreased range of motion;Decreased strength;Dizziness;Impaired flexibility;Postural dysfunction  Rehab Potential Good  PT Frequency 2x / week  PT Duration 4 weeks  PT Treatment/Interventions ADLs/Self Care Home Management;Gait training;DME Instruction;Functional mobility training;Therapeutic activities;Therapeutic exercise;Balance training;Neuromuscular re-education;Manual techniques;Patient/family education;Passive range of motion;Vestibular  PT Next Visit Plan  monitor BP prior to exercise (recntly high); use Nustep to warm-up and pratice incr intensity; educate on supine PWR, practice standing PWR;  plan to chk goals 37/16 and re-certify;, educate on fall prevention; provide Parkinson's resources/newsletter  Consulted and Agree with Plan of Care Patient        Patient will benefit from skilled  therapeutic intervention in order to improve the following deficits and impairments:     Visit Diagnosis: Other symptoms and signs involving the nervous system  Unsteadiness on feet     Problem List Patient Active Problem List   Diagnosis Date Noted  . Vitamin D deficiency 12/17/2014  . Atrophic vaginitis 12/17/2014  . HTN (hypertension) 09/10/2013  .  Hypercholesterolemia 09/10/2013  . Postmenopausal HRT (hormone replacement therapy) 09/10/2013    Jeanie Cooks Vanita Cannell. PT 10/13/2018, 12:14 PM  [facility] [address] [place], [place], [address] Phone: [phone]   Fax:  [phone]  Name: Tracy Bernard MRN: [id number] Date of Birth: 1931-08-17

## 2018-10-16 ENCOUNTER — Ambulatory Visit: Payer: Medicare Other | Admitting: Physical Therapy

## 2018-10-16 ENCOUNTER — Encounter: Payer: Self-pay | Admitting: Physical Therapy

## 2018-10-16 DIAGNOSIS — R2681 Unsteadiness on feet: Secondary | ICD-10-CM | POA: Diagnosis not present

## 2018-10-16 DIAGNOSIS — R29818 Other symptoms and signs involving the nervous system: Secondary | ICD-10-CM

## 2018-10-16 DIAGNOSIS — R42 Dizziness and giddiness: Secondary | ICD-10-CM | POA: Diagnosis not present

## 2018-10-16 DIAGNOSIS — M6281 Muscle weakness (generalized): Secondary | ICD-10-CM

## 2018-10-16 NOTE — Therapy (Addendum)
[facility] [address] [place], [place], [address] Phone: [phone]   Fax:  [phone]  Physical Therapy Treatment  Patient Details  Name: Tracy Bernard MRN: [id number] Date of Birth: 11-28-1930 Referring Provider (PT): Penni Bombard, MD   Encounter Date: 10/16/2018  PT End of Session - 10/16/18 1120    Visit Number  6   Number of Visits  9    Date for PT Re-Evaluation  10/20/18   date extended due to missed week   Authorization Type  Medicare; [employer] 100% coverage    Authorization Time Period  09/14/18 to 12/13/2018    PT Start Time  1110    PT Stop Time  1152    PT Time Calculation (min)  42 min    Activity Tolerance  Patient tolerated treatment well    Behavior During Therapy  [facility] for tasks assessed/performed       Past Medical History:  Diagnosis Date  . CKD (chronic kidney disease)    "moderate"  . Hyperlipidemia   . Hypertension   . Spinal stenosis     Past Surgical History:  Procedure Laterality Date  . BREAST BIOPSY Left 1977   benign  . CATARACT EXTRACTION, BILATERAL Bilateral 2014  . CHOLECYSTECTOMY, LAPAROSCOPIC  2001  . TONSILLECTOMY    . TOTAL VAGINAL HYSTERECTOMY  1981   secondary to uterine prolapse    There were no vitals filed for this visit.  Subjective Assessment - 10/16/18 1112    Subjective  Was feeling light-headed this morning (has become her usual). Takes BP medicine at night per MD direction. Notes her BP was lower this morning (SBP 90s), however she is feeling better now and BP had come up.     Pertinent History  CKD, HTN, spinal stenosis,  neurogenic claudication    Patient Stated Goals  improve my balance and walking    Currently in Pain?  No/denies                       [facility] Adult PT Treatment/Exercise - 10/16/18 1116      Ambulation/Gait   Ambulation/Gait Assistance  4: Min guard    Ambulation/Gait Assistance Details  slow walking to emphasize  UE swing which was challenging for pt (the coordination and the slow pace with incr instability--multiple LOB with pt recovering independently    Ambulation Distance (Feet)  200 Feet    Assistive device  None      Knee/Hip Exercises: Aerobic   Nustep  5. minutes wiht all 4 extremities; warmup 60-70 RPM, 30 second intervals with max RPM 91 (1 minute rests between) and 1 minute cool down        P[facility]) - 10/16/18 1128    PWR! exercises  Moves in supine    PWR! Up  10   arms at side with little lift; arms in T and better lift   PWR! Rock  20   bending knee of pushing leg   PWR! Twist  20   bending knee of pushing leg   PWR! Step  10    Comments  first time instructing in supine PWR wtith incr time and cues for each exercise          PT Education - 10/16/18 1200    Education Details  added supine PWR to HEP; provided fall prevention handout and went over the "highlights" with pt to finish reading and surveying at home  Person(s) Educated  Patient    Methods  Explanation;Demonstration;Tactile cues;Verbal cues;Handout    Comprehension  Verbalized understanding;Returned demonstration;Verbal cues required;Tactile cues required;Need further instruction          PT Long Term Goals - 10/13/18 1213      PT LONG TERM GOAL #1   Title  Patient will be independent with HEP addressing balance and quality of movements (TARGET: 10/14/2018--updated to 10/20/18 due to missed one week)    Time  4    Period  Weeks    Status  New      PT LONG TERM GOAL #2   Title  Patient will be able to verbalize understanding of Parkinson's community resources and exercise classes.     Time  4    Period  Weeks    Status  New      PT LONG TERM GOAL #3   Title  Patient will improve mini-BEST score to >=24/28 demonstrating improved balance and reduced fall risk.    Time  4    Period  Weeks    Status  New      PT LONG TERM GOAL #4   Title  Patient will be referred to OT or SLP services if deemed  appropriate. If not, will agree to Parkinson's screen in 6 months and be scheduled at time of discharge.    Time  4    Period  Weeks    Status  New            Plan - 10/16/18 1202    Clinical Impression Statement  Skilled session focused on instruction in supine PWR exercises to promote incr trunk ROM and strength. Pt demonstrated good shoulder and back ROM in general. Required modification to bend her knees to assist with pushing to lift hip off the surface (educated the goal is to get strong enough to press into heel to roll). Additional focus on increasing UE swing and trunk rotation in walking for immproved balance and velocity. Pt with significant difficulty with slow gait and focus on UE swing. May try assisted UE swing by PT next visit.     Rehab Potential  Good    PT Frequency  2x / week    PT Duration  4 weeks    PT Treatment/Interventions  ADLs/Self Care Home Management;Gait training;DME Instruction;Functional mobility training;Therapeutic activities;Therapeutic exercise;Balance training;Neuromuscular re-education;Manual techniques;Patient/family education;Passive range of motion;Vestibular    PT Next Visit Plan   monitor BP prior to exercise (recntly high); use Nustep to warm-up and pratice incr intensity; review supine PWR, practice standing PWR at counter;  plan to chk goals 35/46 and re-certify provide Parkinson's resources/newsletter    Consulted and Agree with Plan of Care  Patient       Patient will benefit from skilled therapeutic intervention in order to improve the following deficits and impairments:  Abnormal gait, Decreased balance, Decreased knowledge of use of DME, Decreased coordination, Decreased range of motion, Decreased strength, Dizziness, Impaired flexibility, Postural dysfunction  Visit Diagnosis: Other symptoms and signs involving the nervous system  Muscle weakness (generalized)     Problem List Patient Active Problem List   Diagnosis Date Noted   . Vitamin D deficiency 12/17/2014  . Atrophic vaginitis 12/17/2014  . HTN (hypertension) 09/10/2013  . Hypercholesterolemia 09/10/2013  . Postmenopausal HRT (hormone replacement therapy) 09/10/2013    Rexanne Mano, PT 10/16/2018, 12:07 PM  [facility] [address] [place], [place], [address] Phone: [phone]  Fax:  [phone]  Name: Tracy Bernard MRN: [id number] Date of Birth: Oct 14, 1931

## 2018-10-16 NOTE — Patient Instructions (Signed)
Fall Prevention in the Home Falls can cause injuries and can affect people from all age groups. There are many simple things that you can do to make your home safe and to help prevent falls. What can I do on the outside of my home?  Regularly repair the edges of walkways and driveways and fix any cracks.  Remove high doorway thresholds.  Trim any shrubbery on the main path into your home.  Use bright outdoor lighting.  Clear walkways of debris and clutter, including tools and rocks.  Regularly check that handrails are securely fastened and in good repair. Both sides of any steps should have handrails.  Install guardrails along the edges of any raised decks or porches.  Have leaves, snow, and ice cleared regularly.  Use sand or salt on walkways during winter months.  In the garage, clean up any spills right away, including grease or oil spills. What can I do in the bathroom?  Use night lights.  Install grab bars by the toilet and in the tub and shower. Do not use towel bars as grab bars.  Use non-skid mats or decals on the floor of the tub or shower.  If you need to sit down while you are in the shower, use a plastic, non-slip stool.  Keep the floor dry. Immediately clean up any water that spills on the floor.  Remove soap buildup in the tub or shower on a regular basis.  Attach bath mats securely with double-sided non-slip rug tape.  Remove throw rugs and other tripping hazards from the floor. What can I do in the bedroom?  Use night lights.  Make sure that a bedside light is easy to reach.  Do not use oversized bedding that drapes onto the floor.  Have a firm chair that has side arms to use for getting dressed.  Remove throw rugs and other tripping hazards from the floor. What can I do in the kitchen?  Clean up any spills right away.  Avoid walking on wet floors.  Place frequently used items in easy-to-reach places.  If you need to reach for something above  you, use a sturdy step stool that has a grab bar.  Keep electrical cables out of the way.  Do not use floor polish or wax that makes floors slippery. If you have to use wax, make sure that it is non-skid floor wax.  Remove throw rugs and other tripping hazards from the floor. What can I do in the stairways?  Do not leave any items on the stairs.  Make sure that there are handrails on both sides of the stairs. Fix handrails that are broken or loose. Make sure that handrails are as long as the stairways.  Check any carpeting to make sure that it is firmly attached to the stairs. Fix any carpet that is loose or worn.  Avoid having throw rugs at the top or bottom of stairways, or secure the rugs with carpet tape to prevent them from moving.  Make sure that you have a light switch at the top of the stairs and the bottom of the stairs. If you do not have them, have them installed. What are some other fall prevention tips?  Wear closed-toe shoes that fit well and support your feet. Wear shoes that have rubber soles or low heels.  When you use a stepladder, make sure that it is completely opened and that the sides are firmly locked. Have someone hold the ladder while you are using   it. Do not climb a closed stepladder.  Add color or contrast paint or tape to grab bars and handrails in your home. Place contrasting color strips on the first and last steps.  Use mobility aids as needed, such as canes, walkers, scooters, and crutches.  Turn on lights if it is dark. Replace any light bulbs that burn out.  Set up furniture so that there are clear paths. Keep the furniture in the same spot.  Fix any uneven floor surfaces.  Choose a carpet design that does not hide the edge of steps of a stairway.  Be aware of any and all pets.  Review your medicines with your healthcare provider. Some medicines can cause dizziness or changes in blood pressure, which increase your risk of falling. Talk with  your health care provider about other ways that you can decrease your risk of falls. This may include working with a physical therapist or trainer to improve your strength, balance, and endurance. This information is not intended to replace advice given to you by your health care provider. Make sure you discuss any questions you have with your health care provider. Document Released: 10/15/2002 Document Revised: 03/23/2016 Document Reviewed: 11/29/2014 Elsevier Interactive Patient Education  2018 Elsevier Inc[employer]  

## 2018-10-19 ENCOUNTER — Ambulatory Visit: Payer: Medicare Other | Admitting: Physical Therapy

## 2018-10-19 ENCOUNTER — Encounter: Payer: Self-pay | Admitting: Physical Therapy

## 2018-10-19 VITALS — BP 110/68

## 2018-10-19 DIAGNOSIS — M6281 Muscle weakness (generalized): Secondary | ICD-10-CM

## 2018-10-19 DIAGNOSIS — R29818 Other symptoms and signs involving the nervous system: Secondary | ICD-10-CM | POA: Diagnosis not present

## 2018-10-19 DIAGNOSIS — R2681 Unsteadiness on feet: Secondary | ICD-10-CM

## 2018-10-19 DIAGNOSIS — R42 Dizziness and giddiness: Secondary | ICD-10-CM | POA: Diagnosis not present

## 2018-10-19 NOTE — Therapy (Signed)
[facility] [address] [place], [place], [address] Phone: [phone]   Fax:  [phone]  Physical Therapy Treatment  Patient Details  Name: Tracy Bernard MRN: [id number] Date of Birth: [birth year] Referring Provider (PT): Penni Bombard, MD   Encounter Date: 10/19/2018  PT End of Session - 10/19/18 1108    Visit Number  7   corrected count 12/12   Number of Visits  9    Date for PT Re-Evaluation  10/20/18   date extended due to missed week   Authorization Type  Medicare; [employer] 100% coverage    Authorization Time Period  09/14/18 to 12/13/2018    PT Start Time  1105   in restroom   PT Stop Time  1146    PT Time Calculation (min)  41 min    Activity Tolerance  Patient tolerated treatment well    Behavior During Therapy  [facility] for tasks assessed/performed       Past Medical History:  Diagnosis Date  . CKD (chronic kidney disease)    "moderate"  . Hyperlipidemia   . Hypertension   . Spinal stenosis     Past Surgical History:  Procedure Laterality Date  . BREAST BIOPSY Left 1977   benign  . CATARACT EXTRACTION, BILATERAL Bilateral 2014  . CHOLECYSTECTOMY, LAPAROSCOPIC  2001  . TONSILLECTOMY    . TOTAL VAGINAL HYSTERECTOMY  1981   secondary to uterine prolapse    Vitals:   10/19/18 1106  BP: 110/68    Subjective Assessment - 10/19/18 1106    Subjective  Sometimes her new meds upset her stomach--like this morning. She has not discussed with Dr. Leta Baptist as "it's not that bad." Asking to review supine PWR! exercises recently added to HEP    Pertinent History  CKD, HTN, spinal stenosis,  neurogenic claudication    Patient Stated Goals  improve my balance and walking    Currently in Pain?  Yes    Pain Score  5     Pain Location  Back    Pain Orientation  Lower    Pain Descriptors / Indicators  Sharp    Pain Type  Chronic pain    Pain Onset  More than a month ago    Pain Frequency  Intermittent     Pain Relieving Factors  nothing                       [facility] Adult PT Treatment/Exercise - 10/19/18 1149      Bed Mobility   Bed Mobility  Rolling Right;Rolling Left;Right Sidelying to Sit;Sit to Sidelying Right    Rolling Right  Independent    Rolling Left  Independent    Right Sidelying to Sit  Independent      Self-Care   Self-Care  Other Self-Care Comments    Other Self-Care Comments   provided January Parkinson's Support Group newsletter and reviewed categories of information with focus on [employer] class and need to contact Cline Crock for form her MD needs to fill out prior to participating. (phone # and email provided)        P[facility]) - 10/19/18 1116    PWR! exercises  Moves in supine    PWR! Up  5   max cues, better movement   PWR! Rock  20   min cues   PWR! Twist  20   min cues for RUE staying straight and down on bed  PWR! Step  10   max cues   PWR! Up  10    PWR! Rock  20    PWR Step  20    Comments  at counter and progressing to not touching counter with pt demonstrating no LOB          PT Education - 10/19/18 1152    Education Details  reviewed technique for supine PWR exercises again; with cues with good technique; Parkinson's local resources via newsletter    Person(s) Educated  Patient    Methods  Explanation;Demonstration;Verbal cues;Handout    Comprehension  Verbalized understanding;Returned demonstration;Verbal cues required;Need further instruction          PT Long Term Goals - 10/13/18 1213      PT LONG TERM GOAL #1   Title  Patient will be independent with HEP addressing balance and quality of movements (TARGET: 10/14/2018--updated to 10/20/18 due to missed one week)    Time  4    Period  Weeks    Status  New      PT LONG TERM GOAL #2   Title  Patient will be able to verbalize understanding of Parkinson's community resources and exercise classes.     Time  4    Period  Weeks    Status  New      PT LONG  TERM GOAL #3   Title  Patient will improve mini-BEST score to >=24/28 demonstrating improved balance and reduced fall risk.    Time  4    Period  Weeks    Status  New      PT LONG TERM GOAL #4   Title  Patient will be referred to OT or SLP services if deemed appropriate. If not, will agree to Parkinson's screen in 6 months and be scheduled at time of discharge.    Time  4    Period  Weeks    Status  New            Plan - 10/19/18 1944    Clinical Impression Statement  Instructed in correct technique for supine PWR! exercises (recently added to HEP) and standing PWR! exercises. Patient reported back pain 5/10 at beginning of session and at end of session reported 0/10 pain. Discussed plan to check LTGs and do recertification next visit.     Rehab Potential  Good    PT Frequency  2x / week    PT Duration  4 weeks    PT Treatment/Interventions  ADLs/Self Care Home Management;Gait training;DME Instruction;Functional mobility training;Therapeutic activities;Therapeutic exercise;Balance training;Neuromuscular re-education;Manual techniques;Patient/family education;Passive range of motion;Vestibular    PT Next Visit Plan   [place] from 06/26 and re-certify; monitor BP prior to exercise (recntly high); use Nustep to warm-up and pratice incr intensity; review supine PWR, practice standing PWR at counter;     Consulted and Agree with Plan of Care  Patient       Patient will benefit from skilled therapeutic intervention in order to improve the following deficits and impairments:  Abnormal gait, Decreased balance, Decreased knowledge of use of DME, Decreased coordination, Decreased range of motion, Decreased strength, Dizziness, Impaired flexibility, Postural dysfunction  Visit Diagnosis: Other symptoms and signs involving the nervous system  Muscle weakness (generalized)  Unsteadiness on feet     Problem List Patient Active Problem List   Diagnosis Date Noted  . Vitamin D  deficiency 12/17/2014  . Atrophic vaginitis 12/17/2014  . HTN (hypertension) 09/10/2013  . Hypercholesterolemia 09/10/2013  .  Postmenopausal HRT (hormone replacement therapy) 09/10/2013    Rexanne Mano, PT 10/19/2018, 7:49 PM  [facility] [address] [place], [place], [address] Phone: [phone]   Fax:  [phone]  Name: MYRTHA TONKOVICH MRN: [id number] Date of Birth: 1931-04-18

## 2018-10-23 ENCOUNTER — Encounter: Payer: Self-pay | Admitting: Physical Therapy

## 2018-10-23 ENCOUNTER — Ambulatory Visit: Payer: Medicare Other | Admitting: Physical Therapy

## 2018-10-23 DIAGNOSIS — R29818 Other symptoms and signs involving the nervous system: Secondary | ICD-10-CM | POA: Diagnosis not present

## 2018-10-23 DIAGNOSIS — M6281 Muscle weakness (generalized): Secondary | ICD-10-CM | POA: Diagnosis not present

## 2018-10-23 DIAGNOSIS — R42 Dizziness and giddiness: Secondary | ICD-10-CM

## 2018-10-23 DIAGNOSIS — R2681 Unsteadiness on feet: Secondary | ICD-10-CM

## 2018-10-23 NOTE — Therapy (Signed)
[facility] [address] [place], [place], [address] Phone: [phone]   Fax:  [phone]  Physical Therapy Treatment  Patient Details  Name: Tracy Bernard MRN: [id number] Date of Birth: [birth year] Referring Provider (PT): Penni Bombard, MD   Encounter Date: 10/23/2018  PT End of Session - 10/23/18 1156    Visit Number  8    Number of Visits  18    Date for PT Re-Evaluation  11/22/18   date extended due to missed week   Authorization Type  Medicare; [employer] 100% coverage    Authorization Time Period  09/14/18 to 12/13/2018; 10/23/18 to 01/21/2019    PT Start Time  1107    PT Stop Time  1147    PT Time Calculation (min)  40 min    Activity Tolerance  Patient tolerated treatment well    Behavior During Therapy  [facility] for tasks assessed/performed        Past Medical History:  Diagnosis Date  . CKD (chronic kidney disease)    "moderate"  . Hyperlipidemia   . Hypertension   . Spinal stenosis     Past Surgical History:  Procedure Laterality Date  . BREAST BIOPSY Left 1977   benign  . CATARACT EXTRACTION, BILATERAL Bilateral 2014  . CHOLECYSTECTOMY, LAPAROSCOPIC  2001  . TONSILLECTOMY    . TOTAL VAGINAL HYSTERECTOMY  1981   secondary to uterine prolapse    There were no vitals filed for this visit.  Subjective Assessment - 10/23/18 1109    Subjective  no changes or falls.     Pertinent History  CKD, HTN, spinal stenosis,  neurogenic claudication    Patient Stated Goals  improve my balance and walking    Currently in Pain?  No/denies    Pain Onset  More than a month ago           [employer]-BESTest: [employer]  2005-2013 [employer]. All rights reserved. ________________________________________________________________________________________Anticipatory_________Subscore___5__/6 1. SIT TO STAND Instruction: "Cross your arms across your chest. Try not to use  your hands unless you must.Do not let your legs lean against the back of the chair when you stand. Please stand up now." x(2) Normal: Comes to stand without use of hands and stabilizes independently. (1) Moderate: Comes to stand WITH use of hands on first attempt. (0) Severe: Unable to stand up from chair without assistance, OR needs several attempts with use of hands. 2. RISE TO TOES Instruction: "Place your feet shoulder width apart. Place your hands on your hips. Try to rise as high as you can onto your toes. I will count out loud to 3 seconds. Try to hold this pose for at least 3 seconds. Look straight ahead. Rise now." x(2) Normal: Stable for 3 s with maximum height. (1) Moderate: Heels up, but not full range (smaller than when holding hands), OR noticeable instability for 3 s. (0) Severe: < 3 s. 3. STAND ON ONE LEG Instruction: "Look straight ahead. Keep your hands on your hips. Lift your leg off of the ground behind you without touching or resting your raised leg upon your other standing leg. Stay standing on one leg as long as you can. Look straight ahead. Lift now." Left: Time in Seconds Trial 1:___6.9__Trial 2:___11.6__ (2) Normal: 20 s. (1) Moderate: < 20 s. (0) Severe: Unable. Right: Time in Seconds Trial 1:__1.3___Trial 2:___11.5__ (2) Normal: 20 s. (1) Moderate: < 20 s. (0) Severe: Unable To score each  side separately use the trial with the longest time. To calculate the sub-score and total score use the side [left or right] with the lowest numerical score [i.e. the worse side]. ______________________________________________________________________________________Reactive Postural Control___________Subscore:__3___/6 4. COMPENSATORY STEPPING CORRECTION- FORWARD Instruction: "Stand with your feet shoulder width apart, arms at your sides. Lean forward against my hands beyond your forward limits. When I let go, do whatever is necessary, including taking a step, to avoid a  fall." (2) Normal: Recovers independently with a single, large step (second realignment step is allowed). x(1) Moderate: More than one step used to recover equilibrium. (0) Severe: No step, OR would fall if not caught, OR falls spontaneously. 5. COMPENSATORY STEPPING CORRECTION- BACKWARD Instruction: "Stand with your feet shoulder width apart, arms at your sides. Lean backward against my hands beyond your backward limits. When I let go, do whatever is necessary, including taking a step, to avoid a fall." (2) Normal: Recovers independently with a single, large step. x(1) Moderate: More than one step used to recover equilibrium. (0) Severe: No step, OR would fall if not caught, OR falls spontaneously. 6. COMPENSATORY STEPPING CORRECTION- LATERAL Instruction: "Stand with your feet together, arms down at your sides. Lean into my hand beyond your sideways limit. When I let go, do whatever is necessary, including taking a step, to avoid a fall." Left (2) Normal: Recovers independently with 1 step (crossover or lateral OK). x(1) Moderate: Several steps to recover equilibrium. (0) Severe: Falls, or cannot step. Right x(2) Normal: Recovers independently with 1 step (crossover or lateral OK). (1) Moderate: Several steps to recover equilibrium. (0) Severe: Falls, or cannot step. Use the side with the lowest score to calculate sub-score and total score. ____________________________________________________________________________________Sensory Orientation_____________Subscore:____6_____/6 7. STANCE (FEET TOGETHER); EYES OPEN, FIRM SURFACE Instruction: "Place your hands on your hips. Place your feet together until almost touching. Look straight ahead. Be as stable and still as possible, until I say stop." Time in seconds:________ x(2) Normal: 30 s. (1) Moderate: < 30 s. (0) Severe: Unable. 8. STANCE (FEET TOGETHER); EYES CLOSED, FOAM SURFACE Instruction: "Step onto the foam. Place your hands on  your hips. Place your feet together until almost touching. Be as stable and still as possible, until I say stop. I will start timing when you close your eyes." Time in seconds:________ x(2) Normal: 30 s. (1) Moderate: < 30 s. (0) Severe: Unable. 9. INCLINE- EYES CLOSED Instruction: "Step onto the incline ramp. Please stand on the incline ramp with your toes toward the top. Place your feet shoulder width apart and have your arms down at your sides. I will start timing when you close your eyes." Time in seconds:________ x(2) Normal: Stands independently 30 s and aligns with gravity. (1) Moderate: Stands independently <30 s OR aligns with surface. (0) Severe: Unable. _________________________________________________________________________________________Dynamic Gait ______Subscore______8__/10 10. CHANGE IN GAIT SPEED Instruction: "Begin walking at your normal speed, when I tell you 'fast', walk as fast as you can. When I say 'slow', walk very slowly." x(2) Normal: Significantly changes walking speed without imbalance. (1) Moderate: Unable to change walking speed or signs of imbalance. (0) Severe: Unable to achieve significant change in walking speed AND signs of imbalance. [place] - HORIZONTAL Instruction: "Begin walking at your normal speed, when I say "right", turn your head and look to the right. When I say "left" turn your head and look to the left. Try to keep yourself walking in a straight line." (2) Normal: performs head turns with no change in gait  speed and good balance. x(1) Moderate: performs head turns with reduction in gait speed. (0) Severe: performs head turns with imbalance. 12. WALK WITH PIVOT TURNS Instruction: "Begin walking at your normal speed. When I tell you to 'turn and stop', turn as quickly as you can, face the opposite direction, and stop. After the turn, your feet should be close together." x(2) Normal: Turns with feet close FAST (< 3 steps)  with good balance. (1) Moderate: Turns with feet close SLOW (>4 steps) with good balance. (0) Severe: Cannot turn with feet close at any speed without imbalance. 13. STEP OVER OBSTACLES Instruction: "Begin walking at your normal speed. When you get to the box, step over it, not around it and keep walking." x(2) Normal: Able to step over box with minimal change of gait speed and with good balance. (1) Moderate: Steps over box but touches box OR displays cautious behavior by slowing gait. (0) Severe: Unable to step over box OR steps around box. 14. TIMED UP & GO WITH DUAL TASK [3 METER WALK] Instruction TUG: "When I say 'Go', stand up from chair, walk at your normal speed across the tape on the floor, turn around, and come back to sit in the chair." Instruction TUG with Dual Task: "Count backwards by threes starting at ___. When I say 'Go', stand up from chair, walk at your normal speed across the tape on the floor, turn around, and come back to sit in the chair. Continue counting backwards the entire time." TUG: _____9.5___seconds; Dual Task TUG: ___12.9_____seconds (2) Normal: No noticeable change in sitting, standing or walking while backward counting when compared to TUG without Dual Task. x(1) Moderate: Dual Task affects either counting OR walking (>10%) when compared to the TUG without Dual Task. (0) Severe: Stops counting while walking OR stops walking while counting. When scoring item 14, if subject's gait speed slows more than 10% between the TUG without and with a Dual Task the score should be decreased by a point. TOTAL SCORE: ____22____/28                 [facility]) - 10/23/18 1153    PWR! exercises  Moves in supine;Moves in standing    PWR! Step  4    Comments  to review technique per pt's request; correct sequence    PWR! Up  10    PWR! Rock  20    PWR! Twist  20    PWR Step  20    Comments  only step done at counter initially and progressed to not touching  counter with no LOB throughout          PT Education - 10/23/18 1155    Education Details  results of assessment; PT POC; services provided by OT or SLP with pt in agreement not yet necessary (and agrees to do screens in 6 mos after d/c)    Person(s) Educated  Patient    Methods  Explanation    Comprehension  Verbalized understanding          PT Long Term Goals - 10/23/18 1110      PT LONG TERM GOAL #1   Title  Patient will be independent with HEP addressing balance and quality of movements (TARGET: 10/14/2018--updated to 10/20/18 due to missed one week)    Time  4    Period  Weeks    Status  Achieved      PT LONG TERM GOAL #2   Title  Patient will  be able to verbalize understanding of Parkinson's community resources and exercise classes.     Baseline  10/23/18 Has information and not sure if she will go to group class    Time  4    Period  Weeks    Status  Achieved      PT LONG TERM GOAL #3   Title  Patient will improve mini-BEST score to >=24/28 demonstrating improved balance and reduced fall risk.    Baseline  12/16     22/28    Time  4    Period  Weeks    Status  Partially Met      PT LONG TERM GOAL #4   Title  Patient will be referred to OT or SLP services if deemed appropriate. If not, will agree to Parkinson's screen in 6 months and be scheduled at time of discharge.    Time  4    Period  Weeks    Status  Achieved          NEW STGs and LTGs:  PT Short Term Goals - 10/23/18 1232      PT SHORT TERM GOAL #1   Title  Patient will be independent with updated HEP (Target all STGs 11/22/2018)    Time  4    Period  Weeks    Status  New    Target Date  11/22/18      PT SHORT TERM GOAL #2   Title  Patient will improve TUG vs TUG cog difference to <=25% (last measured 38% difference) with pt counting backwards by 1's (could not do by 3's)    Time  4    Period  Weeks    Status  New      PT Long Term Goals - 10/23/18 1236      PT LONG TERM GOAL #1    Title  Patient will be independent with advanced HEP including knowledge of appropriate use of home stationary bike (TARGET all LTGs 12/22/2018)    Time  8    Period  Weeks    Status  New    Target Date  12/22/18      PT LONG TERM GOAL #2   Title  Patient will perform 15 minutes on recumbent stepper (to simulate use of her stationary bicycle at home) with appropriate intensity as measured by steps/minute.     Time  8    Period  Weeks    Status  New      PT LONG TERM GOAL #3   Title  Patient will improve mini-BEST score to >=24/28 demonstrating improved balance and reduced fall risk.    Time  8    Period  Weeks    Status  New           Plan - 10/23/18 1208    Clinical Impression Statement  LTGs assessed with pt meeting 3 of 4 goals and partially met (made progress, just not to goal level) the 4th goal. She is highly motivated and has been introduced to PWR over Parkinson's exercises (so far in sitting, supine, and recently standing). These exercises emphasize full ROM, posture,balance and intensity of movement to address specific Parkinson's deficits. Patient's technique with exercises continues to improve and can benefit from further PT to finalize her understanding of exercises and solidify her plan for continued exercises upon discharge from PT.     Rehab Potential  Good    PT Frequency  Other (comment)   2x/wk x  1 week; followed by 1 x week for 7 weeks   PT Duration  8 weeks    PT Treatment/Interventions  ADLs/Self Care Home Management;Gait training;DME Instruction;Functional mobility training;Therapeutic activities;Therapeutic exercise;Balance training;Neuromuscular re-education;Manual techniques;Patient/family education;Passive range of motion;Vestibular    PT Next Visit Plan   monitor BP prior to exercise (recntly high); use Nustep to warm-up and pratice incr intensity; practice standing PWR away from counter and assign for HEP (also make a schedule for her to rotate between  (supine, seated, standing)    Consulted and Agree with Plan of Care  Patient       Patient will benefit from skilled therapeutic intervention in order to improve the following deficits and impairments:  Abnormal gait, Decreased balance, Decreased knowledge of use of DME, Decreased coordination, Decreased range of motion, Decreased strength, Dizziness, Impaired flexibility, Postural dysfunction  Visit Diagnosis: Other symptoms and signs involving the nervous system  Unsteadiness on feet  Muscle weakness (generalized)  Dizziness and giddiness     Problem List Patient Active Problem List   Diagnosis Date Noted  . Vitamin D deficiency 12/17/2014  . Atrophic vaginitis 12/17/2014  . HTN (hypertension) 09/10/2013  . Hypercholesterolemia 09/10/2013  . Postmenopausal HRT (hormone replacement therapy) 09/10/2013    Rexanne Mano, PT 10/23/2018, 12:22 PM  [facility] [address] [place], [place], [address] Phone: [phone]   Fax:  [phone]  Name: Tracy Bernard MRN: [id number] Date of Birth: 05/11/1931

## 2018-10-26 ENCOUNTER — Encounter: Payer: Self-pay | Admitting: Physical Therapy

## 2018-10-26 ENCOUNTER — Ambulatory Visit: Payer: Medicare Other | Admitting: Physical Therapy

## 2018-10-26 VITALS — BP 134/92

## 2018-10-26 DIAGNOSIS — M6281 Muscle weakness (generalized): Secondary | ICD-10-CM | POA: Diagnosis not present

## 2018-10-26 DIAGNOSIS — R2681 Unsteadiness on feet: Secondary | ICD-10-CM

## 2018-10-26 DIAGNOSIS — R42 Dizziness and giddiness: Secondary | ICD-10-CM | POA: Diagnosis not present

## 2018-10-26 DIAGNOSIS — R29818 Other symptoms and signs involving the nervous system: Secondary | ICD-10-CM | POA: Diagnosis not present

## 2018-10-26 NOTE — Patient Instructions (Signed)
(  Exercise) Monday Tuesday Wednesday Thursday Friday Saturday Sunday   PWR Supine               PWR sitting              PWR standing

## 2018-10-26 NOTE — Therapy (Signed)
[facility] [address] [place], [place], [address] Phone: [phone]   Fax:  [phone]  Physical Therapy Treatment  Patient Details  Name: Tracy Bernard MRN: [id number] Date of Birth: December 16, 1930 Referring Provider (PT): Penni Bombard, MD   Encounter Date: 10/26/2018  PT End of Session - 10/26/18 1943    Visit Number  9    Number of Visits  18    Date for PT Re-Evaluation  11/22/18   date extended due to missed week   Authorization Type  Medicare; [employer] 100% coverage    Authorization Time Period  09/14/18 to 12/13/2018; 10/23/18 to 01/21/2019    PT Start Time  1105    PT Stop Time  1143    PT Time Calculation (min)  38 min    Activity Tolerance  Patient tolerated treatment well    Behavior During Therapy  [facility] for tasks assessed/performed       Past Medical History:  Diagnosis Date  . CKD (chronic kidney disease)    "moderate"  . Hyperlipidemia   . Hypertension   . Spinal stenosis     Past Surgical History:  Procedure Laterality Date  . BREAST BIOPSY Left 1977   benign  . CATARACT EXTRACTION, BILATERAL Bilateral 2014  . CHOLECYSTECTOMY, LAPAROSCOPIC  2001  . TONSILLECTOMY    . TOTAL VAGINAL HYSTERECTOMY  1981   secondary to uterine prolapse    Vitals:   10/26/18 1140  BP: (!) 134/92 (at end of session)    Subjective Assessment - 10/26/18 1112    Subjective  no changes or falls. Back was bothering her yesterday as she's been packing to go to her daughter's at [facility] for the holiday    Pertinent History  CKD, HTN, spinal stenosis,  neurogenic claudication    Patient Stated Goals  improve my balance and walking    Currently in Pain?  No/denies                       [facility] Adult PT Treatment/Exercise - 10/26/18 1115      Ambulation/Gait   Ambulation/Gait Assistance  5: Supervision;4: Min assist    Ambulation/Gait Assistance Details  vc for UE swing, however pt with decr  coordination of UEs when she focuses on UE swing; used walking poles with PT walking behind her to facilitate UE swing, esp RUE    Ambulation Distance (Feet)  150 Feet   x 2   Assistive device  None    Gait Pattern  Step-through pattern;Decreased arm swing - right;Decreased arm swing - left;Decreased stride length;Narrow base of support;Decreased trunk rotation      Lumbar Exercises: Aerobic   Nustep  4.5 minute warmup L4; 30 sec intervals x 3 with 45 sec slower pace between; 1.5 min cool down=total time 8 min 30 sec        [facility]) - 10/26/18 1152    PWR! Up  10    PWR! Rock  20    PWR! Twist  10    PWR Step  20    Comments  counter not used; initial PWR Step with feet only with progression to using arm for fwd reach and progressed to using bil UEs when stepping          PT Education - 10/26/18 1942    Education Details  standing PWR exercises and added to HEP; made an exercise grid with plan for how to alternate the  PWR exercises throughout the week    Person(s) Educated  Patient    Methods  Explanation;Demonstration;Verbal cues;Handout    Comprehension  Verbalized understanding;Returned demonstration;Verbal cues required;Need further instruction       PT Short Term Goals - 10/23/18 1232      PT SHORT TERM GOAL #1   Title  Patient will be independent with updated HEP (Target all STGs 11/22/2018)    Time  4    Period  Weeks    Status  New    Target Date  11/22/18      PT SHORT TERM GOAL #2   Title  Patient will improve TUG vs TUG cog difference to <=25% (last measured 38% difference) with pt counting backwards by 1's (could not do by 3's)    Time  4    Period  Weeks    Status  New        PT Long Term Goals - 10/23/18 1236      PT LONG TERM GOAL #1   Title  Patient will be independent with advanced HEP including knowledge of appropriate use of home stationary bike (TARGET all LTGs 12/22/2018)    Time  8    Period  Weeks    Status  New    Target Date  12/22/18       PT LONG TERM GOAL #2   Title  Patient will perform 15 minutes on recumbent stepper (to simulate use of her stationary bicycle at home) with appropriate intensity as measured by steps/minute.     Time  8    Period  Weeks    Status  New      PT LONG TERM GOAL #3   Title  Patient will improve mini-BEST score to >=24/28 demonstrating improved balance and reduced fall risk.    Time  8    Period  Weeks    Status  New            Plan - 10/26/18 1945    Clinical Impression Statement  Session focused on continued education in PWR exercises to address her Parkinson's related deficits. Patient with slow movements and multiple questions meaning it requires incr time to complete a set of exercises. Her technique with repetition continues to improve. Discussed rationale for each exercise as part of the education. Patient can continue to benefit from PT to achieve LTGs.     Rehab Potential  Good    PT Frequency  Other (comment)   2x/wk x 1 week; followed by 1 x week for 7 weeks   PT Duration  8 weeks    PT Treatment/Interventions  ADLs/Self Care Home Management;Gait training;DME Instruction;Functional mobility training;Therapeutic activities;Therapeutic exercise;Balance training;Neuromuscular re-education;Manual techniques;Patient/family education;Passive range of motion;Vestibular    PT Next Visit Plan   monitor BP prior to exercise (recntly high); use Nustep to warm-up and progress to 15 minutes with intervals for LTG and pratice incr intensity/intervals; practice supine, seated, or standing PWR; gait with cognitive challenges, assisted UE swing    Consulted and Agree with Plan of Care  Patient       Patient will benefit from skilled therapeutic intervention in order to improve the following deficits and impairments:  Abnormal gait, Decreased balance, Decreased knowledge of use of DME, Decreased coordination, Decreased range of motion, Decreased strength, Dizziness, Impaired flexibility,  Postural dysfunction  Visit Diagnosis: Other symptoms and signs involving the nervous system  Unsteadiness on feet  Muscle weakness (generalized)     Problem List Patient  Active Problem List   Diagnosis Date Noted  . Vitamin D deficiency 12/17/2014  . Atrophic vaginitis 12/17/2014  . HTN (hypertension) 09/10/2013  . Hypercholesterolemia 09/10/2013  . Postmenopausal HRT (hormone replacement therapy) 09/10/2013    Rexanne Mano, PT 10/26/2018, 7:51 PM  [facility] [address] [place], [place], [address] Phone: [phone]   Fax:  [phone]  Name: Tracy Bernard MRN: [id number] Date of Birth: [birth year]

## 2018-11-13 ENCOUNTER — Encounter: Payer: Self-pay | Admitting: Physical Therapy

## 2018-11-13 ENCOUNTER — Ambulatory Visit: Payer: Medicare Other | Attending: Family Medicine | Admitting: Physical Therapy

## 2018-11-13 DIAGNOSIS — R2681 Unsteadiness on feet: Secondary | ICD-10-CM | POA: Insufficient documentation

## 2018-11-13 DIAGNOSIS — R29818 Other symptoms and signs involving the nervous system: Secondary | ICD-10-CM | POA: Diagnosis not present

## 2018-11-13 DIAGNOSIS — M6281 Muscle weakness (generalized): Secondary | ICD-10-CM | POA: Diagnosis not present

## 2018-11-13 NOTE — Therapy (Signed)
[facility] [address] [place], [place], [address] Phone: [phone]   Fax:  [phone]  Physical Therapy Treatment and 10th visit progress note  Patient Details  Name: Tracy Bernard MRN: [id number] Date of Birth: 1931-08-30 Referring Provider (PT): Penni Bombard, MD   Encounter Date: 11/13/2018   This 10th visit note covers the timeframe: 09/14/18 to 11/13/18  PT End of Session - 11/13/18 1205    Visit Number  10    Number of Visits  18    Date for PT Re-Evaluation  11/22/18   date extended due to missed week   Authorization Type  Medicare; [employer] 100% coverage    Authorization Time Period  09/14/18 to 12/13/2018; 10/23/18 to 01/21/2019    PT Start Time  1155    PT Stop Time  1236    PT Time Calculation (min)  41 min    Activity Tolerance  Patient tolerated treatment well    Behavior During Therapy  [facility] for tasks assessed/performed       Past Medical History:  Diagnosis Date  . CKD (chronic kidney disease)    "moderate"  . Hyperlipidemia   . Hypertension   . Spinal stenosis     Past Surgical History:  Procedure Laterality Date  . BREAST BIOPSY Left 1977   benign  . CATARACT EXTRACTION, BILATERAL Bilateral 2014  . CHOLECYSTECTOMY, LAPAROSCOPIC  2001  . TONSILLECTOMY    . TOTAL VAGINAL HYSTERECTOMY  1981   secondary to uterine prolapse    There were no vitals filed for this visit.  Subjective Assessment - 11/13/18 1159    Subjective  Didn't do as many exervcises as she had been doing prior to the holidays.     Pertinent History  CKD, HTN, spinal stenosis,  neurogenic claudication    Patient Stated Goals  improve my balance and walking    Currently in Pain?  No/denies                       [facility] Adult PT Treatment/Exercise - 11/13/18 1202      Ambulation/Gait   Ambulation/Gait Assistance  5: Supervision    Ambulation/Gait Assistance Details  vc for UE swing with pt  demonstrating improved coordination with RUE swing; incorporated cognitive tasks with pt's velocity slowing and RUE stops swinging    Ambulation Distance (Feet)  800 Feet    Gait Pattern  Step-through pattern;Decreased arm swing - right;Decreased arm swing - left;Decreased stride length;Narrow base of support;Decreased trunk rotation      Lumbar Exercises: Aerobic   Nustep  Nustep x 10.5 min with intervals of 30 sec >80 spm, then 90 seconds >60 spm        PWR [facility]) - 11/13/18 1242    PWR! exercises  Moves in standing    PWR! Up  20    PWR! Rock  10    PWR! Twist  10    PWR Step  20    Comments  imbalance with PWR step and no UE support; returned to one hand on counter with better techniqu       Balance Exercises - 11/13/18 1247      Balance Exercises: Standing   Standing Eyes Closed  Narrow base of support (BOS);Head turns;Foam/compliant surface    SLS  Eyes open;Foam/compliant surface;Intermittent upper extremity support    Stepping Strategy  Anterior;Lateral;Foam/compliant surface;UE support;10 reps    Retro Gait  Foam/compliant surface  Sidestepping  Foam/compliant support    Step Over Hurdles / Cones  single and double stacked black beams; fwd/bkwd/side    Marching Limitations  slow march on blue mat, intermittent UE support          PT Short Term Goals - 10/23/18 1232      PT SHORT TERM GOAL #1   Title  Patient will be independent with updated HEP (Target all STGs 11/22/2018)    Time  4    Period  Weeks    Status  New    Target Date  11/22/18      PT SHORT TERM GOAL #2   Title  Patient will improve TUG vs TUG cog difference to <=25% (last measured 38% difference) with pt counting backwards by 1's (could not do by 3's)    Time  4    Period  Weeks    Status  New        PT Long Term Goals - 10/23/18 1236      PT LONG TERM GOAL #1   Title  Patient will be independent with advanced HEP including knowledge of appropriate use of home stationary bike (TARGET  all LTGs 12/22/2018)    Time  8    Period  Weeks    Status  New    Target Date  12/22/18      PT LONG TERM GOAL #2   Title  Patient will perform 15 minutes on recumbent stepper (to simulate use of her stationary bicycle at home) with appropriate intensity as measured by steps/minute.     Time  8    Period  Weeks    Status  New      PT LONG TERM GOAL #3   Title  Patient will improve mini-BEST score to >=24/28 demonstrating improved balance and reduced fall risk.    Time  8    Period  Weeks    Status  New            Plan - 11/13/18 1301    Clinical Impression Statement  Session focused on instruction in interval training using seated stepper (pt plans to use her stationary bike at home). Again revisited community-based Parkinson's exercise classes (cycling or PWR would be great for pt), however she is not interested in group setting. Also focused on gait training and incorporating cognitive tasks, which significantly impacted the quality of her gait. Balance training focusing on compliant surfaces and balance recovery. Patient should be ready for discharge at end of January (at time of STGs).     Rehab Potential  Good    PT Frequency  Other (comment)   2x/wk x 1 week; followed by 1 x week for 7 weeks   PT Duration  8 weeks    PT Treatment/Interventions  ADLs/Self Care Home Management;Gait training;DME Instruction;Functional mobility training;Therapeutic activities;Therapeutic exercise;Balance training;Neuromuscular re-education;Manual techniques;Patient/family education;Passive range of motion;Vestibular    PT Next Visit Plan   monitor BP prior to exercise and after Nustep; use Nustep to warm-up and progress to 15 minutes with intervals for LTG and pratice incr intensity/intervals (>80 spm) try 30 sec intervals to make it easier for pt to keep track of schedule; practice standing PWR with initial reps for slow,controlled stretching and then with intensity; gait with cognitive challenges,  assisted UE swing    Consulted and Agree with Plan of Care  Patient       Patient will benefit from skilled therapeutic intervention in order to improve the following  deficits and impairments:  Abnormal gait, Decreased balance, Decreased knowledge of use of DME, Decreased coordination, Decreased range of motion, Decreased strength, Dizziness, Impaired flexibility, Postural dysfunction  Visit Diagnosis: Other symptoms and signs involving the nervous system  Muscle weakness (generalized)     Problem List Patient Active Problem List   Diagnosis Date Noted  . Vitamin D deficiency 12/17/2014  . Atrophic vaginitis 12/17/2014  . HTN (hypertension) 09/10/2013  . Hypercholesterolemia 09/10/2013  . Postmenopausal HRT (hormone replacement therapy) 09/10/2013    Rexanne Mano, PT 11/13/2018, 1:09 PM  [facility] [address] [place], [place], [address] Phone: [phone]   Fax:  [phone]  Name: Tracy Bernard MRN: [id number] Date of Birth: [birth year]

## 2018-11-20 ENCOUNTER — Ambulatory Visit: Payer: Medicare Other | Admitting: Physical Therapy

## 2018-11-20 ENCOUNTER — Encounter: Payer: Self-pay | Admitting: Physical Therapy

## 2018-11-20 DIAGNOSIS — M6281 Muscle weakness (generalized): Secondary | ICD-10-CM

## 2018-11-20 DIAGNOSIS — R29818 Other symptoms and signs involving the nervous system: Secondary | ICD-10-CM | POA: Diagnosis not present

## 2018-11-20 DIAGNOSIS — R2681 Unsteadiness on feet: Secondary | ICD-10-CM | POA: Diagnosis not present

## 2018-11-20 NOTE — Therapy (Signed)
[facility] [address] [place], [place], [address] Phone: [phone]   Fax:  [phone]  Physical Therapy Treatment  Patient Details  Name: Tracy Bernard MRN: [id number] Date of Birth: 1931-01-07 Referring Provider (PT): Penni Bombard, MD   Encounter Date: 11/20/2018  PT End of Session - 11/20/18 1144    Visit Number  11    Number of Visits  18    Date for PT Re-Evaluation  12/04/18    Authorization Type  Medicare; [employer] 100% coverage    Authorization Time Period  09/14/18 to 12/13/2018; 10/23/18 to 01/21/2019    PT Start Time  1145    PT Stop Time  1226    PT Time Calculation (min)  41 min    Activity Tolerance  Patient tolerated treatment well    Behavior During Therapy  [facility] for tasks assessed/performed       Past Medical History:  Diagnosis Date  . CKD (chronic kidney disease)    "moderate"  . Hyperlipidemia   . Hypertension   . Spinal stenosis     Past Surgical History:  Procedure Laterality Date  . BREAST BIOPSY Left 1977   benign  . CATARACT EXTRACTION, BILATERAL Bilateral 2014  . CHOLECYSTECTOMY, LAPAROSCOPIC  2001  . TONSILLECTOMY    . TOTAL VAGINAL HYSTERECTOMY  1981   secondary to uterine prolapse    There were no vitals filed for this visit.  Subjective Assessment - 11/20/18 1146    Subjective  Doing better with doing exercises every day and even got on her bike. Doing 10 minutes and doing intervals.     Pertinent History  CKD, HTN, spinal stenosis,  neurogenic claudication    Patient Stated Goals  improve my balance and walking    Currently in Pain?  No/denies         [facility] PT Assessment - 11/20/18 0001      Standardized Balance Assessment   Standardized Balance Assessment  Timed Up and Go Test      Timed Up and Go Test   Normal TUG (seconds)  8.78    Cognitive TUG (seconds)  8.94    TUG Comments  2% difference                   [facility] Adult PT  Treatment/Exercise - 11/20/18 1151      Ambulation/Gait   Ambulation/Gait  Yes    Ambulation/Gait Assistance  5: Supervision;4: Min guard    Ambulation/Gait Assistance Details  varied speeds with incr speed eliciting incr RUE swing, however pt cannot maintain when returns to comfortable pace; very slow speeds to exaggerate UE swing causes imbalance    Ambulation Distance (Feet)  500 Feet    Assistive device  None    Gait Pattern  Step-through pattern;Decreased arm swing - right;Narrow base of support;Decreased trunk rotation    Ambulation Surface  Level;Indoor      Posture/Postural Control   Posture/Postural Control  Postural limitations    Postural Limitations  Forward head    Posture Comments  vc during PWR exercises for chin tuck                        Lumbar Exercises: Aerobic   Other Aerobic Exercise  Sci-Fit stepper L2.0 x 4 minutes warm-up       PWR standing PWR [facility]) - 11/20/18 1308    PWR! Up  20    PWR! [employer]  20    PWR! Twist  20    PWR Step  20    Basic 4 Flow  5    Comments  initial 10 reps slow; last 10 with intensity            PT Short Term Goals - 11/20/18 1313      PT SHORT TERM GOAL #1   Title  Patient will be independent with updated HEP (Target all STGs 11/22/2018)    Baseline  PWR supine, PWR sit, PWR stand and home stationary cycle    Time  4    Period  Weeks    Status  Achieved      PT SHORT TERM GOAL #2   Title  Patient will improve TUG vs TUG cog difference to <=25% (last measured 38% difference) with pt counting backwards by 1's (could not do by 3's)    Baseline  11/20/18 2% difference (counting back by 1's as done previously)    Time  4    Period  Weeks    Status  Achieved        PT Long Term Goals - 10/23/18 1236      PT LONG TERM GOAL #1   Title  Patient will be independent with advanced HEP including knowledge of appropriate use of home stationary bike (TARGET all LTGs 12/22/2018)    Time  8    Period  Weeks    Status   New    Target Date  12/22/18      PT LONG TERM GOAL #2   Title  Patient will perform 15 minutes on recumbent stepper (to simulate use of her stationary bicycle at home) with appropriate intensity as measured by steps/minute.     Time  8    Period  Weeks    Status  New      PT LONG TERM GOAL #3   Title  Patient will improve mini-BEST score to >=24/28 demonstrating improved balance and reduced fall risk.    Time  8    Period  Weeks    Status  New            Plan - 11/20/18 1316    Clinical Impression Statement  STGs assessed with pt meeting 2 of 2 goals. Session then focused on PWR exercises with varying intensity/speed of movements, using FLOW for incr cognitive challenge, and gait training. Patient demonstrated significant improvement in TUG and TUG cognitive scores. Continue incorporating cognitive challenges and instruct in FLOW for supine and sitting PWR. Plan is to discharge 12/04/18 and have pt return in 6 months for PD screens with PT/OT/SLP.     Rehab Potential  Good    PT Frequency  Other (comment)   2x/wk x 1 week; followed by 1 x week for 7 weeks   PT Duration  8 weeks    PT Treatment/Interventions  ADLs/Self Care Home Management;Gait training;DME Instruction;Functional mobility training;Therapeutic activities;Therapeutic exercise;Balance training;Neuromuscular re-education;Manual techniques;Patient/family education;Passive range of motion;Vestibular    PT Next Visit Plan  use Nustep to warm-up and progress to 15 minutes with intervals for LTG and pratice incr intensity/intervals (>80 spm) try 30 sec intervals to make it easier for pt to keep track of schedule; practice PWR FLOW with supine &/or sitting; gait with cognitive challenges, assisted UE swing    Consulted and Agree with Plan of Care  Patient       Patient will benefit from skilled therapeutic intervention in order to improve the following deficits  and impairments:  Abnormal gait, Decreased balance, Decreased  knowledge of use of DME, Decreased coordination, Decreased range of motion, Decreased strength, Dizziness, Impaired flexibility, Postural dysfunction  Visit Diagnosis: Other symptoms and signs involving the nervous system  Muscle weakness (generalized)     Problem List Patient Active Problem List   Diagnosis Date Noted  . Vitamin D deficiency 12/17/2014  . Atrophic vaginitis 12/17/2014  . HTN (hypertension) 09/10/2013  . Hypercholesterolemia 09/10/2013  . Postmenopausal HRT (hormone replacement therapy) 09/10/2013    Rexanne Mano , PT 11/20/2018, 1:25 PM  [facility] [address] [place], [place], [address] Phone: [phone]   Fax:  [phone]  Name: Tracy Bernard MRN: [id number] Date of Birth: 12-20-1930

## 2018-11-27 ENCOUNTER — Encounter: Payer: Self-pay | Admitting: Physical Therapy

## 2018-11-27 ENCOUNTER — Ambulatory Visit: Payer: Medicare Other | Admitting: Physical Therapy

## 2018-11-27 VITALS — BP 124/72

## 2018-11-27 DIAGNOSIS — M6281 Muscle weakness (generalized): Secondary | ICD-10-CM | POA: Diagnosis not present

## 2018-11-27 DIAGNOSIS — R29818 Other symptoms and signs involving the nervous system: Secondary | ICD-10-CM

## 2018-11-27 DIAGNOSIS — R2681 Unsteadiness on feet: Secondary | ICD-10-CM | POA: Diagnosis not present

## 2018-11-27 NOTE — Therapy (Signed)
[facility] [address] [place], [place], [address] Phone: [phone]   Fax:  [phone]  Physical Therapy Treatment  Patient Details  Name: Tracy Bernard MRN: [id number] Date of Birth: 07-21-1931 Referring Provider (PT): Penni Bombard, MD   Encounter Date: 11/27/2018  PT End of Session - 11/27/18 1150    Visit Number  12    Number of Visits  18    Date for PT Re-Evaluation  12/04/18    Authorization Type  Medicare; [employer] 100% coverage    Authorization Time Period  09/14/18 to 12/13/2018; 10/23/18 to 01/21/2019    PT Start Time  1149    PT Stop Time  1234    PT Time Calculation (min)  45 min    Activity Tolerance  Patient tolerated treatment well    Behavior During Therapy  [facility] for tasks assessed/performed       Past Medical History:  Diagnosis Date  . CKD (chronic kidney disease)    "moderate"  . Hyperlipidemia   . Hypertension   . Spinal stenosis     Past Surgical History:  Procedure Laterality Date  . BREAST BIOPSY Left 1977   benign  . CATARACT EXTRACTION, BILATERAL Bilateral 2014  . CHOLECYSTECTOMY, LAPAROSCOPIC  2001  . TONSILLECTOMY    . TOTAL VAGINAL HYSTERECTOMY  1981   secondary to uterine prolapse    Vitals:   11/27/18 1151 11/27/18 1204  BP: 112/62 124/72    Subjective Assessment - 11/27/18 1151    Subjective  Feeling a bit dizzy today. BP was low (116/53) at home. Sometimes it is low. She had a coke before she came    Pertinent History  CKD, HTN, spinal stenosis,  neurogenic claudication    Patient Stated Goals  improve my balance and walking    Currently in Pain?  No/denies                       [facility] Adult PT Treatment/Exercise - 11/27/18 1157      Ambulation/Gait   Ambulation/Gait Assistance  5: Supervision;4: Min guard    Ambulation/Gait Assistance Details  varied speeds with working on RUE swing (including use of walking poles with PT holding other end and  assisting her UE swing    Ambulation Distance (Feet)  500 Feet    Assistive device  None    Gait Pattern  Step-through pattern;Decreased arm swing - right;Decreased trunk rotation    Pre-Gait Activities  one hand on counter, step forward with one leg and opposite arm, repeating and focusing on upright posture; x 15 each side      Lumbar Exercises: Aerobic   Nustep  L4 x min all 4 extremities; fast intervals for 30 sec (>80 spm) with 60 seconds moderate rate between        PWR [facility]) - 11/27/18 1403    PWR! exercises  Moves in standing    Basic 4 Flow  15    Comments  required min cues for technique initially; then continued to require min cues for sequencing (despite use of pictures placed in front of her) throughout all 15 reps          PT Education - 11/27/18 1715    Education Details  pt requested repeat handout for community exercise classes; not sure if she will try one but wants the resource list; educated there is a fee for the PWR! classes and will have to get back to  her on the amount    Person(s) Educated  Patient    Methods  Explanation;Handout    Comprehension  Verbalized understanding       PT Short Term Goals - 11/20/18 1313      PT SHORT TERM GOAL #1   Title  Patient will be independent with updated HEP (Target all STGs 11/22/2018)    Baseline  PWR supine, PWR sit, PWR stand and home stationary cycle    Time  4    Period  Weeks    Status  Achieved      PT SHORT TERM GOAL #2   Title  Patient will improve TUG vs TUG cog difference to <=25% (last measured 38% difference) with pt counting backwards by 1's (could not do by 3's)    Baseline  11/20/18 2% difference (counting back by 1's as done previously)    Time  4    Period  Weeks    Status  Achieved        PT Long Term Goals - 10/23/18 1236      PT LONG TERM GOAL #1   Title  Patient will be independent with advanced HEP including knowledge of appropriate use of home stationary bike (TARGET all LTGs  12/22/2018)    Time  8    Period  Weeks    Status  New    Target Date  12/22/18      PT LONG TERM GOAL #2   Title  Patient will perform 15 minutes on recumbent stepper (to simulate use of her stationary bicycle at home) with appropriate intensity as measured by steps/minute.     Time  8    Period  Weeks    Status  New      PT LONG TERM GOAL #3   Title  Patient will improve mini-BEST score to >=24/28 demonstrating improved balance and reduced fall risk.    Time  8    Period  Weeks    Status  New            Plan - 11/27/18 1722    Clinical Impression Statement  Session focused on incorporating cognitive challenges into her exercises while continuing to improve her technique. Gait training for incr step length, incr velocity and especially incr RUE swing. Patient is pleased with her improvements and agrees with discharge after next visit.    Rehab Potential  Good    PT Frequency  Other (comment)   2x/wk x 1 week; followed by 1 x week for 7 weeks   PT Duration  8 weeks    PT Treatment/Interventions  ADLs/Self Care Home Management;Gait training;DME Instruction;Functional mobility training;Therapeutic activities;Therapeutic exercise;Balance training;Neuromuscular re-education;Manual techniques;Patient/family education;Passive range of motion;Vestibular    PT Next Visit Plan  chek LTGs and d/c    Consulted and Agree with Plan of Care  Patient       Patient will benefit from skilled therapeutic intervention in order to improve the following deficits and impairments:  Abnormal gait, Decreased balance, Decreased knowledge of use of DME, Decreased coordination, Decreased range of motion, Decreased strength, Dizziness, Impaired flexibility, Postural dysfunction  Visit Diagnosis: Other symptoms and signs involving the nervous system  Muscle weakness (generalized)  Unsteadiness on feet     Problem List Patient Active Problem List   Diagnosis Date Noted  . Vitamin D deficiency  12/17/2014  . Atrophic vaginitis 12/17/2014  . HTN (hypertension) 09/10/2013  . Hypercholesterolemia 09/10/2013  . Postmenopausal HRT (hormone replacement therapy) 09/10/2013  Rexanne Mano, PT 11/27/2018, 5:29 PM  [facility] [address] [place], [place], [address] Phone: [phone]   Fax:  [phone]  Name: Tracy Bernard MRN: [id number] Date of Birth: Oct 12, 1931

## 2018-12-04 ENCOUNTER — Ambulatory Visit: Payer: Medicare Other | Admitting: Physical Therapy

## 2018-12-04 ENCOUNTER — Encounter: Payer: Self-pay | Admitting: Physical Therapy

## 2018-12-04 DIAGNOSIS — R2681 Unsteadiness on feet: Secondary | ICD-10-CM | POA: Diagnosis not present

## 2018-12-04 DIAGNOSIS — M6281 Muscle weakness (generalized): Secondary | ICD-10-CM | POA: Diagnosis not present

## 2018-12-04 DIAGNOSIS — R29818 Other symptoms and signs involving the nervous system: Secondary | ICD-10-CM | POA: Diagnosis not present

## 2018-12-04 NOTE — Therapy (Signed)
[facility] [address] [place], [place], [address] Phone: [phone]   Fax:  [phone]  Physical Therapy Treatment and Discharge Summary  Patient Details  Name: Tracy Bernard MRN: [id number] Date of Birth: September 27, 1931 Referring Provider (PT): Penni Bombard, MD   Encounter Date: 12/04/2018  PT End of Session - 12/04/18 1153    Visit Number  13    Number of Visits  18    Date for PT Re-Evaluation  12/04/18    Authorization Type  Medicare; [employer] 100% coverage    Authorization Time Period  09/14/18 to 12/13/2018; 10/23/18 to 01/21/2019    PT Start Time  1151    PT Stop Time  1226    PT Time Calculation (min)  35 min    Activity Tolerance  Patient tolerated treatment well    Behavior During Therapy  [facility] for tasks assessed/performed       Past Medical History:  Diagnosis Date  . CKD (chronic kidney disease)    "moderate"  . Hyperlipidemia   . Hypertension   . Spinal stenosis     Past Surgical History:  Procedure Laterality Date  . BREAST BIOPSY Left 1977   benign  . CATARACT EXTRACTION, BILATERAL Bilateral 2014  . CHOLECYSTECTOMY, LAPAROSCOPIC  2001  . TONSILLECTOMY    . TOTAL VAGINAL HYSTERECTOMY  1981   secondary to uterine prolapse    There were no vitals filed for this visit.  Subjective Assessment - 12/04/18 1153    Subjective  Reports she may look into the PWR! classes and definitely wants to return in 6 months for PD screens by PT, OT, SLP.     Pertinent History  CKD, HTN, spinal stenosis,  neurogenic claudication    Patient Stated Goals  improve my balance and walking    Currently in Pain?  No/denies         Mini-BESTest: [employer]  2005-2013 [employer]. All rights reserved. ________________________________________________________________________________________Anticipatory_________Subscore___5__/6 1. SIT TO STAND Instruction: "Cross your  arms across your chest. Try not to use your hands unless you must.Do not let your legs lean against the back of the chair when you stand. Please stand up now." x(2) Normal: Comes to stand without use of hands and stabilizes independently. (1) Moderate: Comes to stand WITH use of hands on first attempt. (0) Severe: Unable to stand up from chair without assistance, OR needs several attempts with use of hands. 2. RISE TO TOES Instruction: "Place your feet shoulder width apart. Place your hands on your hips. Try to rise as high as you can onto your toes. I will count out loud to 3 seconds. Try to hold this pose for at least 3 seconds. Look straight ahead. Rise now." x(2) Normal: Stable for 3 s with maximum height. (1) Moderate: Heels up, but not full range (smaller than when holding hands), OR noticeable instability for 3 s. (0) Severe: < 3 s. 3. STAND ON ONE LEG Instruction: "Look straight ahead. Keep your hands on your hips. Lift your leg off of the ground behind you without touching or resting your raised leg upon your other standing leg. Stay standing on one leg as long as you can. Look straight ahead. Lift now." Left: Time in Seconds Trial 1:__2.34___Trial 2:__3.81___ (2) Normal: 20 s. x(1) Moderate: < 20 s. (0) Severe: Unable. Right: Time in Seconds Trial 1:__7.1___Trial 2:_6.75____ (2) Normal: 20 s. x(1) Moderate: < 20 s. (0) Severe: Unable To score  each side separately use the trial with the longest time. To calculate the sub-score and total score use the side [left or right] with the lowest numerical score [i.e. the worse side]. ______________________________________________________________________________________Reactive Postural Control___________Subscore:___4__/6 4. COMPENSATORY STEPPING CORRECTION- [id number] Instruction: "Stand with your feet shoulder width apart, arms at your sides. Lean forward against my hands beyond your forward limits. When I let go, do whatever is necessary,  including taking a step, to avoid a fall." (2) Normal: Recovers independently with a single, large step (second realignment step is allowed). x(1) Moderate: More than one step used to recover equilibrium. (0) Severe: No step, OR would fall if not caught, OR falls spontaneously. 5. COMPENSATORY STEPPING CORRECTION- BACKWARD Instruction: "Stand with your feet shoulder width apart, arms at your sides. Lean backward against my hands beyond your backward limits. When I let go, do whatever is necessary, including taking a step, to avoid a fall." (2) Normal: Recovers independently with a single, large step. x(1) Moderate: More than one step used to recover equilibrium. (0) Severe: No step, OR would fall if not caught, OR falls spontaneously. 6. COMPENSATORY STEPPING CORRECTION- LATERAL Instruction: "Stand with your feet together, arms down at your sides. Lean into my hand beyond your sideways limit. When I let go, do whatever is necessary, including taking a step, to avoid a fall." Left x(2) Normal: Recovers independently with 1 step (crossover or lateral OK). (1) Moderate: Several steps to recover equilibrium. (0) Severe: Falls, or cannot step. Right x(2) Normal: Recovers independently with 1 step (crossover or lateral OK). (1) Moderate: Several steps to recover equilibrium. (0) Severe: Falls, or cannot step. Use the side with the lowest score to calculate sub-score and total score. ____________________________________________________________________________________Sensory Orientation_____________Subscore:____6_____/6 7. STANCE (FEET TOGETHER); EYES OPEN, FIRM SURFACE Instruction: "Place your hands on your hips. Place your feet together until almost touching. Look straight ahead. Be as stable and still as possible, until I say stop." Time in seconds:________ x(2) Normal: 30 s. (1) Moderate: < 30 s. (0) Severe: Unable. 8. STANCE (FEET TOGETHER); EYES CLOSED, FOAM SURFACE Instruction: "Step  onto the foam. Place your hands on your hips. Place your feet together until almost touching. Be as stable and still as possible, until I say stop. I will start timing when you close your eyes." Time in seconds:________ x(2) Normal: 30 s. (1) Moderate: < 30 s. (0) Severe: Unable. 9. INCLINE- EYES CLOSED Instruction: "Step onto the incline ramp. Please stand on the incline ramp with your toes toward the top. Place your feet shoulder width apart and have your arms down at your sides. I will start timing when you close your eyes." Time in seconds:________ x(2) Normal: Stands independently 30 s and aligns with gravity. (1) Moderate: Stands independently <30 s OR aligns with surface. (0) Severe: Unable. _________________________________________________________________________________________Dynamic Gait ______Subscore_____9___/10 10. CHANGE IN GAIT SPEED Instruction: "Begin walking at your normal speed, when I tell you 'fast', walk as fast as you can. When I say 'slow', walk very slowly." x(2) Normal: Significantly changes walking speed without imbalance. (1) Moderate: Unable to change walking speed or signs of imbalance. (0) Severe: Unable to achieve significant change in walking speed AND signs of imbalance. [place] - HORIZONTAL Instruction: "Begin walking at your normal speed, when I say "right", turn your head and look to the right. When I say "left" turn your head and look to the left. Try to keep yourself walking in a straight line." x(2) Normal: performs head turns with no change in  gait speed and good balance. (1) Moderate: performs head turns with reduction in gait speed. (0) Severe: performs head turns with imbalance. 12. WALK WITH PIVOT TURNS Instruction: "Begin walking at your normal speed. When I tell you to 'turn and stop', turn as quickly as you can, face the opposite direction, and stop. After the turn, your feet should be close together." x(2) Normal: Turns  with feet close FAST (< 3 steps) with good balance. (1) Moderate: Turns with feet close SLOW (>4 steps) with good balance. (0) Severe: Cannot turn with feet close at any speed without imbalance. 13. STEP OVER OBSTACLES Instruction: "Begin walking at your normal speed. When you get to the box, step over it, not around it and keep walking." x(2) Normal: Able to step over box with minimal change of gait speed and with good balance. (1) Moderate: Steps over box but touches box OR displays cautious behavior by slowing gait. (0) Severe: Unable to step over box OR steps around box. 14. TIMED UP & GO WITH DUAL TASK [3 METER WALK] Instruction TUG: "When I say 'Go', stand up from chair, walk at your normal speed across the tape on the floor, turn around, and come back to sit in the chair." Instruction TUG with Dual Task: "Count backwards by threes starting at ___. When I say 'Go', stand up from chair, walk at your normal speed across the tape on the floor, turn around, and come back to sit in the chair. Continue counting backwards the entire time." TUG: ___8.25_____seconds; Dual Task TUG: ___10.56_____seconds (2) Normal: No noticeable change in sitting, standing or walking while backward counting when compared to TUG without Dual Task. x(1) Moderate: Dual Task affects either counting OR walking (>10%) when compared to the TUG without Dual Task. (0) Severe: Stops counting while walking OR stops walking while counting. When scoring item 14, if subject's gait speed slows more than 10% between the TUG without and with a Dual Task the score should be decreased by a point. TOTAL SCORE: ____24____/28                [facility] Adult PT Treatment/Exercise - 12/04/18 1232      Ambulation/Gait   Ambulation/Gait Assistance  5: Supervision    Ambulation/Gait Assistance Details  varied speeds, turns, vc for UE swing (which pt has difficulty with once she starts thinking about it)    Ambulation Distance  (Feet)  360 Feet    Assistive device  None    Gait Pattern  Step-through pattern;Decreased arm swing - right;Decreased trunk rotation    Ambulation Surface  Level;Indoor      Lumbar Exercises: Aerobic   Nustep  L3 x 8 minutes, varying intensity (50 spm to 80 spm)             PT Education - 12/04/18 1235    Education Details  provided pt with specific information and cost for PWR! classes; encouraged her to go and observe and/or call instructor to discuss;     Person(s) Educated  Patient    Methods  Explanation;Handout    Comprehension  Verbalized understanding       PT Short Term Goals - 11/20/18 1313      PT SHORT TERM GOAL #1   Title  Patient will be independent with updated HEP (Target all STGs 11/22/2018)    Baseline  PWR supine, PWR sit, PWR stand and home stationary cycle    Time  4    Period  Weeks  Status  Achieved      PT SHORT TERM GOAL #2   Title  Patient will improve TUG vs TUG cog difference to <=25% (last measured 38% difference) with pt counting backwards by 1's (could not do by 3's)    Baseline  11/20/18 2% difference (counting back by 1's as done previously)    Time  4    Period  Weeks    Status  Achieved        PT Long Term Goals - 12/04/18 1230      PT LONG TERM GOAL #1   Title  Patient will be independent with advanced HEP including knowledge of appropriate use of home stationary bike (TARGET all LTGs 12/22/2018)    Time  8    Period  Weeks    Status  Achieved      PT LONG TERM GOAL #2   Title  Patient will perform 15 minutes on recumbent stepper (to simulate use of her stationary bicycle at home) with appropriate intensity as measured by steps/minute.     Baseline  12/04/18 met x8 minutes (pt did not want to do full 15 minutes as going straight to lunch with friend--in dress clothes)    Time  8    Period  Weeks    Status  Partially Met      PT LONG TERM GOAL #3   Title  Patient will improve mini-BEST score to >=24/28 demonstrating improved  balance and reduced fall risk.    Baseline  12/04/18  24/28    Time  8    Period  Weeks    Status  Achieved            Plan - 12/04/18 1911    Clinical Impression Statement  Patient made excellent progress overall during this episode of PT. She is being discharged several weeks early due to this progress. She has met 2 of 3 LTGs and partially met the 3rd goal. She has been educated on the importance of continued exercise and reports she plans to use her stationary bike for her aerobic exercise. Patient is please with her progress and agrees with discharge.     Rehab Potential  Good    PT Frequency  Other (comment)   2x/wk x 1 week; followed by 1 x week for 7 weeks   PT Duration  8 weeks    PT Treatment/Interventions  ADLs/Self Care Home Management;Gait training;DME Instruction;Functional mobility training;Therapeutic activities;Therapeutic exercise;Balance training;Neuromuscular re-education;Manual techniques;Patient/family education;Passive range of motion;Vestibular    Consulted and Agree with Plan of Care  Patient       Patient will benefit from skilled therapeutic intervention in order to improve the following deficits and impairments:  Abnormal gait, Decreased balance, Decreased knowledge of use of DME, Decreased coordination, Decreased range of motion, Decreased strength, Dizziness, Impaired flexibility, Postural dysfunction  Visit Diagnosis: Other symptoms and signs involving the nervous system  Unsteadiness on feet     Problem List Patient Active Problem List   Diagnosis Date Noted  . Vitamin D deficiency 12/17/2014  . Atrophic vaginitis 12/17/2014  . HTN (hypertension) 09/10/2013  . Hypercholesterolemia 09/10/2013  . Postmenopausal HRT (hormone replacement therapy) 09/10/2013   PHYSICAL THERAPY DISCHARGE SUMMARY  Visits from Start of Care: 13  Current functional level related to goals / functional outcomes: See LTGs above   Remaining deficits: Gait deficits  related to Parkinson's   Education / Equipment: HEP; available PD community resources including exercise classes  Plan: Patient agrees to  discharge.  Patient goals were partially met. Patient is being discharged due to being pleased with the current functional level.  ?????      Rexanne Mano, PT 12/04/2018, 7:16 PM  [facility] [address] [place], [place], [address] Phone: [phone]   Fax:  [phone]  Name: Tracy Bernard MRN: [id number] Date of Birth: December 13, 1930

## 2018-12-25 ENCOUNTER — Encounter: Payer: Self-pay | Admitting: Diagnostic Neuroimaging

## 2018-12-25 ENCOUNTER — Ambulatory Visit (INDEPENDENT_AMBULATORY_CARE_PROVIDER_SITE_OTHER): Payer: Medicare Other | Admitting: Diagnostic Neuroimaging

## 2018-12-25 VITALS — BP 141/76 | HR 71 | Ht 62.0 in | Wt 118.4 lb

## 2018-12-25 DIAGNOSIS — M48062 Spinal stenosis, lumbar region with neurogenic claudication: Secondary | ICD-10-CM | POA: Diagnosis not present

## 2018-12-25 DIAGNOSIS — G20 Parkinson's disease: Secondary | ICD-10-CM | POA: Diagnosis not present

## 2018-12-25 MED ORDER — CARBIDOPA-LEVODOPA 25-100 MG PO TABS: 1.0000 | ORAL_TABLET | Freq: Three times a day (TID) | ORAL | 4 refills | Status: DC

## 2018-12-25 NOTE — Progress Notes (Signed)
[facility]  PATIENT: Tracy Bernard DOB: 05-21-1931  REFERRING CLINICIAN: Amedeo Gory HISTORY FROM: patient  REASON FOR VISIT: follow up   HISTORICAL  CHIEF COMPLAINT:  Chief Complaint  Patient presents with  . Follow-up    Rm 7, alone  . Parkinson's Disease    stable, has dizzy spells in am, when wears off.    HISTORY OF PRESENT ILLNESS:   UPDATE (12/25/18, VRP): Since last visit, doing well. Symptoms are improved on carb/levo (tremor). BP continues to fluctuate. No alleviating or aggravating factors. Tolerating meds.    PRIOR HPI (08/21/28): 85 year old female here for evaluation of balance and tremor problems.  Patient has had progressive resting and postural tremor in the right upper and right lower extremities since 2016.  She has also had balance and gait difficulty in last 6 months, and has had several falls.  Patient lives alone.  Symptoms have been noticed by patient as well as her daughter.  Daughter has also noticed that patient's voice has become "soft".  Patient denies any swallowing problems or sialorrhea.  Patient has had lack of sense of smell for past 20 years.  She has mild constipation.  She has intermittent wild dreams.  She is just had problems with sleepwalking.  She has some insomnia issues.  Patient was diagnosed with possible essential tremor initially by PCP, but due to progression and change of symptoms consideration of Parkinson's disease was made.  Therefore patient was referred here for further evaluation.  No family history of tremor or Parkinson's disease.   REVIEW OF SYSTEMS: Full 14 system review of systems performed and negative with exception of: Dizziness tremors insomnia frequent urination blood pressure fluctuation.   ALLERGIES: Allergies  Allergen Reactions  . Lisinopril Other (See Comments)    cough  . Tramadol Other (See Comments)    Sleepiness, confusion    HOME MEDICATIONS: Outpatient Medications Prior to  Visit  Medication Sig Dispense Refill  . BIOTIN 5000 PO Take by mouth.    . brimonidine (ALPHAGAN) 0.2 % ophthalmic solution     . Calcium Citrate-Vitamin D (CALCIUM CITRATE + D3) 200-250 MG-UNIT TABS Take 1 tablet by mouth daily.    . carbidopa-levodopa (SINEMET IR) 25-100 MG tablet Take 1 tablet by mouth 3 (three) times daily before meals. 90 tablet 12  . Cholecalciferol (VITAMIN D PO) Take 1,000 Int'l Units by mouth daily.    . fexofenadine (ALLEGRA) 180 MG tablet Take 180 mg by mouth daily.    Marland Kitchen FIBER PO Take by mouth.    . fluticasone (FLONASE) 50 MCG/ACT nasal spray Place 2 sprays into the nose daily.    . folic acid (FOLVITE) 803 MCG tablet Take 400 mcg by mouth daily.    Marland Kitchen glucosamine-chondroitin 500-400 MG tablet Take 1 tablet by mouth 3 (three) times daily.    Marland Kitchen latanoprost (XALATAN) 0.005 % ophthalmic solution Place 1 drop into both eyes at bedtime.    Marland Kitchen losartan (COZAAR) 25 MG tablet Take 50 mg by mouth daily.     . Multiple Vitamin (MULTIVITAMIN) tablet Take 1 tablet by mouth daily.    . Omega-3 Fatty Acids (FISH OIL) 1200 MG CAPS Take 1 capsule by mouth daily.    Marland Kitchen omeprazole (PRILOSEC) 20 MG capsule Take 20 mg by mouth daily.    . Probiotic Product (PROBIOTIC DAILY PO) Take by mouth.    . simvastatin (ZOCOR) 20 MG tablet Take 20 mg by mouth every evening.    . vitamin B-12 (  CYANOCOBALAMIN) 1000 MCG tablet Take 1,000 mcg by mouth daily.    . vitamin C (ASCORBIC ACID) 500 MG tablet Take 500 mg by mouth daily.    . vitamin E 400 UNIT capsule Take 400 Units by mouth daily.    Marland Kitchen aspirin EC 81 MG tablet Take 81 mg by mouth daily.     No facility-administered medications prior to visit.     PAST MEDICAL HISTORY: Past Medical History:  Diagnosis Date  . CKD (chronic kidney disease)    "moderate"  . Hyperlipidemia   . Hypertension   . Spinal stenosis     PAST SURGICAL HISTORY: Past Surgical History:  Procedure Laterality Date  . BREAST BIOPSY Left 1977   benign  .  CATARACT EXTRACTION, BILATERAL Bilateral 2014  . CHOLECYSTECTOMY, LAPAROSCOPIC  2001  . TONSILLECTOMY    . TOTAL VAGINAL HYSTERECTOMY  1981   secondary to uterine prolapse    FAMILY HISTORY: Family History  Problem Relation Age of Onset  . Breast cancer Sister   . Stroke Sister   . Kidney failure Mother   . Hypertension Mother   . Kidney failure Father   . Hypertension Father   . Colon cancer Daughter 69       resection of colon, had chemo and is now cancer free. at age 45 -2018  . Anuerysm Daughter 2  . Diabetes Daughter 24       treaated with oral med    SOCIAL HISTORY: Social History   Socioeconomic History  . Marital status: Widowed    Spouse name: Not on file  . Number of children: 1  . Years of education: 60  . Highest education level: Not on file  Occupational History  . Not on file  Social Needs  . Financial resource strain: Not on file  . Food insecurity:    Worry: Not on file    Inability: Not on file  . Transportation needs:    Medical: Not on file    Non-medical: Not on file  Tobacco Use  . Smoking status: Never Smoker  . Smokeless tobacco: Never Used  Substance and Sexual Activity  . Alcohol use: Yes    Alcohol/week: 2.0 standard drinks    Types: 2 Standard drinks or equivalent per week    Comment: wine on Friday nights  . Drug use: No  . Sexual activity: Never    Partners: Male    Birth control/protection: Surgical    Comment: hysterectomy  Lifestyle  . Physical activity:    Days per week: Not on file    Minutes per session: Not on file  . Stress: Not on file  Relationships  . Social connections:    Talks on phone: Not on file    Gets together: Not on file    Attends religious service: Not on file    Active member of club or organization: Not on file    Attends meetings of clubs or organizations: Not on file    Relationship status: Not on file  . Intimate partner violence:    Fear of current or ex partner: Not on file    Emotionally  abused: Not on file    Physically abused: Not on file    Forced sexual activity: Not on file  Other Topics Concern  . Not on file  Social History Narrative   Lives alone, daughter lives at [place].  Good neighbor support.  Husband passed at age 20 from heart disease and also was  diabetic.   Caffeine- coffee, 1 cup daily, maybe occas soda     PHYSICAL EXAM   GENERAL EXAM/CONSTITUTIONAL: Vitals:  Vitals:   12/25/18 1320  BP: (!) 141/76  Pulse: 71  Weight: 118 lb 6.4 oz (53.7 kg)  Height: 5\' 2"  (1.575 m)   Body mass index is 21.66 kg/m. Wt Readings from Last 3 Encounters:  12/25/18 118 lb 6.4 oz (53.7 kg)  08/21/18 120 lb 3.2 oz (54.5 kg)  02/25/17 127 lb (57.6 kg)    Patient is in no distress; well developed, nourished and groomed; neck is supple  CARDIOVASCULAR:  Examination of carotid arteries is normal; no carotid bruits  Regular rate and rhythm, no murmurs  Examination of peripheral vascular system by observation and palpation is normal  EYES:  Ophthalmoscopic exam of optic discs and posterior segments is normal; no papilledema or hemorrhages No exam data present  MUSCULOSKELETAL:  Gait, strength, tone, movements noted in Neurologic exam below  NEUROLOGIC: MENTAL STATUS:  No flowsheet data found.  awake, alert, oriented to person, place and time  recent and remote memory intact  normal attention and concentration  language fluent, comprehension intact, naming intact  fund of knowledge appropriate  CRANIAL NERVE:   2nd - no papilledema on fundoscopic exam  2nd, 3rd, 4th, 6th - pupils equal and reactive to light, visual fields full to confrontation, extraocular muscles intact, no nystagmus  5th - facial sensation symmetric  7th - facial strength symmetric  8th - hearing intact  9th - palate elevates symmetrically, uvula midline  11th - shoulder shrug symmetric  12th - tongue protrusion midline  HIPOMIMIA  SOFT, SLIGHTLY HOARSE  VOICE  MOTOR:   MINIMAL RIGHT > LEFT HAND RESTING TREMOR  MINIMAL POSTURAL TREMOR IN BUE  NO MOUTH TREMOR  COGWHEELING AND BRADYKINESIA IN RUE >> LUE  BRADYKINESIA RLE > LLU  full strength in the BUE, BLE  SENSORY:   normal and symmetric to light touch  ABSENT VIB AT ANKLES AND TOES  COORDINATION:   finger-nose-finger, fine finger movements SLOW  REFLEXES:   deep tendon reflexes TRACE and symmetric  GAIT/STATION:   RIGHT ARM SLIGHTLY FLEXED; NO ARM SWING WITH WALKING; LEFT HAND TREMOR WITH WALKING  SLIGHTLY STOOPED POSTURE  SMOOTH STRIDE, SHORT STEPS  SMOOTH TURNING     DIAGNOSTIC DATA (LABS, IMAGING, TESTING) - I reviewed patient records, labs, notes, testing and imaging myself where available.  No results found for: WBC, HGB, HCT, MCV, PLT No results found for: NA, K, CL, CO2, GLUCOSE, BUN, CREATININE, CALCIUM, PROT, ALBUMIN, AST, ALT, ALKPHOS, BILITOT, GFRNONAA, GFRAA No results found for: CHOL, HDL, LDLCALC, LDLDIRECT, TRIG, CHOLHDL No results found for: HGBA1C No results found for: VITAMINB12 No results found for: TSH   07/12/18 MRI lumbar spine [I reviewed images myself. -VRP]  - At L4-5: severe spinal stenosis and severe biforaminal stenosis; anterior spondylolisthesis of L4 on L5 (77mm) - At L3-4: moderate spinal stenosis   07/26/18 NM bone scan 1. Degenerative findings in the lower lumbar spine, shoulders, base of thumbs, knees, and left first MTP joint. 2. Dextroconvex lower thoracic and lumbar scoliosis. 3. Focal of accentuated activity in the right femur greater trochanter is nonspecific but likely reactive at the gluteal insertion site.  09/08/18 MRI brain [I reviewed images myself and agree with interpretation. -VRP]  1.     Mild generalized cortical atrophy that is moderate in the mesial temporal lobes 2.     Mild chronic microvascular ischemic changes in the hemispheres.  3.     Chronic sphenoid sinusitis. 4.     There are no acute  findings.    ASSESSMENT AND PLAN  85 y.o. year old female here with gradual onset progressive resting tremor, cogwheel rigidity, bradykinesia, balance difficulty, hypomania, lack of sense of smell, with signs and symptoms most consistent with idiopathic Parkinson's disease.  Patient also has history of lumbar spinal stenosis with neurogenic claudication, currently being managed conservatively.  Reviewed diagnosis, prognosis and treatment options.  Reviewed community resources with patient.  Dx:  1. Parkinson's disease ([place])   2. Spinal stenosis of lumbar region with neurogenic claudication     PLAN:  PARKINSON'S DISEASE  - continue carb/levo 25/100 1 tab three times a day; take 30 minutes before meals - continue PT exercises for balance (PD and lumbar spinal stenosis)  LUMBAR SPINAL STENOSIS  - conservative mgmt per neurosurgery  Meds ordered this encounter  Medications  . carbidopa-levodopa (SINEMET IR) 25-100 MG tablet    Sig: Take 1 tablet by mouth 3 (three) times daily before meals.    Dispense:  270 tablet    Refill:  4   Return in about 8 months (around 08/25/2019).    Penni Bombard, MD 7/89/3810, 1:75 PM Certified in Neurology, Neurophysiology and Neuroimaging  [facility] [address], [place], [place] [address] [phone]

## 2019-01-02 DIAGNOSIS — H401131 Primary open-angle glaucoma, bilateral, mild stage: Secondary | ICD-10-CM | POA: Diagnosis not present

## 2019-01-08 DIAGNOSIS — I10 Essential (primary) hypertension: Secondary | ICD-10-CM | POA: Diagnosis not present

## 2019-01-11 ENCOUNTER — Encounter (HOSPITAL_COMMUNITY): Payer: Self-pay | Admitting: *Deleted

## 2019-01-11 ENCOUNTER — Emergency Department (HOSPITAL_COMMUNITY)
Admission: EM | Admit: 2019-01-11 | Discharge: 2019-01-11 | Disposition: A | Discharge status: Home / Self Care | Payer: Medicare Other | Attending: Emergency Medicine | Admitting: Emergency Medicine

## 2019-01-11 ENCOUNTER — Other Ambulatory Visit: Payer: Self-pay

## 2019-01-11 ENCOUNTER — Emergency Department (HOSPITAL_COMMUNITY): Payer: Medicare Other

## 2019-01-11 DIAGNOSIS — M25552 Pain in left hip: Secondary | ICD-10-CM | POA: Diagnosis not present

## 2019-01-11 DIAGNOSIS — W19XXXA Unspecified fall, initial encounter: Secondary | ICD-10-CM | POA: Insufficient documentation

## 2019-01-11 DIAGNOSIS — Y939 Activity, unspecified: Secondary | ICD-10-CM | POA: Diagnosis not present

## 2019-01-11 DIAGNOSIS — M533 Sacrococcygeal disorders, not elsewhere classified: Secondary | ICD-10-CM

## 2019-01-11 DIAGNOSIS — M545 Low back pain: Secondary | ICD-10-CM | POA: Diagnosis not present

## 2019-01-11 DIAGNOSIS — M25559 Pain in unspecified hip: Secondary | ICD-10-CM | POA: Diagnosis present

## 2019-01-11 DIAGNOSIS — I129 Hypertensive chronic kidney disease with stage 1 through stage 4 chronic kidney disease, or unspecified chronic kidney disease: Secondary | ICD-10-CM | POA: Diagnosis not present

## 2019-01-11 DIAGNOSIS — S79912A Unspecified injury of left hip, initial encounter: Secondary | ICD-10-CM | POA: Diagnosis not present

## 2019-01-11 DIAGNOSIS — G20 Parkinson's disease: Secondary | ICD-10-CM | POA: Diagnosis not present

## 2019-01-11 DIAGNOSIS — N189 Chronic kidney disease, unspecified: Secondary | ICD-10-CM | POA: Insufficient documentation

## 2019-01-11 DIAGNOSIS — Y929 Unspecified place or not applicable: Secondary | ICD-10-CM | POA: Diagnosis not present

## 2019-01-11 DIAGNOSIS — Z79899 Other long term (current) drug therapy: Secondary | ICD-10-CM | POA: Insufficient documentation

## 2019-01-11 DIAGNOSIS — Y999 Unspecified external cause status: Secondary | ICD-10-CM | POA: Diagnosis not present

## 2019-01-11 MED ORDER — HYDROCODONE-ACETAMINOPHEN 5-325 MG PO TABS: 1.0000 | ORAL_TABLET | Freq: Four times a day (QID) | ORAL | 0 refills | Status: DC | PRN

## 2019-01-11 MED ORDER — HYDROCODONE-ACETAMINOPHEN 5-325 MG PO TABS
1.0000 | ORAL_TABLET | Freq: Once | ORAL | Status: AC
  Administered 2019-01-11: 1 via ORAL
  Filled 2019-01-11: qty 1

## 2019-01-11 MED ORDER — DEXAMETHASONE 4 MG PO TABS
8.0000 mg | ORAL_TABLET | Freq: Once | ORAL | Status: AC
  Administered 2019-01-11: 8 mg via ORAL
  Filled 2019-01-11: qty 2

## 2019-01-11 NOTE — ED Provider Notes (Signed)
[facility] DEPT Provider Note   CSN: [id number] Arrival date & time: 01/11/19  9563    History   Chief Complaint Chief Complaint  Patient presents with  . Hip Pain    HPI Tracy Bernard is a 85 y.o. female.   HPI   85 year old female with left lower back/hip pain.  She had a mechanical fall 4 days ago onto her knees.  She did not hit her head.  No blood thinners.  She has a history of Parkinson's and thinks she lost her balance.  The past day she has been having pain in her left hip/lower back.  Tolerable at rest.  Worse with movement/ambulation.  No numbness or tingling.  Past Medical History:  Diagnosis Date  . CKD (chronic kidney disease)    "moderate"  . Hyperlipidemia   . Hypertension   . Spinal stenosis     Patient Active Problem List   Diagnosis Date Noted  . Vitamin D deficiency 12/17/2014  . Atrophic vaginitis 12/17/2014  . HTN (hypertension) 09/10/2013  . Hypercholesterolemia 09/10/2013  . Postmenopausal HRT (hormone replacement therapy) 09/10/2013    Past Surgical History:  Procedure Laterality Date  . BREAST BIOPSY Left 1977   benign  . CATARACT EXTRACTION, BILATERAL Bilateral 2014  . CHOLECYSTECTOMY, LAPAROSCOPIC  2001  . TONSILLECTOMY    . TOTAL VAGINAL HYSTERECTOMY  1981   secondary to uterine prolapse     OB History    Gravida  1   Para  1   Term  1   Preterm  0   AB  0   Living  1     SAB  0   TAB  0   Ectopic  0   Multiple  0   Live Births  1            Home Medications    Prior to Admission medications   Medication Sig Start Date End Date Taking? Authorizing Provider  BIOTIN 5000 PO Take by mouth.    [provider]  brimonidine (ALPHAGAN) 0.2 % ophthalmic solution  06/20/18   [provider]  Calcium Citrate-Vitamin D (CALCIUM CITRATE + D3) 200-250 MG-UNIT TABS Take 1 tablet by mouth daily.    [provider]  carbidopa-levodopa (SINEMET IR) 25-100 MG  tablet Take 1 tablet by mouth 3 (three) times daily before meals. 12/25/18   Penumalli, Earlean Polka, MD  Cholecalciferol (VITAMIN D PO) Take 1,000 Int'l Units by mouth daily.    [provider]  fexofenadine (ALLEGRA) 180 MG tablet Take 180 mg by mouth daily.    [provider]  FIBER PO Take by mouth.    [provider]  fluticasone (FLONASE) 50 MCG/ACT nasal spray Place 2 sprays into the nose daily.    [provider]  folic acid (FOLVITE) 875 MCG tablet Take 400 mcg by mouth daily.    [provider]  glucosamine-chondroitin 500-400 MG tablet Take 1 tablet by mouth 3 (three) times daily.    [provider]  latanoprost (XALATAN) 0.005 % ophthalmic solution Place 1 drop into both eyes at bedtime.    [provider]  losartan (COZAAR) 25 MG tablet Take 50 mg by mouth daily.     [provider]  Multiple Vitamin (MULTIVITAMIN) tablet Take 1 tablet by mouth daily.    [provider]  Omega-3 Fatty Acids (FISH OIL) 1200 MG CAPS Take 1 capsule by mouth daily.    [provider]  omeprazole (PRILOSEC) 20 MG capsule Take 20 mg by mouth daily.    [provider]  Probiotic Product (PROBIOTIC DAILY PO) Take by mouth.    [provider]  simvastatin (ZOCOR) 20 MG tablet Take 20 mg by mouth every evening.    [provider]  vitamin B-12 (CYANOCOBALAMIN) 1000 MCG tablet Take 1,000 mcg by mouth daily.    [provider]  vitamin C (ASCORBIC ACID) 500 MG tablet Take 500 mg by mouth daily.    [provider]  vitamin E 400 UNIT capsule Take 400 Units by mouth daily.    [provider]    Family History Family History  Problem Relation Age of Onset  . Breast cancer Sister   . Stroke Sister   . Kidney failure Mother   . Hypertension Mother   . Kidney failure Father   . Hypertension Father   . Colon cancer Daughter 103       resection of colon, had chemo and is now  cancer free. at age 76 -2018  . Anuerysm Daughter 2  . Diabetes Daughter 19       treaated with oral med    Social History Social History   Tobacco Use  . Smoking status: Never Smoker  . Smokeless tobacco: Never Used  Substance Use Topics  . Alcohol use: Yes    Alcohol/week: 2.0 standard drinks    Types: 2 Standard drinks or equivalent per week    Comment: wine on Friday nights  . Drug use: No     Allergies   Lisinopril and Tramadol   Review of Systems Review of Systems  All systems reviewed and negative, other than as noted in HPI.  Physical Exam Updated Vital Signs BP 139/70 (BP Location: Left Arm)   Pulse 77   Temp 97.8 F (36.6 C) (Oral)   Resp 18   LMP  (LMP Unknown)   SpO2 100%   Physical Exam Vitals signs and nursing note reviewed.  Constitutional:      General: She is not in acute distress.    Appearance: She is well-developed.  HENT:     Head: Normocephalic and atraumatic.  Eyes:     General:        Right eye: No discharge.        Left eye: No discharge.     Conjunctiva/sclera: Conjunctivae normal.  Neck:     Musculoskeletal: Neck supple.  Cardiovascular:     Rate and Rhythm: Normal rate and regular rhythm.     Heart sounds: Normal heart sounds. No murmur. No friction rub. No gallop.   Pulmonary:     Effort: Pulmonary effort is normal. No respiratory distress.     Breath sounds: Normal breath sounds.  Abdominal:     General: There is no distension.     Palpations: Abdomen is soft.     Tenderness: There is no abdominal tenderness.  Musculoskeletal:        General: No tenderness.     Comments: Tenderness over the left SI joint.  No midline spinal tenderness.  Active range of motion of the left hip without apparent discomfort.  Neurovascular intact.  Skin:    General: Skin is warm and dry.  Neurological:     Mental Status: She is alert.  Psychiatric:        Behavior: Behavior normal.        Thought Content: Thought content normal.       ED Treatments / Results  Labs (all labs ordered are listed, but only abnormal results are displayed) Labs Reviewed - No data to display  EKG None  Radiology No results found.  Procedures Procedures (including critical care time)  Medications Ordered in ED Medications - No data to display   Initial Impression / Assessment and Plan / ED Course  I have reviewed the triage vital signs and the nursing notes.  Pertinent labs & imaging results that were available during my care of the patient were reviewed by me and considered in my medical decision making (see chart for details).   85 year old female with left lower back/left hip pain.  She actually seems to be tender near the left SI joint.  Negative imaging.  Plan symptomatic treatment.  Return cautions were discussed.  Final Clinical Impressions(s) / ED Diagnoses   Final diagnoses:  SI (sacroiliac) pain    ED Discharge Orders    None       Virgel Manifold, MD 01/16/19 [phone]

## 2019-01-11 NOTE — ED Notes (Signed)
Bed: WA25 Expected date:  Expected time:  Means of arrival:  Comments: Triage 2 

## 2019-01-11 NOTE — ED Triage Notes (Signed)
Pt complains of left hip pain since yesterday. Pt reports a fall 4 days ago, pt fell onto her knees. Pt did not lose consciousness, did not hit her head, is not on blood thinners. Pt has hx of parkinson's.

## 2019-01-11 NOTE — ED Notes (Signed)
Pt transported to Xray. 

## 2019-01-15 DIAGNOSIS — M4316 Spondylolisthesis, lumbar region: Secondary | ICD-10-CM | POA: Diagnosis not present

## 2019-01-15 DIAGNOSIS — M5136 Other intervertebral disc degeneration, lumbar region: Secondary | ICD-10-CM | POA: Diagnosis not present

## 2019-01-15 DIAGNOSIS — M5416 Radiculopathy, lumbar region: Secondary | ICD-10-CM | POA: Diagnosis not present

## 2019-01-17 DIAGNOSIS — M5416 Radiculopathy, lumbar region: Secondary | ICD-10-CM | POA: Diagnosis not present

## 2019-01-17 DIAGNOSIS — M4316 Spondylolisthesis, lumbar region: Secondary | ICD-10-CM | POA: Diagnosis not present

## 2019-01-17 DIAGNOSIS — M5136 Other intervertebral disc degeneration, lumbar region: Secondary | ICD-10-CM | POA: Diagnosis not present

## 2019-04-09 DIAGNOSIS — H26493 Other secondary cataract, bilateral: Secondary | ICD-10-CM | POA: Diagnosis not present

## 2019-04-09 DIAGNOSIS — Z961 Presence of intraocular lens: Secondary | ICD-10-CM | POA: Diagnosis not present

## 2019-04-09 DIAGNOSIS — H401131 Primary open-angle glaucoma, bilateral, mild stage: Secondary | ICD-10-CM | POA: Diagnosis not present

## 2019-07-03 ENCOUNTER — Ambulatory Visit: Payer: Medicare Other | Admitting: Occupational Therapy

## 2019-07-03 ENCOUNTER — Ambulatory Visit: Payer: Medicare Other

## 2019-07-03 ENCOUNTER — Ambulatory Visit: Payer: Medicare Other | Attending: Diagnostic Neuroimaging | Admitting: Physical Therapy

## 2019-07-03 ENCOUNTER — Other Ambulatory Visit: Payer: Self-pay

## 2019-07-03 DIAGNOSIS — R471 Dysarthria and anarthria: Secondary | ICD-10-CM

## 2019-07-03 DIAGNOSIS — R278 Other lack of coordination: Secondary | ICD-10-CM

## 2019-07-03 DIAGNOSIS — R29818 Other symptoms and signs involving the nervous system: Secondary | ICD-10-CM

## 2019-07-03 NOTE — Therapy (Signed)
[facility] [address] [place], [place], [address] Phone: [phone]   Fax:  [phone]  Patient Details  Name: Tracy Bernard MRN: [id number] Date of Birth: [birth year] Referring Provider:  Penni Bombard, MD  Encounter Date: 07/03/2019  Speech Therapy Parkinson's Disease Screen   Decibel Level today: mid to upper 60s dB  (WNL=70-72 dB) with sound level meter 30cm away from pt's mouth. Pt's conversational volume is lower than the expected volume. Pt tells SLP her daughter has to frequently ask her to "speak up" for approx two years, which demonstrates pt is using a reduced volume in everyday life.  Pt does not endorse difficulty in swallowing warranting further evaluation for dysphagia, at this time.   Pt would benefit from speech-language eval for dysarthria  - please order via Epic.   [facility] ,[place], [place] 07/03/2019, 1:29 PM  [facility] [address] [place], [place], [address] Phone: [phone]   Fax:  [phone]

## 2019-07-03 NOTE — Therapy (Signed)
[facility] [address] [place], [place], [address] Phone: [phone]   Fax:  [phone]  Patient Details  Name: Tracy Bernard MRN: [id number] Date of Birth: [birth year] Referring Provider:  Cari Caraway, MD  Encounter Date: 07/03/2019   Physical Therapy Parkinson's Disease Screen   Timed Up and Go test: 9.47 sec  10 meter walk test: 11.31 (2.9 ft/sec), 10 sec (3.28 ft/sec)  5 time sit to stand test:  11.94 sec   Patient does not require Physical Therapy services at this time.  Recommend Physical Therapy screen in 6-8 months.       Cherrill Scrima W. 07/03/2019, 1:19 PM  Mady Haagensen, PT 07/03/19 1:45 PM Phone: [phone] Fax: [facility] [address] [place], [place], [address] Phone: [phone]   Fax:  [phone]

## 2019-07-03 NOTE — Therapy (Signed)
[facility] [address] [place], [place], [address] Phone: [phone]   Fax:  [phone]  Patient Details  Name: FOTINI BARTOL MRN: [id number] Date of Birth: Aug 29, 1931 Referring Provider:  Penni Bombard, MD  Encounter Date: 07/03/2019  Occupational Therapy Parkinson's Disease Screen  Hand dominance:  RUE  Physical Performance Test item #2 (simulated eating):  14.12 sec  Physical Performance Test item #4 (donning/doffing jacket):  15.16 sec  Fastening/unfastening 3 buttons in:  38.16 sec  9-hole peg test:    RUE  63 secs sec        LUE  36.40 sec    Change in ability to perform ADLs/IADLs:  Difficulty with fine motor using RUE  Pt would benefit from occupational therapy evaluation due to  Decline in fine motor skills for ADLs.   Skiler Olden 07/03/2019, 3:04 PM  [facility] [address] [place], [place], [address] Phone: [phone]   Fax:  [phone]

## 2019-07-04 DIAGNOSIS — Z803 Family history of malignant neoplasm of breast: Secondary | ICD-10-CM | POA: Diagnosis not present

## 2019-07-04 DIAGNOSIS — Z1231 Encounter for screening mammogram for malignant neoplasm of breast: Secondary | ICD-10-CM | POA: Diagnosis not present

## 2019-07-11 ENCOUNTER — Ambulatory Visit: Payer: Medicare Other | Admitting: Occupational Therapy

## 2019-07-11 ENCOUNTER — Ambulatory Visit: Payer: Medicare Other

## 2019-07-11 DIAGNOSIS — D485 Neoplasm of uncertain behavior of skin: Secondary | ICD-10-CM | POA: Diagnosis not present

## 2019-07-11 DIAGNOSIS — C4442 Squamous cell carcinoma of skin of scalp and neck: Secondary | ICD-10-CM | POA: Diagnosis not present

## 2019-07-11 DIAGNOSIS — Z85828 Personal history of other malignant neoplasm of skin: Secondary | ICD-10-CM | POA: Diagnosis not present

## 2019-07-11 DIAGNOSIS — L821 Other seborrheic keratosis: Secondary | ICD-10-CM | POA: Diagnosis not present

## 2019-07-11 DIAGNOSIS — L820 Inflamed seborrheic keratosis: Secondary | ICD-10-CM | POA: Diagnosis not present

## 2019-07-12 DIAGNOSIS — H401131 Primary open-angle glaucoma, bilateral, mild stage: Secondary | ICD-10-CM | POA: Diagnosis not present

## 2019-07-12 DIAGNOSIS — Z961 Presence of intraocular lens: Secondary | ICD-10-CM | POA: Diagnosis not present

## 2019-07-12 DIAGNOSIS — H26493 Other secondary cataract, bilateral: Secondary | ICD-10-CM | POA: Diagnosis not present

## 2019-07-17 ENCOUNTER — Telehealth: Payer: Self-pay | Admitting: Occupational Therapy

## 2019-07-17 DIAGNOSIS — R29898 Other symptoms and signs involving the musculoskeletal system: Secondary | ICD-10-CM

## 2019-07-17 DIAGNOSIS — G20A1 Parkinson's disease without dyskinesia, without mention of fluctuations: Secondary | ICD-10-CM

## 2019-07-17 DIAGNOSIS — G20 Parkinson's disease: Secondary | ICD-10-CM

## 2019-07-17 DIAGNOSIS — R29818 Other symptoms and signs involving the nervous system: Secondary | ICD-10-CM

## 2019-07-17 DIAGNOSIS — R479 Unspecified speech disturbances: Secondary | ICD-10-CM

## 2019-07-17 DIAGNOSIS — R278 Other lack of coordination: Secondary | ICD-10-CM

## 2019-07-17 NOTE — Telephone Encounter (Signed)
Dr. Leta Baptist, Neita Goodnight was seen  for PD screens. She demonstrates decreased speech volume and decreased fine motor coordination  and can benefit from ST and OT evaluations. as a result. These evaluations are scheduled for tomorrow a.m.  If you agree please make these referrals. Sincerely, Theone Murdoch, OTR/L

## 2019-07-17 NOTE — Telephone Encounter (Signed)
Received request for ST, OT from [facility], OT, [place]. Per Dr Leta Baptist, referrals placed as requested.

## 2019-07-18 ENCOUNTER — Ambulatory Visit: Payer: Medicare Other | Attending: Diagnostic Neuroimaging | Admitting: Occupational Therapy

## 2019-07-18 ENCOUNTER — Other Ambulatory Visit: Payer: Self-pay

## 2019-07-18 ENCOUNTER — Ambulatory Visit: Payer: Medicare Other

## 2019-07-18 ENCOUNTER — Encounter: Payer: Self-pay | Admitting: Occupational Therapy

## 2019-07-18 DIAGNOSIS — R471 Dysarthria and anarthria: Secondary | ICD-10-CM | POA: Diagnosis not present

## 2019-07-18 DIAGNOSIS — R29818 Other symptoms and signs involving the nervous system: Secondary | ICD-10-CM

## 2019-07-18 DIAGNOSIS — R29898 Other symptoms and signs involving the musculoskeletal system: Secondary | ICD-10-CM | POA: Diagnosis not present

## 2019-07-18 DIAGNOSIS — R2689 Other abnormalities of gait and mobility: Secondary | ICD-10-CM

## 2019-07-18 DIAGNOSIS — R41840 Attention and concentration deficit: Secondary | ICD-10-CM

## 2019-07-18 DIAGNOSIS — R278 Other lack of coordination: Secondary | ICD-10-CM | POA: Diagnosis not present

## 2019-07-18 DIAGNOSIS — R293 Abnormal posture: Secondary | ICD-10-CM | POA: Diagnosis not present

## 2019-07-18 DIAGNOSIS — R2681 Unsteadiness on feet: Secondary | ICD-10-CM | POA: Diagnosis not present

## 2019-07-18 NOTE — Patient Instructions (Signed)
  Do 5 loud "[place]" at home, twice each day.

## 2019-07-18 NOTE — Therapy (Signed)
[facility] [address] [place], [place], [address] Phone: [phone]   Fax:  [phone]  Speech Language Pathology Evaluation  Patient Details  Name: Tracy Bernard MRN: [id number] Date of Birth: Oct 27, 1931 Referring Provider (SLP): Andrey Spearman, MD   Encounter Date: 07/18/2019  End of Session - 07/18/19 1649    Visit Number  1    Number of Visits  17    Date for SLP Re-Evaluation  10/15/19   90 days   SLP Start Time  1017    SLP Stop Time   1100    SLP Time Calculation (min)  43 min    Activity Tolerance  Patient tolerated treatment well       Past Medical History:  Diagnosis Date  . CKD (chronic kidney disease)    "moderate"  . Hyperlipidemia   . Hypertension   . Spinal stenosis     Past Surgical History:  Procedure Laterality Date  . BREAST BIOPSY Left 1977   benign  . CATARACT EXTRACTION, BILATERAL Bilateral 2014  . CHOLECYSTECTOMY, LAPAROSCOPIC  2001  . TONSILLECTOMY    . TOTAL VAGINAL HYSTERECTOMY  1981   secondary to uterine prolapse    There were no vitals filed for this visit.  Subjective Assessment - 07/18/19 1024    Subjective  "(My daugher) said maybe it's her hearing."    Currently in Pain?  Yes    Pain Score  3     Pain Location  Back    Pain Orientation  Right    Pain Descriptors / Indicators  Sore    Pain Type  Chronic pain         SLP Evaluation [facility] - 07/18/19 1024      SLP Visit Information   SLP Received On  07/18/19    Referring Provider (SLP)  Andrey Spearman, MD    Onset Date  approx 2018    Medical Diagnosis  Parkinson's Disease      General Information   HPI  Pt had symptoms of PD for approx 2 years, diagnosed as benign essential tremor by pt's PCP. Approx 6 months ago pt rec'd PD diagnosis.       Balance Screen   Has the patient fallen in the past 6 months  Yes    How many times?  1      Prior Functional Status   Cognitive/Linguistic Baseline   Within functional limits      Cognition   Overall Cognitive Status  Within Functional Limits for tasks assessed      Auditory Comprehension   Overall Auditory Comprehension  Appears within functional limits for tasks assessed      Verbal Expression   Overall Verbal Expression  Appears within functional limits for tasks assessed      Oral Motor/Sensory Function   Overall Oral Motor/Sensory Function  Other (comment)   deferred due to clinic masking policy     Motor Speech   Overall Motor Speech  Impaired    Respiration  Within functional limits    Phonation  Low vocal intensity   mid 60s dB 5 min conversation   Articulation  Within functional limitis    Intelligibility  Intelligible    Effective Techniques  Increased vocal intensity   mid 80s loud /a/;conversation WNL loudness for 2 minutes    Phonation  --   with cue to use "stronger voice like the /a/"  SLP Education - 07/18/19 1649    Education Details  Loud "ah", effects of PD on voice (limited abdominal activation, etc making softer voice)    Person(s) Educated  Patient    Methods  Explanation;Demonstration    Comprehension  Verbalized understanding;Returned demonstration;Need further instruction       SLP Short Term Goals - 07/18/19 1700      SLP SHORT TERM GOAL #1   Title  pt will produce simple conversation of 10 minutes at WNL volume in 3 sessions    Time  4    Period  Weeks    Status  New      SLP SHORT TERM GOAL #2   Title  pt will demo WNL loudness in question and answer tasks x 2 sessions    Time  4    Period  Weeks    Status  New      SLP SHORT TERM GOAL #3   Title  pt will demo loud /a/ with volume in low-mid 80s dB average in 3 sessions    Time  4    Period  Weeks    Status  New       SLP Long Term Goals - 07/18/19 1701      SLP LONG TERM GOAL #1   Title  pt will demo loud /a/ with volume in low-mid 80s dB average in 8 sessions    Time  8    Period  Weeks    or 17 sessions for all LTGs   Status  New      SLP LONG TERM GOAL #2   Title  pt will engage in 15 minutes mod complex/complex conversation with WNL volumes in 4 sessions    Time  8    Period  Weeks    Status  New      SLP LONG TERM GOAL #3   Title  pt will tell SLP 2 signs of dysphagia with modified indpendence over two sessions    Time  8    Period  Weeks    Status  New       Plan - 07/18/19 1650    Clinical Impression Statement  Pt presents today with mild-mod dysarhtria due to reduced vocal intensity/breath support in the form of less than optimal abdominal muscle activation. Pt produced conversation at suboptimal levels upon entry to [facility] room, however after performing loud /a/ at low to mid 80s dB with min cues from SLP for loudness she produced conversation with WNL volume for approx 2-3 minutes. She would benefit fom skilled ST to recalibrate her speech loudness in conversation to make communciation easier with family and friends.    Speech Therapy Frequency  2x / week    Duration  --   8 weeks or 17 visits   Treatment/Interventions  Patient/family education;Compensatory strategies;SLP instruction and feedback;Functional tasks    Potential to Achieve Goals  Good    SLP Home Exercise Plan  provided    Consulted and Agree with Plan of Care  Patient       Patient will benefit from skilled therapeutic intervention in order to improve the following deficits and impairments:   Dysarthria and anarthria - Plan: SLP plan of care cert/re-cert    Problem List Patient Active Problem List   Diagnosis Date Noted  . Vitamin D deficiency 12/17/2014  . Atrophic vaginitis 12/17/2014  . HTN (hypertension) 09/10/2013  . Hypercholesterolemia 09/10/2013  . Postmenopausal HRT (hormone replacement therapy) 09/10/2013  [facility] ,[place], [place]  07/18/2019, 5:06 PM  [facility] [address] [place], [place],  [address] Phone: [phone]   Fax:  [phone]  Name: Tracy Bernard MRN: [id number] Date of Birth: 17-Dec-1930

## 2019-07-18 NOTE — Therapy (Signed)
[facility] [address] [place], [place], [address] Phone: [phone]   Fax:  [phone]  Occupational Therapy Evaluation  Patient Details  Name: Tracy Bernard MRN: [id number] Date of Birth: [birth year] Referring Provider (OT): Dr. Andrey Spearman   Encounter Date: 07/18/2019  OT End of Session - 07/18/19 1603    Visit Number  1    Number of Visits  17    Date for OT Re-Evaluation  09/16/19    Authorization Type  Medicare & [employer] (covered 100%)    Authorization Time Period  cert. 07/18/19-10/15/19    Authorization - Visit Number  1    Authorization - Number of Visits  10    OT Start Time  1108    OT Stop Time  1148    OT Time Calculation (min)  40 min    Activity Tolerance  Patient tolerated treatment well    Behavior During Therapy  WFL for tasks assessed/performed       Past Medical History:  Diagnosis Date  . CKD (chronic kidney disease)    "moderate"  . Hyperlipidemia   . Hypertension   . Spinal stenosis     Past Surgical History:  Procedure Laterality Date  . BREAST BIOPSY Left 1977   benign  . CATARACT EXTRACTION, BILATERAL Bilateral 2014  . CHOLECYSTECTOMY, LAPAROSCOPIC  2001  . TONSILLECTOMY    . TOTAL VAGINAL HYSTERECTOMY  1981   secondary to uterine prolapse    There were no vitals filed for this visit.  Subjective Assessment - 07/18/19 1110    Subjective   it's not easy (with R hand)    Pertinent History  Parkinson's Disease (dx approx 6 months ago).   PMH:  hyperlipidemia, HTN, Spinal stenosis with chronic back pain, hs of cataract surgery and upcoming eye surgery    Patient Stated Goals  improve coordination/RUE stiffness; prevent future difficulty    Currently in Pain?  Yes    Pain Score  3     Pain Location  Back    Pain Orientation  Right    Pain Descriptors / Indicators  Sore;Sharp    Pain Type  Chronic pain    Pain Onset  More than a month ago    Pain Frequency  Constant    Aggravating Factors   standing    Pain Relieving Factors  sitting        [facility] OT Assessment - 07/18/19 1114      Assessment   Medical Diagnosis  Parkinson's Disease    Referring Provider (OT)  Dr. Andrey Spearman    Onset Date/Surgical Date  07/03/19   OT screen   Hand Dominance  Right    Next MD Visit  Dr. Aleen Campi    Prior Therapy  PT, no previous OT      Precautions   Precautions  Fall      Balance Screen   Has the patient fallen in the past 6 months  Yes    How many times?  1   tripped on threshold     Home  Environment   Family/patient expects to be discharged to:  Private residence    [place]  reading, playing solitare, check email, go out to eat      ADL   Eating/Feeding  --  min difficulty cutting food, difficulty opening jars   Grooming  Modified independent    Upper Body Bathing  Modified independent    Lower Body Bathing  Modified independent    Upper Body Dressing  --   difficulty with buttons   Lower Body Dressing  Modified independent    Toilet Transfer  Modified independent   has bar   Toileting - Clothing Manipulation  Modified independent    Toileting -  Hygiene  Modified Independent    Tub/Shower Transfer  Modified independent   get down in tub   Transfers/Ambulation Related to ADL's  pt reports difficulty turning in bed, difficulty getting in/out of car    demo small base of support when retrieving objects off floor   ADL comments  can't put back on earring       IADL   Shopping  Takes care of all shopping needs independently    Prior Level of Function Light Housekeeping  has cleaning lady, does light tasks    Prior Level of Function Meal Prep  just warms things up, difficulty cutting up things   dtr brings meals/visits 1x/month   Investment banker, corporate own vehicle    Medication Management  Is responsible for taking  medication in correct dosages at correct time   min difficulty picking up pills   Financial Management  Manages financial matters independently (budgets, writes checks, pays rent, bills goes to bank), collects and keeps track of income      Mobility   Mobility Status  History of falls    Mobility Status Comments  --      Written Expression   Dominant Hand  Right    Handwriting  100% legible;Increased time   very mild micrographia with ending letters intermittently     Vision - History   Visual History  Cataracts   upcoming surgeries     Cognition   Overall Cognitive Status  Impaired/Different from baseline    Attention  Divided   "not as sharp"   Memory  Impaired    Memory Impairment  Decreased short term memory    Bradyphrenia  Yes    Cognition Comments  Pt reports mild STM deficits, "not as sharp"      Observation/Other Assessments   Standing Functional Reach Test  R-9", L9"    Other Surveys   Select    Physical Performance Test    Yes    Simulated Eating Comments  17.75sec with 1 drop, holds spoon at end    Donning Doffing Jacket Time (seconds)  15.13sec with feet close together, no trunk rotation    Donning Doffing Jacket Comments  Fastening/unfastening 3 buttons in 25min 25sec      Posture/Postural Control   Posture/Postural Control  Postural limitations    Postural Limitations  Rounded Shoulders;Forward head;Flexed trunk      Coordination   9 Hole Peg Test  Right;Left    Right 9 Hole Peg Test  45.06   L hand assisted 1x   Left 9 Hole Peg Test  31.50    Box and Blocks  R-39blocks, L-46blocks    Other  bradykinesia noted with grasp      Tone   Assessment Location  Right Upper Extremity;Left Upper Extremity      ROM / Strength   AROM / PROM / Strength  AROM      AROM   Overall AROM   Deficits    Overall AROM Comments  Grossly WFL except noted  arthritic changes in bilateral hands, decr R shoulder flex (120* with -30* elbow ext) vs. L shoulder flex at 140* with  elbow ext WNL      RUE Tone   RUE Tone  Mild      LUE Tone   LUE Tone  Within Functional Limits                      OT Education - 07/18/19 1601    Education Details  OT eval results/POC    Person(s) Educated  Patient    Methods  Explanation    Comprehension  Verbalized understanding       OT Short Term Goals - 07/18/19 1611      OT SHORT TERM GOAL #1   Title  Pt will be independent with PD-specific HEP.--check STGs 08/17/19    Time  4    Period  Weeks    Status  New      OT SHORT TERM GOAL #2   Title  Pt will verbalize understanding of ways to decr risk of future complications related to PD and appropriate community resources.    Time  4    Period  Weeks    Status  New      OT SHORT TERM GOAL #3   Title  Pt will improve R shoulder flexion to at least 130* for functional reaching/ADLs.    Baseline  120* with -30* elbow ext    Time  4    Period  Weeks    Status  New      OT SHORT TERM GOAL #4   Title  Pt will improve bilateral hand coordination/dressing ability as shown by fastening/unfastening 3 buttons in less than 60sec in at least 2 trials.    Baseline  10min 25sec    Time  4    Period  Weeks    Status  New      OT SHORT TERM GOAL #5   Title  Pt will improve eating ability as shown by improving time on PPT #2 in 15sec or less without drops.    Baseline  17.75sec with 1 drop    Time  4    Period  Weeks        OT Long Term Goals - 07/18/19 2154      OT LONG TERM GOAL #1   Title  Pt will verbalize understanding of strategies for ADLs/IADLs to incr safety, decr risk for future complications, incr efficiency/ease (including strategies for picking items off floor, bed mobility, writing, cutting food, grasp, opening jars).--check LTGs 09/16/19    Time  8    Period  Weeks    Status  New      OT LONG TERM GOAL #2   Title  Pt will improve R shoulder flexion to at least 130* with at least -20* elbow ext for functional reaching/ADLs.    Baseline   120* with -30* elbow ext    Time  8    Period  Weeks    Status  New      OT LONG TERM GOAL #3   Title  Pt will improve dominant R hand coordination for ADLs as shown by improving time on 9-hole peg test by at least 10sec.    Baseline  45.06sec    Time  8    Period  Weeks    Status  New      OT LONG TERM GOAL #4   Title  Pt will improve dominant RUE functional reaching/coordination for ADLs as shown by improving score on box and blocks test by at least 6.    Baseline  39    Time  8    Period  Weeks    Status  New      OT LONG TERM GOAL #5   Title  Pt will improve bilateral hand coordination/dressing ability as shown by fastening/unfastening 3 buttons in less than 45sec in at least 2 trials.    Baseline  97min 25sec    Time  8    Period  Weeks    Status  New      Long Term Additional Goals   Additional Long Term Goals  Yes      OT LONG TERM GOAL #6   Title  Pt will verbalize understanding of memory compensation strategies and ways to keep thinking skills sharp.    Time  8    Period  Weeks    Status  New      OT LONG TERM GOAL #7   Title  Pt will improve efficiency with dressing as shown by competing PPT#4 in less than 12sec.    Baseline  15.13sec    Time  8    Period  Weeks    Status  New            Plan - 07/18/19 1604    Clinical Impression Statement  Pt is a 85 y.o. female with Parkinson's disease (diagnosed approx 6 months ago).  Pt with PMH that includes:  hyperlipidemia, HTN, Spinal stenosis with chronic back pain, hs of cataract surgery and upcoming eye surgery.  Pt lives alone and is independent, but reports incr difficulty with dominant RUE and slowing in activity.  Pt presents with bradykinesia, rigidity, decr coordination, decr posture, decr balance/functional mobility, decr ROM, and mild cognitive changes.  Pt would benefit from occupational therapy to address these deficits for improved dominant RUE functional use, incr safety/ease/efficiency with  ADLs/IADLs, and to decr risk of future complcations.    OT Occupational Profile and History  Detailed Assessment- Review of Records and additional review of physical, cognitive, psychosocial history related to current functional performance    Occupational performance deficits (Please refer to evaluation for details):  ADL's;IADL's;Leisure    Body Structure / Function / Physical Skills  ADL;Dexterity;ROM;Balance;IADL;Improper spinal/pelvic alignment;Mobility;Flexibility;Coordination;FMC;Tone;UE functional use;Decreased knowledge of use of [place]    Cognitive Skills  Memory;Attention;Thought    Rehab Potential  Good    Clinical Decision Making  Several treatment options, min-mod task modification necessary    Comorbidities Affecting Occupational Performance:  May have comorbidities impacting occupational performance    Modification or Assistance to Complete Evaluation   Min-Moderate modification of tasks or assist with assess necessary to complete eval    OT Frequency  2x / week    OT Duration  8 weeks   +eval   OT Treatment/Interventions  Self-care/ADL training;Moist Heat;Fluidtherapy;DME and/or AE instruction;Splinting;Balance training;Therapeutic activities;Therapeutic exercise;Cognitive remediation/compensation;Passive range of motion;Functional Mobility Training;Neuromuscular education;Cryotherapy;Energy conservation;Manual Therapy;Patient/family education    Plan  initiate HEP for large amplitude (?PWR! supine, sitting)    Recommended Other Services  current with ST    Consulted and Agree with Plan of Care  Patient       Patient will benefit from skilled therapeutic intervention in order to improve the following deficits and impairments:   Body Structure / Function / Physical Skills: ADL, Dexterity, ROM, Balance, IADL, Improper spinal/pelvic alignment, Mobility, Flexibility, Coordination, FMC, Tone,  UE functional use, Decreased knowledge of use of DME, GMC Cognitive Skills: Memory,  Attention, Thought     Visit Diagnosis: Other symptoms and signs involving the nervous system  Other lack of coordination  Other symptoms and signs involving the musculoskeletal system  Attention and concentration deficit  Other abnormalities of gait and mobility  Unsteadiness on feet  Abnormal posture    Problem List Patient Active Problem List   Diagnosis Date Noted  . Vitamin D deficiency 12/17/2014  . Atrophic vaginitis 12/17/2014  . HTN (hypertension) 09/10/2013  . Hypercholesterolemia 09/10/2013  . Postmenopausal HRT (hormone replacement therapy) 09/10/2013    [facility] 07/18/2019, 10:08 PM  [facility] [address] [place], [place], [address] Phone: [phone]   Fax:  [phone]  Name: Tracy Bernard MRN: [id number] Date of Birth: [birth year]   Vianne Bulls, OTR/L [facility] [address] [place], [place]  [address] [phone] phone [phone] 07/18/19 10:08 PM

## 2019-07-25 ENCOUNTER — Ambulatory Visit: Payer: Medicare Other

## 2019-07-25 ENCOUNTER — Encounter: Payer: Self-pay | Admitting: Occupational Therapy

## 2019-07-25 ENCOUNTER — Other Ambulatory Visit: Payer: Self-pay

## 2019-07-25 ENCOUNTER — Ambulatory Visit: Payer: Medicare Other | Admitting: Occupational Therapy

## 2019-07-25 DIAGNOSIS — R29818 Other symptoms and signs involving the nervous system: Secondary | ICD-10-CM | POA: Diagnosis not present

## 2019-07-25 DIAGNOSIS — R2689 Other abnormalities of gait and mobility: Secondary | ICD-10-CM

## 2019-07-25 DIAGNOSIS — R41840 Attention and concentration deficit: Secondary | ICD-10-CM | POA: Diagnosis not present

## 2019-07-25 DIAGNOSIS — R2681 Unsteadiness on feet: Secondary | ICD-10-CM | POA: Diagnosis not present

## 2019-07-25 DIAGNOSIS — R278 Other lack of coordination: Secondary | ICD-10-CM | POA: Diagnosis not present

## 2019-07-25 DIAGNOSIS — R471 Dysarthria and anarthria: Secondary | ICD-10-CM

## 2019-07-25 DIAGNOSIS — R29898 Other symptoms and signs involving the musculoskeletal system: Secondary | ICD-10-CM | POA: Diagnosis not present

## 2019-07-25 NOTE — Patient Instructions (Signed)
PWR! Hands  With arms stretched out in front of you (elbows straight), perform the following:  PWR! Rock: Move wrists up and down [employer]! Twist: Twist palms up and down BIG  Then, start with elbows bent and hands closed.  PWR! Up: Close hands and flick fingers open and apart BIG  PWR! Step: Touch index finger to thumb while keeping other fingers straight. Flick fingers out BIG (thumb out/straighten fingers). Repeat with other fingers. (Step your thumb to each finger).   Make each movement big and deliberate so that you feel the movement.  Perform at least 10 repetitions 1x/day, but perform PWR! hands throughout the day when you are having trouble using your hands (picking up/manipulating small objects, writing, eating, typing, sewing, buttoning, etc.).    Coordination Exercises  Perform the following exercises for 20 minutes 1 times per day. Perform with both hand(s). Perform using big movements.  Flipping Cards: Place deck of cards on the table. Flip cards over by opening your hand big to grasp and then turn your palm up big. Deal cards: Hold 1/2 or whole deck in your hand. Use thumb to push card off top of deck with one big push. Rotate ball with fingertips: Pick up with fingers/thumb and move as much as you can with each turn/movement (clockwise and counter-clockwise). Toss ball from one hand to the other: Toss big/high. Pick up coins and place in coin bank or container: Pick up with big, intentional movements. Do not drag coin to the edge. Pick up coins and stack one at a time: Pick up with big, intentional movements. Do not drag coin to the edge. (5-10 in a stack) Pick up 5-10 coins one at a time and hold in palm. Then, move coins from palm to fingertips one at time and place in coin bank/container. Practice writing: Slow down, write big, and focus on forming each letter. Perform "Flicks"/hand stretches (PWR! Hands): Close hands then flick out your fingers with focus on  opening hands, pulling wrists back, and extending elbows like you are pushing.Marland Kitchen

## 2019-07-25 NOTE — Therapy (Signed)
[facility] [address] [place], [place], [address] Phone: [phone]   Fax:  [phone]  Occupational Therapy Treatment  Patient Details  Name: Tracy Bernard MRN: [id number] Date of Birth: [birth year] Referring Provider (OT): Dr. Andrey Spearman   Encounter Date: 07/25/2019  OT End of Session - 07/25/19 1020    Visit Number  2    Number of Visits  17    Date for OT Re-Evaluation  09/16/19    Authorization Type  Medicare & [employer] (covered 100%)    Authorization Time Period  cert. 07/18/19-10/15/19    Authorization - Visit Number  2    Authorization - Number of Visits  10    OT Start Time  1018    OT Stop Time  1100    OT Time Calculation (min)  42 min    Activity Tolerance  Patient tolerated treatment well    Behavior During Therapy  WFL for tasks assessed/performed       Past Medical History:  Diagnosis Date  . CKD (chronic kidney disease)    "moderate"  . Hyperlipidemia   . Hypertension   . Spinal stenosis     Past Surgical History:  Procedure Laterality Date  . BREAST BIOPSY Left 1977   benign  . CATARACT EXTRACTION, BILATERAL Bilateral 2014  . CHOLECYSTECTOMY, LAPAROSCOPIC  2001  . TONSILLECTOMY    . TOTAL VAGINAL HYSTERECTOMY  1981   secondary to uterine prolapse    There were no vitals filed for this visit.  Subjective Assessment - 07/25/19 1019    Subjective   it's not easy (with R hand)    Pertinent History  Parkinson's Disease (dx approx 6 months ago).   PMH:  hyperlipidemia, HTN, Spinal stenosis with chronic back pain, hs of cataract surgery and upcoming eye surgery    Patient Stated Goals  improve coordination/RUE stiffness; prevent future difficulty    Currently in Pain?  Yes    Pain Score  4     Pain Location  Back    Pain Orientation  Posterior    Pain Descriptors / Indicators  Aching    Pain Type  Chronic pain    Pain Onset  More than a month ago    Pain Frequency  Intermittent    Aggravating Factors   standing    Pain Relieving Factors  sitting                           OT Treatment/Education - 07/25/19 1039    Education Details  PWR! sitting 10-20 reps each, pt has handout from previous therapy, min v.c for larger amplitude movements, PWR! hands basic 4, 5-10 reps each, min- mod  v.c and demo, coordination HEP- see pt instuctions    Person(s) Educated  Patient    Methods  Explanation;Demonstration;Verbal cues;Handout    Comprehension  Verbalized understanding;Returned demonstration;Verbal cues required       OT Short Term Goals - 07/18/19 1611      OT SHORT TERM GOAL #1   Title  Pt will be independent with PD-specific HEP.--check STGs 08/17/19    Time  4    Period  Weeks    Status  New      OT SHORT TERM GOAL #2   Title  Pt will verbalize understanding of ways to decr risk of future complications related to PD and appropriate community resources.    Time  4  Period  Weeks    Status  New      OT SHORT TERM GOAL #3   Title  Pt will improve R shoulder flexion to at least 130* for functional reaching/ADLs.    Baseline  120* with -30* elbow ext    Time  4    Period  Weeks    Status  New      OT SHORT TERM GOAL #4   Title  Pt will improve bilateral hand coordination/dressing ability as shown by fastening/unfastening 3 buttons in less than 60sec in at least 2 trials.    Baseline  79min 25sec    Time  4    Period  Weeks    Status  New      OT SHORT TERM GOAL #5   Title  Pt will improve eating ability as shown by improving time on PPT #2 in 15sec or less without drops.    Baseline  17.75sec with 1 drop    Time  4    Period  Weeks        OT Long Term Goals - 07/18/19 2154      OT LONG TERM GOAL #1   Title  Pt will verbalize understanding of strategies for ADLs/IADLs to incr safety, decr risk for future complications, incr efficiency/ease (including strategies for picking items off floor, bed mobility, writing, cutting food,  grasp, opening jars).--check LTGs 09/16/19    Time  8    Period  Weeks    Status  New      OT LONG TERM GOAL #2   Title  Pt will improve R shoulder flexion to at least 130* with at least -20* elbow ext for functional reaching/ADLs.    Baseline  120* with -30* elbow ext    Time  8    Period  Weeks    Status  New      OT LONG TERM GOAL #3   Title  Pt will improve dominant R hand coordination for ADLs as shown by improving time on 9-hole peg test by at least 10sec.    Baseline  45.06sec    Time  8    Period  Weeks    Status  New      OT LONG TERM GOAL #4   Title  Pt will improve dominant RUE functional reaching/coordination for ADLs as shown by improving score on box and blocks test by at least 6.    Baseline  39    Time  8    Period  Weeks    Status  New      OT LONG TERM GOAL #5   Title  Pt will improve bilateral hand coordination/dressing ability as shown by fastening/unfastening 3 buttons in less than 45sec in at least 2 trials.    Baseline  7min 25sec    Time  8    Period  Weeks    Status  New      Long Term Additional Goals   Additional Long Term Goals  Yes      OT LONG TERM GOAL #6   Title  Pt will verbalize understanding of memory compensation strategies and ways to keep thinking skills sharp.    Time  8    Period  Weeks    Status  New      OT LONG TERM GOAL #7   Title  Pt will improve efficiency with dressing as shown by competing PPT#4 in less than 12sec.  Baseline  15.13sec    Time  8    Period  Weeks    Status  New            Plan - 07/25/19 1159    Clinical Impression Statement  Pt is progressing towards goals. She demonstrates understanding of inital HEP, she can benefit from reinforcement/ review.    OT Occupational Profile and History  Detailed Assessment- Review of Records and additional review of physical, cognitive, psychosocial history related to current functional performance    Occupational performance deficits (Please refer to evaluation  for details):  ADL's;IADL's;Leisure    Body Structure / Function / Physical Skills  ADL;Dexterity;ROM;Balance;IADL;Improper spinal/pelvic alignment;Mobility;Flexibility;Coordination;FMC;Tone;UE functional use;Decreased knowledge of use of [place]    Cognitive Skills  Memory;Attention;Thought    Rehab Potential  Good    Clinical Decision Making  Several treatment options, min-mod task modification necessary    Comorbidities Affecting Occupational Performance:  May have comorbidities impacting occupational performance    Modification or Assistance to Complete Evaluation   Min-Moderate modification of tasks or assist with assess necessary to complete eval    OT Frequency  2x / week    OT Duration  8 weeks   +eval   OT Treatment/Interventions  Self-care/ADL training;Moist Heat;Fluidtherapy;DME and/or AE instruction;Splinting;Balance training;Therapeutic activities;Therapeutic exercise;Cognitive remediation/compensation;Passive range of motion;Functional Mobility Training;Neuromuscular education;Cryotherapy;Energy conservation;Manual Therapy;Patient/family education    Plan  review PWR! seated / coordination exercises prn, ADL strategies    Recommended Other Services  current with ST    Consulted and Agree with Plan of Care  Patient       Patient will benefit from skilled therapeutic intervention in order to improve the following deficits and impairments:   Body Structure / Function / Physical Skills: ADL, Dexterity, ROM, Balance, IADL, Improper spinal/pelvic alignment, Mobility, Flexibility, Coordination, FMC, Tone, UE functional use, Decreased knowledge of use of DME, GMC Cognitive Skills: Memory, Attention, Thought     Visit Diagnosis: Other symptoms and signs involving the nervous system  Other lack of coordination  Other symptoms and signs involving the musculoskeletal system  Attention and concentration deficit  Other abnormalities of gait and mobility    Problem List Patient  Active Problem List   Diagnosis Date Noted  . Vitamin D deficiency 12/17/2014  . Atrophic vaginitis 12/17/2014  . HTN (hypertension) 09/10/2013  . Hypercholesterolemia 09/10/2013  . Postmenopausal HRT (hormone replacement therapy) 09/10/2013    Ivo Moga 07/25/2019, 12:00 PM  [facility] [address] [place], [place], [address] Phone: [phone]   Fax:  [phone]  Name: Tracy Bernard MRN: [id number] Date of Birth: 22-Feb-1931

## 2019-07-25 NOTE — Therapy (Signed)
[facility] [address] [place], [place], [address] Phone: [phone]   Fax:  [phone]  Speech Language Pathology Treatment  Patient Details  Name: Tracy Bernard MRN: [id number] Date of Birth: 05/29/1931 Referring Provider (SLP): Andrey Spearman, MD   Encounter Date: 07/25/2019  End of Session - 07/25/19 1211    Visit Number  2    Number of Visits  17    Date for SLP Re-Evaluation  10/15/19    SLP Start Time  1105    SLP Stop Time   1145    SLP Time Calculation (min)  40 min    Activity Tolerance  Patient tolerated treatment well       Past Medical History:  Diagnosis Date  . CKD (chronic kidney disease)    "moderate"  . Hyperlipidemia   . Hypertension   . Spinal stenosis     Past Surgical History:  Procedure Laterality Date  . BREAST BIOPSY Left 1977   benign  . CATARACT EXTRACTION, BILATERAL Bilateral 2014  . CHOLECYSTECTOMY, LAPAROSCOPIC  2001  . TONSILLECTOMY    . TOTAL VAGINAL HYSTERECTOMY  1981   secondary to uterine prolapse    There were no vitals filed for this visit.  Subjective Assessment - 07/25/19 1109    Subjective  "I go into my closet to do those (ah)."    Currently in Pain?  No/denies            ADULT SLP TREATMENT - 07/25/19 1110      General Information   Behavior/Cognition  Alert;Cooperative;Pleasant mood      Treatment Provided   Treatment provided  Cognitive-Linquistic      Cognitive-Linquistic Treatment   Treatment focused on  Dysarthria    Skilled Treatment  SLP used loud /a/ to recalibrate pt's lower than WNL conversational volume - average low 80s dB. Pt mentioned to SLP enjoying comedy TV shows so SLP printed off facts about her two favorites: last man standing, and everybody loves raymond. Pt read sentences with volumes in low 70s dB, and multi-sentence selections (2-3 sentences) with intial min-mod cues for loudness decay, faded to independecnt.       Assessment / Recommendations / Plan   Plan  Continue with current plan of care      Progression Toward Goals   Progression toward goals  Progressing toward goals       SLP Education - 07/25/19 1210    Education Details  need to use louder speech in conversatoins    Person(s) Educated  Patient    Methods  Explanation    Comprehension  Verbalized understanding       SLP Short Term Goals - 07/25/19 1212      SLP SHORT TERM GOAL #1   Title  pt will produce simple conversation of 10 minutes at WNL volume in 3 sessions    Time  4    Period  Weeks    Status  On-going      SLP SHORT TERM GOAL #2   Title  pt will demo WNL loudness in question and answer tasks x 2 sessions    Time  4    Period  Weeks    Status  On-going      SLP SHORT TERM GOAL #3   Title  pt will demo loud /a/ with volume in low-mid 80s dB average in 3 sessions    Time  4    Period  Weeks  Status  On-going       SLP Long Term Goals - 07/25/19 1212      SLP LONG TERM GOAL #1   Title  pt will demo loud /a/ with volume in low-mid 80s dB average in 8 sessions    Time  8    Period  Weeks   or 17 sessions for all LTGs   Status  On-going      SLP LONG TERM GOAL #2   Title  pt will engage in 15 minutes mod complex/complex conversation with WNL volumes in 4 sessions    Time  8    Period  Weeks    Status  On-going      SLP LONG TERM GOAL #3   Title  pt will tell SLP 2 signs of dysphagia with modified indpendence over two sessions    Time  8    Period  Weeks    Status  On-going       Plan - 07/25/19 1211    Clinical Impression Statement  Pt presents today with mild-mod dysarthria due to reduced vocal intensity/breath support in the form of less than optimal abdominal muscle activation. Pt produced conversation at suboptimal levels upon entry to [place] room, however after performing loud /a/ at low to mid 80s dB with min cues from SLP for loudness she produced conversation with WNL volume for approx 2-3  minutes. She would benefit fom skilled ST to recalibrate her speech loudness in conversation to make communciation easier with family and friends.    Speech Therapy Frequency  2x / week    Duration  --   8 weeks or 17 visits   Treatment/Interventions  Patient/family education;Compensatory strategies;SLP instruction and feedback;Functional tasks    Potential to Achieve Goals  Good    SLP Home Exercise Plan  provided    Consulted and Agree with Plan of Care  Patient       Patient will benefit from skilled therapeutic intervention in order to improve the following deficits and impairments:   Dysarthria and anarthria    Problem List Patient Active Problem List   Diagnosis Date Noted  . Vitamin D deficiency 12/17/2014  . Atrophic vaginitis 12/17/2014  . HTN (hypertension) 09/10/2013  . Hypercholesterolemia 09/10/2013  . Postmenopausal HRT (hormone replacement therapy) 09/10/2013    [facility] ,MS, [place]  07/25/2019, 12:13 PM  [facility] [address] [place], [place], [address] Phone: [phone]   Fax:  [phone]   Name: Tracy Bernard MRN: [id number] Date of Birth: [birth year]

## 2019-07-25 NOTE — Patient Instructions (Signed)
Continue t opractice your loud /a/, and when you talk focus on using louder and stronger speech.

## 2019-07-27 ENCOUNTER — Ambulatory Visit: Payer: Medicare Other

## 2019-07-27 ENCOUNTER — Ambulatory Visit: Payer: Medicare Other | Admitting: Occupational Therapy

## 2019-08-01 ENCOUNTER — Encounter: Payer: Medicare Other | Admitting: Occupational Therapy

## 2019-08-03 ENCOUNTER — Ambulatory Visit: Payer: Medicare Other | Admitting: Occupational Therapy

## 2019-08-03 ENCOUNTER — Ambulatory Visit: Payer: Medicare Other

## 2019-08-03 ENCOUNTER — Other Ambulatory Visit: Payer: Self-pay

## 2019-08-03 ENCOUNTER — Encounter: Payer: Self-pay | Admitting: Occupational Therapy

## 2019-08-03 DIAGNOSIS — R293 Abnormal posture: Secondary | ICD-10-CM

## 2019-08-03 DIAGNOSIS — R29818 Other symptoms and signs involving the nervous system: Secondary | ICD-10-CM | POA: Diagnosis not present

## 2019-08-03 DIAGNOSIS — R29898 Other symptoms and signs involving the musculoskeletal system: Secondary | ICD-10-CM

## 2019-08-03 DIAGNOSIS — R278 Other lack of coordination: Secondary | ICD-10-CM

## 2019-08-03 DIAGNOSIS — R2681 Unsteadiness on feet: Secondary | ICD-10-CM | POA: Diagnosis not present

## 2019-08-03 DIAGNOSIS — R2689 Other abnormalities of gait and mobility: Secondary | ICD-10-CM | POA: Diagnosis not present

## 2019-08-03 DIAGNOSIS — R41840 Attention and concentration deficit: Secondary | ICD-10-CM | POA: Diagnosis not present

## 2019-08-03 DIAGNOSIS — R471 Dysarthria and anarthria: Secondary | ICD-10-CM

## 2019-08-03 NOTE — Therapy (Signed)
[facility] [address] [place], [place], [address] Phone: [phone]   Fax:  [phone]  Speech Language Pathology Treatment  Patient Details  Name: Tracy Bernard MRN: [id number] Date of Birth: November 28, 1930 Referring Provider (SLP): Andrey Spearman, MD   Encounter Date: 08/03/2019  End of Session - 08/03/19 1400    Visit Number  3    Number of Visits  17    Date for SLP Re-Evaluation  10/15/19    SLP Start Time  1320    SLP Stop Time   1400    SLP Time Calculation (min)  40 min    Activity Tolerance  Patient tolerated treatment well       Past Medical History:  Diagnosis Date  . CKD (chronic kidney disease)    "moderate"  . Hyperlipidemia   . Hypertension   . Spinal stenosis     Past Surgical History:  Procedure Laterality Date  . BREAST BIOPSY Left 1977   benign  . CATARACT EXTRACTION, BILATERAL Bilateral 2014  . CHOLECYSTECTOMY, LAPAROSCOPIC  2001  . TONSILLECTOMY    . TOTAL VAGINAL HYSTERECTOMY  1981   secondary to uterine prolapse    There were no vitals filed for this visit.  Subjective Assessment - 08/03/19 1333    Subjective  "I still do them in my closet."    Currently in Pain?  Yes    Pain Score  4     Pain Location  Back    Pain Orientation  Lower    Pain Descriptors / Indicators  Aching            ADULT SLP TREATMENT - 08/03/19 1334      General Information   Behavior/Cognition  Alert;Cooperative;Pleasant mood      Treatment Provided   Treatment provided  Cognitive-Linquistic      Cognitive-Linquistic Treatment   Treatment focused on  Dysarthria    Skilled Treatment  SLP used loud /a/ to recalibrate pt's lower than WNL conversational volume - pt had vocal strain today and so SLP modified pt production to "Hey-Ahhhh" to minimize vocal strain. With this, pt average mid 80s dB. Multi-sentence conversaiton snippets were completed with WNL volume however in conversation  (simple) pt req'd occasional min A for maintaining WNL volulme.       Assessment / Recommendations / Plan   Plan  Continue with current plan of care      Progression Toward Goals   Progression toward goals  Progressing toward goals         SLP Short Term Goals - 08/03/19 1401      SLP SHORT TERM GOAL #1   Title  pt will produce simple conversation of 10 minutes at WNL volume in 3 sessions    Time  3    Period  Weeks    Status  On-going      SLP SHORT TERM GOAL #2   Title  pt will demo WNL loudness in question and answer tasks x 2 sessions    Time  3    Period  Weeks    Status  On-going      SLP SHORT TERM GOAL #3   Title  pt will demo loud /a/  or "hey - ahhh" with volume in low-mid 80s dB average in 3 sessions    Baseline  08-03-19    Time  3    Period  Weeks    Status  Revised  SLP Long Term Goals - 08/03/19 1401      SLP LONG TERM GOAL #1   Title  pt will demo loud /a/ with volume in low-mid 80s dB average in 8 sessions    Time  8    Period  Weeks   or 17 sessions for all LTGs   Status  On-going      SLP LONG TERM GOAL #2   Title  pt will engage in 15 minutes mod complex/complex conversation with WNL volumes in 4 sessions    Time  8    Period  Weeks    Status  On-going      SLP LONG TERM GOAL #3   Title  pt will tell SLP 2 signs of dysphagia with modified indpendence over two sessions    Time  8    Period  Weeks    Status  On-going       Plan - 08/03/19 1401    Clinical Impression Statement  Pt presents today with mild-mod dysarthria due to reduced vocal intensity/breath support in the form of less than optimal abdominal muscle activation. Pt produced conversation at suboptimal levels upon entry to [place] room, however after performing loud /a/ at low to mid 80s dB with min cues from SLP for loudness she produced conversation with WNL volume for approx 2-3 minutes. She would benefit fom skilled ST to recalibrate her speech loudness in conversation to make  communciation easier with family and friends.    Speech Therapy Frequency  2x / week    Duration  --   8 weeks or 17 visits   Treatment/Interventions  Patient/family education;Compensatory strategies;SLP instruction and feedback;Functional tasks    Potential to Achieve Goals  Good    SLP Home Exercise Plan  provided    Consulted and Agree with Plan of Care  Patient       Patient will benefit from skilled therapeutic intervention in order to improve the following deficits and impairments:   Dysarthria and anarthria    Problem List Patient Active Problem List   Diagnosis Date Noted  . Vitamin D deficiency 12/17/2014  . Atrophic vaginitis 12/17/2014  . HTN (hypertension) 09/10/2013  . Hypercholesterolemia 09/10/2013  . Postmenopausal HRT (hormone replacement therapy) 09/10/2013    Garald Balding ,MS, [place]  08/03/2019, 2:02 PM  [facility] [address] [place], [place], [address] Phone: [phone]   Fax:  [phone]   Name: Tracy Bernard MRN: [id number] Date of Birth: April 17, 1931

## 2019-08-03 NOTE — Therapy (Signed)
[facility] [address] [place], [place], [address] Phone: [phone]   Fax:  [phone]  Occupational Therapy Treatment  Patient Details  Name: Tracy Bernard MRN: [id number] Date of Birth: 1931/06/01 Referring Provider (OT): Dr. Andrey Spearman   Encounter Date: 08/03/2019  OT End of Session - 08/03/19 1244    Visit Number  3    Number of Visits  17    Date for OT Re-Evaluation  09/16/19    Authorization Type  Medicare & [employer] (covered 100%)    Authorization Time Period  cert. 07/18/19-10/15/19    Authorization - Visit Number  3    Authorization - Number of Visits  10    OT Start Time  1237    OT Stop Time  1318    OT Time Calculation (min)  41 min    Activity Tolerance  Patient tolerated treatment well    Behavior During Therapy  WFL for tasks assessed/performed       Past Medical History:  Diagnosis Date  . CKD (chronic kidney disease)    "moderate"  . Hyperlipidemia   . Hypertension   . Spinal stenosis     Past Surgical History:  Procedure Laterality Date  . BREAST BIOPSY Left 1977   benign  . CATARACT EXTRACTION, BILATERAL Bilateral 2014  . CHOLECYSTECTOMY, LAPAROSCOPIC  2001  . TONSILLECTOMY    . TOTAL VAGINAL HYSTERECTOMY  1981   secondary to uterine prolapse    There were no vitals filed for this visit.  Subjective Assessment - 08/03/19 1238    Subjective   eye surgery next Wednesday    Pertinent History  Parkinson's Disease (dx approx 6 months ago).   PMH:  hyperlipidemia, HTN, Spinal stenosis with chronic back pain, hs of cataract surgery and upcoming eye surgery    Patient Stated Goals  improve coordination/RUE stiffness; prevent future difficulty    Currently in Pain?  No/denies    Pain Onset  More than a month ago        Educated pt on use of PWR! Hand as strategy for ADLs (grasp/release of objects).   Practiced donning/doffing jacket using large amplitude movement strategies  after initial instruction.  Pt demo improvement with repetition and use/min-mod cues for large amplitude movements.  (feet apart, trunk rotation when putting arms in/out--turn toward arm).  Pt educated to turn and look to where she is reaching to incr trunk rotation for incr ease and decr risk of future complications (ex:  Reaching for seat belt).  Pt verbalized understanding.         OT Education - 08/03/19 1247    Education Details  Reviewed PWR! Hands (basic 4) and coordination HEP--pt returned demo each    Person(s) Educated  Patient    Methods  Explanation;Demonstration;Verbal cues;Handout    Comprehension  Verbalized understanding;Returned demonstration;Verbal cues required       OT Short Term Goals - 07/18/19 1611      OT SHORT TERM GOAL #1   Title  Pt will be independent with PD-specific HEP.--check STGs 08/17/19    Time  4    Period  Weeks    Status  New      OT SHORT TERM GOAL #2   Title  Pt will verbalize understanding of ways to decr risk of future complications related to PD and appropriate community resources.    Time  4    Period  Weeks    Status  New  OT SHORT TERM GOAL #3   Title  Pt will improve R shoulder flexion to at least 130* for functional reaching/ADLs.    Baseline  120* with -30* elbow ext    Time  4    Period  Weeks    Status  New      OT SHORT TERM GOAL #4   Title  Pt will improve bilateral hand coordination/dressing ability as shown by fastening/unfastening 3 buttons in less than 60sec in at least 2 trials.    Baseline  23min 25sec    Time  4    Period  Weeks    Status  New      OT SHORT TERM GOAL #5   Title  Pt will improve eating ability as shown by improving time on PPT #2 in 15sec or less without drops.    Baseline  17.75sec with 1 drop    Time  4    Period  Weeks        OT Long Term Goals - 07/18/19 2154      OT LONG TERM GOAL #1   Title  Pt will verbalize understanding of strategies for ADLs/IADLs to incr safety, decr  risk for future complications, incr efficiency/ease (including strategies for picking items off floor, bed mobility, writing, cutting food, grasp, opening jars).--check LTGs 09/16/19    Time  8    Period  Weeks    Status  New      OT LONG TERM GOAL #2   Title  Pt will improve R shoulder flexion to at least 130* with at least -20* elbow ext for functional reaching/ADLs.    Baseline  120* with -30* elbow ext    Time  8    Period  Weeks    Status  New      OT LONG TERM GOAL #3   Title  Pt will improve dominant R hand coordination for ADLs as shown by improving time on 9-hole peg test by at least 10sec.    Baseline  45.06sec    Time  8    Period  Weeks    Status  New      OT LONG TERM GOAL #4   Title  Pt will improve dominant RUE functional reaching/coordination for ADLs as shown by improving score on box and blocks test by at least 6.    Baseline  39    Time  8    Period  Weeks    Status  New      OT LONG TERM GOAL #5   Title  Pt will improve bilateral hand coordination/dressing ability as shown by fastening/unfastening 3 buttons in less than 45sec in at least 2 trials.    Baseline  35min 25sec    Time  8    Period  Weeks    Status  New      Long Term Additional Goals   Additional Long Term Goals  Yes      OT LONG TERM GOAL #6   Title  Pt will verbalize understanding of memory compensation strategies and ways to keep thinking skills sharp.    Time  8    Period  Weeks    Status  New      OT LONG TERM GOAL #7   Title  Pt will improve efficiency with dressing as shown by competing PPT#4 in less than 12sec.    Baseline  15.13sec    Time  8    Period  Weeks    Status  New            Plan - 08/03/19 1245    OT Occupational Profile and History  Detailed Assessment- Review of Records and additional review of physical, cognitive, psychosocial history related to current functional performance    Occupational performance deficits (Please refer to evaluation for details):   ADL's;IADL's;Leisure    Body Structure / Function / Physical Skills  ADL;Dexterity;ROM;Balance;IADL;Improper spinal/pelvic alignment;Mobility;Flexibility;Coordination;FMC;Tone;UE functional use;Decreased knowledge of use of [place]    Cognitive Skills  Memory;Attention;Thought    Rehab Potential  Good    Clinical Decision Making  Several treatment options, min-mod task modification necessary    Comorbidities Affecting Occupational Performance:  May have comorbidities impacting occupational performance    Modification or Assistance to Complete Evaluation   Min-Moderate modification of tasks or assist with assess necessary to complete eval    OT Frequency  2x / week    OT Duration  8 weeks   +eval   OT Treatment/Interventions  Self-care/ADL training;Moist Heat;Fluidtherapy;DME and/or AE instruction;Splinting;Balance training;Therapeutic activities;Therapeutic exercise;Cognitive remediation/compensation;Passive range of motion;Functional Mobility Training;Neuromuscular education;Cryotherapy;Energy conservation;Manual Therapy;Patient/family education    Plan  review PWR! seated / coordination exercises prn, ADL strategies    Recommended Other Services  current with ST    Consulted and Agree with Plan of Care  Patient       Patient will benefit from skilled therapeutic intervention in order to improve the following deficits and impairments:   Body Structure / Function / Physical Skills: ADL, Dexterity, ROM, Balance, IADL, Improper spinal/pelvic alignment, Mobility, Flexibility, Coordination, FMC, Tone, UE functional use, Decreased knowledge of use of DME, GMC Cognitive Skills: Memory, Attention, Thought     Visit Diagnosis: Other symptoms and signs involving the nervous system  Other lack of coordination  Other symptoms and signs involving the musculoskeletal system  Attention and concentration deficit  Other abnormalities of gait and mobility  Unsteadiness on feet  Abnormal  posture    Problem List Patient Active Problem List   Diagnosis Date Noted  . Vitamin D deficiency 12/17/2014  . Atrophic vaginitis 12/17/2014  . HTN (hypertension) 09/10/2013  . Hypercholesterolemia 09/10/2013  . Postmenopausal HRT (hormone replacement therapy) 09/10/2013    [facility] 08/03/2019, 1:30 PM  [facility] [address] [place], [place], [address] Phone: [phone]   Fax:  [phone]  Name: Tracy Bernard MRN: [id number] Date of Birth: 12/27/1930   Vianne Bulls, OTR/L [facility] [address] [place], [place]  [address] [phone] phone [phone] 08/03/19 1:30 PM

## 2019-08-06 DIAGNOSIS — E782 Mixed hyperlipidemia: Secondary | ICD-10-CM | POA: Diagnosis not present

## 2019-08-06 DIAGNOSIS — N183 Chronic kidney disease, stage 3 (moderate): Secondary | ICD-10-CM | POA: Diagnosis not present

## 2019-08-06 DIAGNOSIS — M150 Primary generalized (osteo)arthritis: Secondary | ICD-10-CM | POA: Diagnosis not present

## 2019-08-06 DIAGNOSIS — G20 Parkinson's disease: Secondary | ICD-10-CM | POA: Diagnosis not present

## 2019-08-06 DIAGNOSIS — Z79899 Other long term (current) drug therapy: Secondary | ICD-10-CM | POA: Diagnosis not present

## 2019-08-06 DIAGNOSIS — N959 Unspecified menopausal and perimenopausal disorder: Secondary | ICD-10-CM | POA: Diagnosis not present

## 2019-08-06 DIAGNOSIS — K219 Gastro-esophageal reflux disease without esophagitis: Secondary | ICD-10-CM | POA: Diagnosis not present

## 2019-08-06 DIAGNOSIS — H409 Unspecified glaucoma: Secondary | ICD-10-CM | POA: Diagnosis not present

## 2019-08-06 DIAGNOSIS — Z1389 Encounter for screening for other disorder: Secondary | ICD-10-CM | POA: Diagnosis not present

## 2019-08-06 DIAGNOSIS — Z0000 Encounter for general adult medical examination without abnormal findings: Secondary | ICD-10-CM | POA: Diagnosis not present

## 2019-08-06 DIAGNOSIS — Z23 Encounter for immunization: Secondary | ICD-10-CM | POA: Diagnosis not present

## 2019-08-06 DIAGNOSIS — I10 Essential (primary) hypertension: Secondary | ICD-10-CM | POA: Diagnosis not present

## 2019-08-07 ENCOUNTER — Ambulatory Visit: Payer: Medicare Other | Admitting: Speech Pathology

## 2019-08-07 ENCOUNTER — Encounter: Payer: Self-pay | Admitting: Occupational Therapy

## 2019-08-07 ENCOUNTER — Other Ambulatory Visit: Payer: Self-pay

## 2019-08-07 ENCOUNTER — Ambulatory Visit: Payer: Medicare Other | Admitting: Occupational Therapy

## 2019-08-07 DIAGNOSIS — R293 Abnormal posture: Secondary | ICD-10-CM

## 2019-08-07 DIAGNOSIS — R2689 Other abnormalities of gait and mobility: Secondary | ICD-10-CM | POA: Diagnosis not present

## 2019-08-07 DIAGNOSIS — R29898 Other symptoms and signs involving the musculoskeletal system: Secondary | ICD-10-CM | POA: Diagnosis not present

## 2019-08-07 DIAGNOSIS — R2681 Unsteadiness on feet: Secondary | ICD-10-CM | POA: Diagnosis not present

## 2019-08-07 DIAGNOSIS — R41840 Attention and concentration deficit: Secondary | ICD-10-CM | POA: Diagnosis not present

## 2019-08-07 DIAGNOSIS — R471 Dysarthria and anarthria: Secondary | ICD-10-CM

## 2019-08-07 DIAGNOSIS — R29818 Other symptoms and signs involving the nervous system: Secondary | ICD-10-CM | POA: Diagnosis not present

## 2019-08-07 DIAGNOSIS — R278 Other lack of coordination: Secondary | ICD-10-CM

## 2019-08-07 NOTE — Therapy (Signed)
[facility] [address] [place], [place], [address] Phone: [phone]   Fax:  [phone]  Speech Language Pathology Treatment  Patient Details  Name: Tracy Bernard MRN: [id number] Date of Birth: February 26, 1931 Referring Provider (SLP): Andrey Spearman, MD   Encounter Date: 08/07/2019  End of Session - 08/07/19 1154    Visit Number  4    Number of Visits  17    Date for SLP Re-Evaluation  10/15/19    SLP Start Time  56    SLP Stop Time   1142    SLP Time Calculation (min)  40 min    Activity Tolerance  Patient tolerated treatment well       Past Medical History:  Diagnosis Date  . CKD (chronic kidney disease)    "moderate"  . Hyperlipidemia   . Hypertension   . Spinal stenosis     Past Surgical History:  Procedure Laterality Date  . BREAST BIOPSY Left 1977   benign  . CATARACT EXTRACTION, BILATERAL Bilateral 2014  . CHOLECYSTECTOMY, LAPAROSCOPIC  2001  . TONSILLECTOMY    . TOTAL VAGINAL HYSTERECTOMY  1981   secondary to uterine prolapse    There were no vitals filed for this visit.  Subjective Assessment - 08/07/19 1104    Subjective  "I go tomorrow for my lasic surgery."    Currently in Pain?  No/denies            ADULT SLP TREATMENT - 08/07/19 1105      General Information   Behavior/Cognition  Alert;Cooperative;Pleasant mood      Treatment Provided   Treatment provided  Cognitive-Linquistic      Pain Assessment   Pain Assessment  No/denies pain      Cognitive-Linquistic Treatment   Treatment focused on  Dysarthria    Skilled Treatment  Pt's initial conversation with SLP was sub-WNL, average mid-upper 60s dB at 30 cm. SLP used loud "Hey-ahhh" to recalibrate pt's loudness in structured tasks and spontaneous speech. Pt required mod cues for shaping "Hey-ahh," including verbal cues and modeling for breath prior to initiating phonation. Average upper 80s dB. SLP instructed pt to use  same effort as with loud /a/ in sentence level tasks, which averaged 75dB. Pt with intermittent carryover into sponatenous responses, and she self-corrected, repeating herself with greater intensity, on several occasions. In short responses with cognitive load, pt averaged low 70s dB. SLP instructed pt to do "Hey-ahhh" before having a phone call with her daughter and to use same effort in this conversation.      Assessment / Recommendations / Plan   Plan  Continue with current plan of care      Progression Toward Goals   Progression toward goals  Progressing toward goals         SLP Short Term Goals - 08/07/19 1155      SLP SHORT TERM GOAL #1   Title  pt will produce simple conversation of 10 minutes at WNL volume in 3 sessions    Time  2    Period  Weeks    Status  On-going      SLP SHORT TERM GOAL #2   Title  pt will demo WNL loudness in question and answer tasks x 2 sessions    Time  2    Period  Weeks    Status  On-going      SLP SHORT TERM GOAL #3   Title  pt will demo loud /a/  or "hey - ahhh" with volume in low-mid 80s dB average in 3 sessions    Baseline  08-03-19, 08-07-19    Time  2    Period  Weeks    Status  On-going       SLP Long Term Goals - 08/07/19 1156      SLP LONG TERM GOAL #1   Title  pt will demo loud /a/ with volume in low-mid 80s dB average in 8 sessions    Time  7    Period  Weeks   or 17 sessions for all LTGs   Status  On-going      SLP LONG TERM GOAL #2   Title  pt will engage in 15 minutes mod complex/complex conversation with WNL volumes in 4 sessions    Time  7    Period  Weeks    Status  On-going      SLP LONG TERM GOAL #3   Title  pt will tell SLP 2 signs of dysphagia with modified indpendence over two sessions    Time  7    Period  Weeks    Status  On-going       Plan - 08/07/19 1155    Clinical Impression Statement  Pt presents today with mild-mod dysarthria due to reduced vocal intensity/breath support in the form of less  than optimal abdominal muscle activation. Pt produced conversation at suboptimal levels upon entry to [facility] room, however after performing loud /a/ at low to mid 80s dB with min cues from SLP for loudness she produced conversation with WNL volume for approx 2-3 minutes. She would benefit fom skilled ST to recalibrate her speech loudness in conversation to make communciation easier with family and friends.    Speech Therapy Frequency  2x / week    Duration  --   8 weeks or 17 visits   Treatment/Interventions  Patient/family education;Compensatory strategies;SLP instruction and feedback;Functional tasks    Potential to Achieve Goals  Good    SLP Home Exercise Plan  provided    Consulted and Agree with Plan of Care  Patient       Patient will benefit from skilled therapeutic intervention in order to improve the following deficits and impairments:   Dysarthria and anarthria    Problem List Patient Active Problem List   Diagnosis Date Noted  . Vitamin D deficiency 12/17/2014  . Atrophic vaginitis 12/17/2014  . HTN (hypertension) 09/10/2013  . Hypercholesterolemia 09/10/2013  . Postmenopausal HRT (hormone replacement therapy) 09/10/2013   Deneise Lever, [place], [place] 08/07/2019, 11:57 AM  [facility] [address] [place], [place], [address] Phone: [phone]   Fax:  [phone]   Name: Tracy Bernard MRN: [id number] Date of Birth: [birth year]

## 2019-08-07 NOTE — Therapy (Signed)
[facility] [address] [place], [place], [address] Phone: [phone]   Fax:  [phone]  Occupational Therapy Treatment  Patient Details  Name: Tracy Bernard MRN: [id number] Date of Birth: 1931/11/05 Referring Provider (OT): Dr. Andrey Spearman   Encounter Date: 08/07/2019  OT End of Session - 08/07/19 1026    Visit Number  4    Number of Visits  17    Date for OT Re-Evaluation  09/16/19    Authorization Type  Medicare & [employer] (covered 100%)    Authorization Time Period  cert. 07/18/19-10/15/19    Authorization - Visit Number  4    Authorization - Number of Visits  10    OT Start Time  1023    OT Stop Time  1101    OT Time Calculation (min)  38 min    Activity Tolerance  Patient tolerated treatment well    Behavior During Therapy  WFL for tasks assessed/performed       Past Medical History:  Diagnosis Date  . CKD (chronic kidney disease)    "moderate"  . Hyperlipidemia   . Hypertension   . Spinal stenosis     Past Surgical History:  Procedure Laterality Date  . BREAST BIOPSY Left 1977   benign  . CATARACT EXTRACTION, BILATERAL Bilateral 2014  . CHOLECYSTECTOMY, LAPAROSCOPIC  2001  . TONSILLECTOMY    . TOTAL VAGINAL HYSTERECTOMY  1981   secondary to uterine prolapse    There were no vitals filed for this visit.  Subjective Assessment - 08/07/19 1048    Subjective   "I'm glad you reviewed that.  I need reminders."    Pertinent History  Parkinson's Disease (dx approx 6 months ago).   PMH:  hyperlipidemia, HTN, Spinal stenosis with chronic back pain, hs of cataract surgery and upcoming eye surgery    Patient Stated Goals  improve coordination/RUE stiffness; prevent future difficulty    Currently in Pain?  No/denies    Pain Onset  More than a month ago         PWR! Moves (basic 4) in sitting x 10-20 each with min cues For incr movement amplitude.    Reviewed importance of turning and look to  where she is reaching to incr trunk rotation for incr ease and decr risk of future complications (ex:  Reaching for seat belt and donning/doffing jacket).  Pt verbalized understanding.    Simulated ADLs with bag with focus/min cues for large amplitude movements:  Donning/doffing pull-over shirt, donning/doffing pants, pulling bag into hand for clothing adjustment/donning socks, donning bra/pulling shirt down in back/drying back. And discussed carryover of large amplitude movements into walking, reaching, wiping, washing, and dressing.  Pt verbalized understanding.           OT Short Term Goals - 07/18/19 1611      OT SHORT TERM GOAL #1   Title  Pt will be independent with PD-specific HEP.--check STGs 08/17/19    Time  4    Period  Weeks    Status  New      OT SHORT TERM GOAL #2   Title  Pt will verbalize understanding of ways to decr risk of future complications related to PD and appropriate community resources.    Time  4    Period  Weeks    Status  New      OT SHORT TERM GOAL #3   Title  Pt will improve R shoulder flexion to at least  130* for functional reaching/ADLs.    Baseline  120* with -30* elbow ext    Time  4    Period  Weeks    Status  New      OT SHORT TERM GOAL #4   Title  Pt will improve bilateral hand coordination/dressing ability as shown by fastening/unfastening 3 buttons in less than 60sec in at least 2 trials.    Baseline  58min 25sec    Time  4    Period  Weeks    Status  New      OT SHORT TERM GOAL #5   Title  Pt will improve eating ability as shown by improving time on PPT #2 in 15sec or less without drops.    Baseline  17.75sec with 1 drop    Time  4    Period  Weeks        OT Long Term Goals - 07/18/19 2154      OT LONG TERM GOAL #1   Title  Pt will verbalize understanding of strategies for ADLs/IADLs to incr safety, decr risk for future complications, incr efficiency/ease (including strategies for picking items off floor, bed mobility,  writing, cutting food, grasp, opening jars).--check LTGs 09/16/19    Time  8    Period  Weeks    Status  New      OT LONG TERM GOAL #2   Title  Pt will improve R shoulder flexion to at least 130* with at least -20* elbow ext for functional reaching/ADLs.    Baseline  120* with -30* elbow ext    Time  8    Period  Weeks    Status  New      OT LONG TERM GOAL #3   Title  Pt will improve dominant R hand coordination for ADLs as shown by improving time on 9-hole peg test by at least 10sec.    Baseline  45.06sec    Time  8    Period  Weeks    Status  New      OT LONG TERM GOAL #4   Title  Pt will improve dominant RUE functional reaching/coordination for ADLs as shown by improving score on box and blocks test by at least 6.    Baseline  39    Time  8    Period  Weeks    Status  New      OT LONG TERM GOAL #5   Title  Pt will improve bilateral hand coordination/dressing ability as shown by fastening/unfastening 3 buttons in less than 45sec in at least 2 trials.    Baseline  47min 25sec    Time  8    Period  Weeks    Status  New      Long Term Additional Goals   Additional Long Term Goals  Yes      OT LONG TERM GOAL #6   Title  Pt will verbalize understanding of memory compensation strategies and ways to keep thinking skills sharp.    Time  8    Period  Weeks    Status  New      OT LONG TERM GOAL #7   Title  Pt will improve efficiency with dressing as shown by competing PPT#4 in less than 12sec.    Baseline  15.13sec    Time  8    Period  Weeks    Status  New  Plan - 08/07/19 1047    Clinical Impression Statement  Pt is progressing towards goals with improving awareness of large amplitude movement strategies, but will continue to need repetition and cueing to maximize carryover/calibration.    OT Occupational Profile and History  Detailed Assessment- Review of Records and additional review of physical, cognitive, psychosocial history related to current  functional performance    Occupational performance deficits (Please refer to evaluation for details):  ADL's;IADL's;Leisure    Body Structure / Function / Physical Skills  ADL;Dexterity;ROM;Balance;IADL;Improper spinal/pelvic alignment;Mobility;Flexibility;Coordination;FMC;Tone;UE functional use;Decreased knowledge of use of [place]    Cognitive Skills  Memory;Attention;Thought    Rehab Potential  Good    Clinical Decision Making  Several treatment options, min-mod task modification necessary    Comorbidities Affecting Occupational Performance:  May have comorbidities impacting occupational performance    Modification or Assistance to Complete Evaluation   Min-Moderate modification of tasks or assist with assess necessary to complete eval    OT Frequency  2x / week    OT Duration  8 weeks   +eval   OT Treatment/Interventions  Self-care/ADL training;Moist Heat;Fluidtherapy;DME and/or AE instruction;Splinting;Balance training;Therapeutic activities;Therapeutic exercise;Cognitive remediation/compensation;Passive range of motion;Functional Mobility Training;Neuromuscular education;Cryotherapy;Energy conservation;Manual Therapy;Patient/family education    Plan  continue with ADL strategies--issue and review handout next session.    Recommended Other Services  current with ST    Consulted and Agree with Plan of Care  Patient       Patient will benefit from skilled therapeutic intervention in order to improve the following deficits and impairments:   Body Structure / Function / Physical Skills: ADL, Dexterity, ROM, Balance, IADL, Improper spinal/pelvic alignment, Mobility, Flexibility, Coordination, FMC, Tone, UE functional use, Decreased knowledge of use of DME, GMC Cognitive Skills: Memory, Attention, Thought     Visit Diagnosis: Other symptoms and signs involving the nervous system  Other lack of coordination  Other symptoms and signs involving the musculoskeletal system  Attention and  concentration deficit  Other abnormalities of gait and mobility  Unsteadiness on feet  Abnormal posture    Problem List Patient Active Problem List   Diagnosis Date Noted  . Vitamin D deficiency 12/17/2014  . Atrophic vaginitis 12/17/2014  . HTN (hypertension) 09/10/2013  . Hypercholesterolemia 09/10/2013  . Postmenopausal HRT (hormone replacement therapy) 09/10/2013    [facility] 08/07/2019, 10:50 AM  [facility] [address] [place], [place], [address] Phone: [phone]   Fax:  [phone]  Name: Tracy Bernard MRN: [id number] Date of Birth: 13-Oct-1931   Vianne Bulls, OTR/L [facility] [address] [place], [place]  [address] [phone] phone [phone] 08/07/19 10:50 AM

## 2019-08-08 DIAGNOSIS — H26492 Other secondary cataract, left eye: Secondary | ICD-10-CM | POA: Diagnosis not present

## 2019-08-10 ENCOUNTER — Other Ambulatory Visit: Payer: Self-pay

## 2019-08-10 ENCOUNTER — Encounter: Payer: Self-pay | Admitting: Occupational Therapy

## 2019-08-10 ENCOUNTER — Ambulatory Visit: Payer: Medicare Other

## 2019-08-10 ENCOUNTER — Ambulatory Visit: Payer: Medicare Other | Attending: Diagnostic Neuroimaging | Admitting: Occupational Therapy

## 2019-08-10 DIAGNOSIS — R278 Other lack of coordination: Secondary | ICD-10-CM

## 2019-08-10 DIAGNOSIS — R41840 Attention and concentration deficit: Secondary | ICD-10-CM | POA: Diagnosis not present

## 2019-08-10 DIAGNOSIS — R29898 Other symptoms and signs involving the musculoskeletal system: Secondary | ICD-10-CM | POA: Diagnosis not present

## 2019-08-10 DIAGNOSIS — R471 Dysarthria and anarthria: Secondary | ICD-10-CM | POA: Diagnosis not present

## 2019-08-10 DIAGNOSIS — R293 Abnormal posture: Secondary | ICD-10-CM | POA: Diagnosis not present

## 2019-08-10 DIAGNOSIS — R2689 Other abnormalities of gait and mobility: Secondary | ICD-10-CM | POA: Diagnosis not present

## 2019-08-10 DIAGNOSIS — R2681 Unsteadiness on feet: Secondary | ICD-10-CM | POA: Insufficient documentation

## 2019-08-10 DIAGNOSIS — R29818 Other symptoms and signs involving the nervous system: Secondary | ICD-10-CM | POA: Diagnosis not present

## 2019-08-10 NOTE — Patient Instructions (Signed)
   Performing Daily Activities with Big Movements  Pick at least 2 activities a day and perform with BIG, DELIBERATE movements/effort.  Try different activities each day. This can make the activity easier and turn daily activities into exercise to prevent problems in the future!  If you are standing during the activity, make sure to keep feet apart and stand with good/big/PWR! UP posture.  Examples:  Dressing - Push arms in sleeves, twist when putting on/off jacket, push foot into pants, open hands to pull down shirt/put on socks/pull up pants  Buttoning - Open hands big (PWR! Hands) before fastening each button  Bathing - Wash/dry with long strokes  Brushing your teeth - Big, slow movements  Cutting food - Long deliberate cuts  Eating - Hold utensil in the middle, not the end  Picking up a cup/bottle - Open hand up big and get object all the way in palm  Opening jar/bottle - Move as much as you can with each turn  Putting on seatbelt - Twist when reaching  Hanging up clothes/getting clothes down from closet - Reach with big effort  Putting away groceries/dishes - Reach with big effort  Wiping counter/table - Move in big, long strokes  Stirring while cooking - Exaggerate movement  Cleaning windows - Move in big, long strokes  Sweeping - Move arms in big, long strokes  Vacuuming - Push with big movement  Folding clothes - Exaggerate arm movements  Washing car - Move in big, long strokes  Raking - Move arms in big, long strokes  Changing light bulb - Move as much as you can with each turn  Using a screwdriver - Move as much as you can with each turn  Walking into a store/restaurant - Walk with big steps, swing arms if able  Standing up from a chair/recliner/sofa - Scoot forward, lean forward, and stand with big effort

## 2019-08-10 NOTE — Therapy (Signed)
[facility] [address] [place], [place], [address] Phone: [phone]   Fax:  [phone]  Occupational Therapy Treatment  Patient Details  Name: Tracy Bernard MRN: [id number] Date of Birth: February 16, 1931 Referring Provider (OT): Dr. Andrey Spearman   Encounter Date: 08/10/2019  OT End of Session - 08/10/19 1049    Visit Number  5    Number of Visits  17    Date for OT Re-Evaluation  09/16/19    Authorization Type  Medicare & [employer] (covered 100%)    Authorization Time Period  cert. 07/18/19-10/15/19    Authorization - Visit Number  5    Authorization - Number of Visits  10    OT Start Time  1021    OT Stop Time  1100    OT Time Calculation (min)  39 min    Activity Tolerance  Patient tolerated treatment well    Behavior During Therapy  WFL for tasks assessed/performed       Past Medical History:  Diagnosis Date  . CKD (chronic kidney disease)    "moderate"  . Hyperlipidemia   . Hypertension   . Spinal stenosis     Past Surgical History:  Procedure Laterality Date  . BREAST BIOPSY Left 1977   benign  . CATARACT EXTRACTION, BILATERAL Bilateral 2014  . CHOLECYSTECTOMY, LAPAROSCOPIC  2001  . TONSILLECTOMY    . TOTAL VAGINAL HYSTERECTOMY  1981   secondary to uterine prolapse    There were no vitals filed for this visit.  Subjective Assessment - 08/10/19 1023    Subjective   What did you tell me about my jacket    Pertinent History  Parkinson's Disease (dx approx 6 months ago).   PMH:  hyperlipidemia, HTN, Spinal stenosis with chronic back pain, hs of cataract surgery and upcoming eye surgery    Patient Stated Goals  improve coordination/RUE stiffness; prevent future difficulty    Currently in Pain?  No/denies    Pain Onset  More than a month ago         Practiced using large amplitude movement strategies for ADLs/IADLs:  Buttoning/unbuttoning, grasping bottles, opening bottles/jars/containers, sweeping,  sit>stand, donning/doffing jacket.  Pt returned demo with min cueing for large amplitude movement strategies after instruction.  Practiced writing, (cursive) to copy recipe.  Pt demo good legibility and size.  Trial of tan foam grip due to difficulty due to arthritis (particularly 3rd digit).  However, pt reports no significant improvement with built-up grip with 3rd digit stiffness/discomfort.  Recommended pens with built-in grips for slightly larger pen diameter.  Pt verbalized understanding.          OT Education - 08/10/19 1051    Education Details  Using Big Movements for ADLs/IADLs    Person(s) Educated  Patient    Methods  Explanation;Demonstration;Verbal cues;Handout    Comprehension  Verbalized understanding;Returned demonstration;Verbal cues required       OT Short Term Goals - 07/18/19 1611      OT SHORT TERM GOAL #1   Title  Pt will be independent with PD-specific HEP.--check STGs 08/17/19    Time  4    Period  Weeks    Status  New      OT SHORT TERM GOAL #2   Title  Pt will verbalize understanding of ways to decr risk of future complications related to PD and appropriate community resources.    Time  4    Period  Weeks    Status  New      OT SHORT TERM GOAL #3   Title  Pt will improve R shoulder flexion to at least 130* for functional reaching/ADLs.    Baseline  120* with -30* elbow ext    Time  4    Period  Weeks    Status  New      OT SHORT TERM GOAL #4   Title  Pt will improve bilateral hand coordination/dressing ability as shown by fastening/unfastening 3 buttons in less than 60sec in at least 2 trials.    Baseline  64min 25sec    Time  4    Period  Weeks    Status  New      OT SHORT TERM GOAL #5   Title  Pt will improve eating ability as shown by improving time on PPT #2 in 15sec or less without drops.    Baseline  17.75sec with 1 drop    Time  4    Period  Weeks        OT Long Term Goals - 07/18/19 2154      OT LONG TERM GOAL #1   Title  Pt  will verbalize understanding of strategies for ADLs/IADLs to incr safety, decr risk for future complications, incr efficiency/ease (including strategies for picking items off floor, bed mobility, writing, cutting food, grasp, opening jars).--check LTGs 09/16/19    Time  8    Period  Weeks    Status  New      OT LONG TERM GOAL #2   Title  Pt will improve R shoulder flexion to at least 130* with at least -20* elbow ext for functional reaching/ADLs.    Baseline  120* with -30* elbow ext    Time  8    Period  Weeks    Status  New      OT LONG TERM GOAL #3   Title  Pt will improve dominant R hand coordination for ADLs as shown by improving time on 9-hole peg test by at least 10sec.    Baseline  45.06sec    Time  8    Period  Weeks    Status  New      OT LONG TERM GOAL #4   Title  Pt will improve dominant RUE functional reaching/coordination for ADLs as shown by improving score on box and blocks test by at least 6.    Baseline  39    Time  8    Period  Weeks    Status  New      OT LONG TERM GOAL #5   Title  Pt will improve bilateral hand coordination/dressing ability as shown by fastening/unfastening 3 buttons in less than 45sec in at least 2 trials.    Baseline  50min 25sec    Time  8    Period  Weeks    Status  New      Long Term Additional Goals   Additional Long Term Goals  Yes      OT LONG TERM GOAL #6   Title  Pt will verbalize understanding of memory compensation strategies and ways to keep thinking skills sharp.    Time  8    Period  Weeks    Status  New      OT LONG TERM GOAL #7   Title  Pt will improve efficiency with dressing as shown by competing PPT#4 in less than 12sec.    Baseline  15.13sec    Time  8    Period  Weeks    Status  New            Plan - 08/10/19 1049    Clinical Impression Statement  Pt is progressing towards goals with improving movement amplitude and incr carry over of strategies.  However, pt will benefit from incr repetition and cueing  to maximize carryover and calibration.    OT Occupational Profile and History  Detailed Assessment- Review of Records and additional review of physical, cognitive, psychosocial history related to current functional performance    Occupational performance deficits (Please refer to evaluation for details):  ADL's;IADL's;Leisure    Body Structure / Function / Physical Skills  ADL;Dexterity;ROM;Balance;IADL;Improper spinal/pelvic alignment;Mobility;Flexibility;Coordination;FMC;Tone;UE functional use;Decreased knowledge of use of [place]    Cognitive Skills  Memory;Attention;Thought    Rehab Potential  Good    Clinical Decision Making  Several treatment options, min-mod task modification necessary    Comorbidities Affecting Occupational Performance:  May have comorbidities impacting occupational performance    Modification or Assistance to Complete Evaluation   Min-Moderate modification of tasks or assist with assess necessary to complete eval    OT Frequency  2x / week    OT Duration  8 weeks   +eval   OT Treatment/Interventions  Self-care/ADL training;Moist Heat;Fluidtherapy;DME and/or AE instruction;Splinting;Balance training;Therapeutic activities;Therapeutic exercise;Cognitive remediation/compensation;Passive range of motion;Functional Mobility Training;Neuromuscular education;Cryotherapy;Energy conservation;Manual Therapy;Patient/family education    Plan  continue with ADL strategies, coordination, functional reaching in standing    Recommended Other Services  current with ST    Consulted and Agree with Plan of Care  Patient       Patient will benefit from skilled therapeutic intervention in order to improve the following deficits and impairments:   Body Structure / Function / Physical Skills: ADL, Dexterity, ROM, Balance, IADL, Improper spinal/pelvic alignment, Mobility, Flexibility, Coordination, FMC, Tone, UE functional use, Decreased knowledge of use of DME, GMC Cognitive Skills: Memory,  Attention, Thought     Visit Diagnosis: Other symptoms and signs involving the nervous system  Other lack of coordination  Other symptoms and signs involving the musculoskeletal system  Attention and concentration deficit  Other abnormalities of gait and mobility  Unsteadiness on feet  Abnormal posture    Problem List Patient Active Problem List   Diagnosis Date Noted  . Vitamin D deficiency 12/17/2014  . Atrophic vaginitis 12/17/2014  . HTN (hypertension) 09/10/2013  . Hypercholesterolemia 09/10/2013  . Postmenopausal HRT (hormone replacement therapy) 09/10/2013    [facility] 08/10/2019, 10:52 AM  [facility] [address] [place], [place], [address] Phone: [phone]   Fax:  [phone]  Name: Tracy Bernard MRN: [id number] Date of Birth: 1931/10/23   Vianne Bulls, OTR/L [facility] [address] [place] [address] [place], [place]  [address] [phone] phone [phone] 08/10/19 10:52 AM

## 2019-08-10 NOTE — Therapy (Signed)
[facility] [address] [place], [place], [address] Phone: [phone]   Fax:  [phone]  Speech Language Pathology Treatment  Patient Details  Name: Tracy Bernard MRN: [id number] Date of Birth: 1931/06/01 Referring Provider (SLP): Andrey Spearman, MD   Encounter Date: 08/10/2019  End of Session - 08/10/19 1141    Visit Number  5    Number of Visits  17    Date for SLP Re-Evaluation  10/15/19    SLP Start Time  45    SLP Stop Time   1131    SLP Time Calculation (min)  29 min    Activity Tolerance  Patient tolerated treatment well       Past Medical History:  Diagnosis Date  . CKD (chronic kidney disease)    "moderate"  . Hyperlipidemia   . Hypertension   . Spinal stenosis     Past Surgical History:  Procedure Laterality Date  . BREAST BIOPSY Left 1977   benign  . CATARACT EXTRACTION, BILATERAL Bilateral 2014  . CHOLECYSTECTOMY, LAPAROSCOPIC  2001  . TONSILLECTOMY    . TOTAL VAGINAL HYSTERECTOMY  1981   secondary to uterine prolapse    There were no vitals filed for this visit.  Subjective Assessment - 08/10/19 1108    Subjective  "My friend that I talked to yesterday said I sounded like my old self again." "My daughter didn't have to ask me to repeat myself."    Currently in Pain?  No/denies            ADULT SLP TREATMENT - 08/10/19 1110      General Information   Behavior/Cognition  Alert;Cooperative;Pleasant mood      Treatment Provided   Treatment provided  Cognitive-Linquistic      Pain Assessment   Pain Assessment  No/denies pain      Cognitive-Linquistic Treatment   Treatment focused on  Dysarthria    Skilled Treatment  In initial 6 minute simple-mod complex conversation upon entering ST room pt was low 70s dB. Pt shared "s" with SLP. Simple conversation outdoors - pt maintained a volume level for 100% clear speech/intelligibility. When SLP and pt arrived back in room SLP  provided min A faded to independent for abdominal push vs. laryngeal push for /hey -ah/. Average loudness mid 80s dB. SLP discussed use of external cue for pt to have for maintaining loudness throuhtout the day  - pt stated a necklace would work best for her. As pt lives alone SLP told pt she needed to call 4 people each day and speak to them in her louder voice.      Assessment / Recommendations / Plan   Plan  Continue with current plan of care      Progression Toward Goals   Progression toward goals  Progressing toward goals       SLP Education - 08/10/19 1141    Education Details  external cues    Person(s) Educated  Patient    Methods  Explanation    Comprehension  Verbalized understanding       SLP Short Term Goals - 08/10/19 1143      SLP SHORT TERM GOAL #1   Title  pt will produce simple conversation of 10 minutes at WNL volume in 3 sessions    Time  2    Period  Weeks    Status  On-going      SLP SHORT TERM GOAL #2   Title  pt  will demo WNL loudness in question and answer tasks x 2 sessions    Baseline  08-10-19    Time  2    Period  Weeks    Status  On-going      SLP SHORT TERM GOAL #3   Title  pt will demo loud /a/  or "hey - ahhh" with volume in low-mid 80s dB average in 3 sessions    Baseline  08-03-19, 08-07-19, 08-10-19    Status  Achieved       SLP Long Term Goals - 08/10/19 1143      SLP LONG TERM GOAL #1   Title  pt will demo loud /a/ with volume in low-mid 80s dB average in 8 sessions    Time  7    Period  Weeks   or 17 sessions for all LTGs   Status  On-going      SLP LONG TERM GOAL #2   Title  pt will engage in 15 minutes mod complex/complex conversation with WNL volumes in 4 sessions    Time  7    Period  Weeks    Status  On-going      SLP LONG TERM GOAL #3   Title  pt will tell SLP 2 signs of dysphagia with modified indpendence over two sessions    Time  7    Period  Weeks    Status  On-going       Plan - 08/10/19 1142    Clinical  Impression Statement  Pt presents today with mild dysarthria due to reduced vocal intensity/breath support in the form of less than optimal abdominal muscle activation. Pt produced conversation at [facility] levels upon entry to [place] room.  She would benefit fom skilled ST to recalibrate her speech loudness in conversation to make communciation easier with family and friends.    Speech Therapy Frequency  2x / week    Duration  --   8 weeks or 17 visits   Treatment/Interventions  Patient/family education;Compensatory strategies;SLP instruction and feedback;Functional tasks    Potential to Achieve Goals  Good    SLP Home Exercise Plan  provided    Consulted and Agree with Plan of Care  Patient       Patient will benefit from skilled therapeutic intervention in order to improve the following deficits and impairments:   Dysarthria and anarthria    Problem List Patient Active Problem List   Diagnosis Date Noted  . Vitamin D deficiency 12/17/2014  . Atrophic vaginitis 12/17/2014  . HTN (hypertension) 09/10/2013  . Hypercholesterolemia 09/10/2013  . Postmenopausal HRT (hormone replacement therapy) 09/10/2013    [facility] ,MS, CCC-SLP  08/10/2019, 11:44 AM  [facility] [address] [place], [place], [address] Phone: [phone]   Fax:  [phone]   Name: Tracy Bernard MRN: [id number] Date of Birth: [birth year]

## 2019-08-14 ENCOUNTER — Encounter: Payer: Medicare Other | Admitting: Occupational Therapy

## 2019-08-15 ENCOUNTER — Ambulatory Visit: Payer: Medicare Other | Admitting: Occupational Therapy

## 2019-08-15 DIAGNOSIS — H26491 Other secondary cataract, right eye: Secondary | ICD-10-CM | POA: Diagnosis not present

## 2019-08-16 DIAGNOSIS — Z90710 Acquired absence of both cervix and uterus: Secondary | ICD-10-CM | POA: Diagnosis not present

## 2019-08-16 DIAGNOSIS — M85852 Other specified disorders of bone density and structure, left thigh: Secondary | ICD-10-CM | POA: Diagnosis not present

## 2019-08-16 DIAGNOSIS — G20 Parkinson's disease: Secondary | ICD-10-CM | POA: Diagnosis not present

## 2019-08-16 DIAGNOSIS — Z780 Asymptomatic menopausal state: Secondary | ICD-10-CM | POA: Diagnosis not present

## 2019-08-17 ENCOUNTER — Ambulatory Visit: Payer: Medicare Other | Admitting: Occupational Therapy

## 2019-08-17 ENCOUNTER — Ambulatory Visit: Payer: Medicare Other

## 2019-08-17 ENCOUNTER — Other Ambulatory Visit: Payer: Self-pay

## 2019-08-17 ENCOUNTER — Encounter: Payer: Self-pay | Admitting: Occupational Therapy

## 2019-08-17 DIAGNOSIS — R2681 Unsteadiness on feet: Secondary | ICD-10-CM

## 2019-08-17 DIAGNOSIS — R29898 Other symptoms and signs involving the musculoskeletal system: Secondary | ICD-10-CM

## 2019-08-17 DIAGNOSIS — R2689 Other abnormalities of gait and mobility: Secondary | ICD-10-CM

## 2019-08-17 DIAGNOSIS — R293 Abnormal posture: Secondary | ICD-10-CM

## 2019-08-17 DIAGNOSIS — R41840 Attention and concentration deficit: Secondary | ICD-10-CM | POA: Diagnosis not present

## 2019-08-17 DIAGNOSIS — R29818 Other symptoms and signs involving the nervous system: Secondary | ICD-10-CM

## 2019-08-17 DIAGNOSIS — R278 Other lack of coordination: Secondary | ICD-10-CM

## 2019-08-17 DIAGNOSIS — R471 Dysarthria and anarthria: Secondary | ICD-10-CM

## 2019-08-17 NOTE — Therapy (Signed)
[facility] [address] [place], [place], [address] Phone: [phone]   Fax:  [phone]  Speech Language Pathology Treatment  Patient Details  Name: Tracy Bernard MRN: [id number] Date of Birth: [birth year] Referring Provider (SLP): Andrey Spearman, MD   Encounter Date: 08/17/2019  End of Session - 08/17/19 1255    Visit Number  6    Number of Visits  17    Date for SLP Re-Evaluation  10/15/19    SLP Start Time  1148    SLP Stop Time   1225    SLP Time Calculation (min)  37 min    Activity Tolerance  Patient tolerated treatment well       Past Medical History:  Diagnosis Date  . CKD (chronic kidney disease)    "moderate"  . Hyperlipidemia   . Hypertension   . Spinal stenosis     Past Surgical History:  Procedure Laterality Date  . BREAST BIOPSY Left 1977   benign  . CATARACT EXTRACTION, BILATERAL Bilateral 2014  . CHOLECYSTECTOMY, LAPAROSCOPIC  2001  . TONSILLECTOMY    . TOTAL VAGINAL HYSTERECTOMY  1981   secondary to uterine prolapse    There were no vitals filed for this visit.  Subjective Assessment - 08/17/19 1155    Subjective  "My friend said I sounded like my old self."    Currently in Pain?  No/denies            ADULT SLP TREATMENT - 08/17/19 1156      General Information   Behavior/Cognition  Alert;Cooperative;Pleasant mood      Treatment Provided   Treatment provided  Cognitive-Linquistic      Cognitive-Linquistic Treatment   Treatment focused on  Dysarthria    Skilled Treatment  Initial greeting today 7 minutes was WNL volume. Pt req'd min A faded to independent for length of loud /a/ - average upper 80s- lower 90s dB. In conversation outdoors pt maintained WNL volume for 20 minutes conversation. Pt comfortable with one more session to verify progress - she is very pleased with the outcome of ST thus far.      Assessment / Recommendations / Plan   Plan  Continue with  current plan of care   likely d/c next session     Progression Toward Goals   Progression toward goals  Progressing toward goals         SLP Short Term Goals - 08/17/19 1257      SLP SHORT TERM GOAL #1   Title  pt will produce simple conversation of 10 minutes at WNL volume in 3 sessions    Baseline  08-17-19    Time  1    Period  Weeks    Status  On-going      SLP SHORT TERM GOAL #2   Title  pt will demo WNL loudness in question and answer tasks x 2 sessions    Baseline  08-10-19    Status  Deferred   pt progressed too quickly     SLP SHORT TERM GOAL #3   Title  pt will demo loud /a/  or "hey - ahhh" with volume in low-mid 80s dB average in 3 sessions    Baseline  08-03-19, 08-07-19, 08-10-19    Status  Achieved       SLP Long Term Goals - 08/17/19 1258      SLP LONG TERM GOAL #1   Title  pt will demo loud /a/ with  volume in low-mid 80s dB average in 8 sessions    Baseline  (3 sessions prior) 08-17-19    Time  6    Period  Weeks   or 17 sessions for all LTGs   Status  On-going      SLP LONG TERM GOAL #2   Title  pt will engage in 15 minutes mod complex/complex conversation with WNL volumes in 4 sessions    Baseline  08-17-19    Time  6    Period  Weeks    Status  On-going      SLP LONG TERM GOAL #3   Title  pt will tell SLP 2 signs of dysphagia with modified indpendence over two sessions    Time  6    Period  Weeks    Status  On-going       Plan - 08/17/19 1255    Clinical Impression Statement  Pt presents today with improving speech loudness. Pt produced conversation at [facility] levels upon entry to [place] room and for 20 minutes outdoors.  She would benefit fom 1-2  more skilled ST sessions to verify success with loudness her speech loudness in conversation to make communciation easier with family and friends.    Speech Therapy Frequency  2x / week    Duration  --   8 weeks or 17 visits   Treatment/Interventions  Patient/family education;Compensatory strategies;SLP  instruction and feedback;Functional tasks    Potential to Achieve Goals  Good    SLP Home Exercise Plan  provided    Consulted and Agree with Plan of Care  Patient       Patient will benefit from skilled therapeutic intervention in order to improve the following deficits and impairments:   Dysarthria and anarthria    Problem List Patient Active Problem List   Diagnosis Date Noted  . Vitamin D deficiency 12/17/2014  . Atrophic vaginitis 12/17/2014  . HTN (hypertension) 09/10/2013  . Hypercholesterolemia 09/10/2013  . Postmenopausal HRT (hormone replacement therapy) 09/10/2013    [facility] ,MS, CCC-SLP  08/17/2019, 12:59 PM  [facility] [address] [place], [place], [address] Phone: [phone]   Fax:  [phone]   Name: Tracy Bernard MRN: [id number] Date of Birth: October 21, 1931

## 2019-08-17 NOTE — Therapy (Signed)
[facility] [address] [place], [place], [address] Phone: [phone]   Fax:  [phone]  Occupational Therapy Treatment  Patient Details  Name: Tracy Bernard MRN: [id number] Date of Birth: 11-16-1930 Referring Provider (OT): Dr. Andrey Spearman   Encounter Date: 08/17/2019  OT End of Session - 08/17/19 1253    Visit Number  6    Number of Visits  17    Date for OT Re-Evaluation  09/16/19    Authorization Type  Medicare & [employer] (covered 100%)    Authorization Time Period  cert. 07/18/19-10/15/19    Authorization - Visit Number  6    Authorization - Number of Visits  10    OT Start Time  [id number]    OT Stop Time  1315    OT Time Calculation (min)  40 min    Activity Tolerance  Patient tolerated treatment well    Behavior During Therapy  WFL for tasks assessed/performed       Past Medical History:  Diagnosis Date  . CKD (chronic kidney disease)    "moderate"  . Hyperlipidemia   . Hypertension   . Spinal stenosis     Past Surgical History:  Procedure Laterality Date  . BREAST BIOPSY Left 1977   benign  . CATARACT EXTRACTION, BILATERAL Bilateral 2014  . CHOLECYSTECTOMY, LAPAROSCOPIC  2001  . TONSILLECTOMY    . TOTAL VAGINAL HYSTERECTOMY  1981   secondary to uterine prolapse    There were no vitals filed for this visit.  Subjective Assessment - 08/17/19 1235    Subjective   What did you tell me about my jacket    Pertinent History  Parkinson's Disease (dx approx 6 months ago).   PMH:  hyperlipidemia, HTN, Spinal stenosis with chronic back pain, hs of cataract surgery and upcoming eye surgery    Patient Stated Goals  improve coordination/RUE stiffness; prevent future difficulty    Currently in Pain?  Yes    Pain Score  3     Pain Location  Back    Pain Descriptors / Indicators  Aching    Pain Type  Chronic pain    Pain Onset  More than a month ago    Aggravating Factors   standing    Pain Relieving  Factors  rest    Multiple Pain Sites  No                Treatment: Dynamic functional reaching with Right and left UE's to copy small peg design for fine motor coordination with a cognitive component, min v.c for larger amplitude movements , min difficulty           OT Education - 08/17/19 1255    Education Details  Reviewed  coordination HEP--pt returned demo each, min v.c for larger amplitude movements    Person(s) Educated  Patient    Methods  Explanation;Demonstration;Verbal cues    Comprehension  Verbalized understanding;Returned demonstration;Verbal cues required       OT Short Term Goals - 07/18/19 1611      OT SHORT TERM GOAL #1   Title  Pt will be independent with PD-specific HEP.--check STGs 08/17/19    Time  4    Period  Weeks    Status  New      OT SHORT TERM GOAL #2   Title  Pt will verbalize understanding of ways to decr risk of future complications related to PD and appropriate community resources.  Time  4    Period  Weeks    Status  New      OT SHORT TERM GOAL #3   Title  Pt will improve R shoulder flexion to at least 130* for functional reaching/ADLs.    Baseline  120* with -30* elbow ext    Time  4    Period  Weeks    Status  New      OT SHORT TERM GOAL #4   Title  Pt will improve bilateral hand coordination/dressing ability as shown by fastening/unfastening 3 buttons in less than 60sec in at least 2 trials.    Baseline  26min 25sec    Time  4    Period  Weeks    Status  New      OT SHORT TERM GOAL #5   Title  Pt will improve eating ability as shown by improving time on PPT #2 in 15sec or less without drops.    Baseline  17.75sec with 1 drop    Time  4    Period  Weeks        OT Long Term Goals - 07/18/19 2154      OT LONG TERM GOAL #1   Title  Pt will verbalize understanding of strategies for ADLs/IADLs to incr safety, decr risk for future complications, incr efficiency/ease (including strategies for picking items off floor,  bed mobility, writing, cutting food, grasp, opening jars).--check LTGs 09/16/19    Time  8    Period  Weeks    Status  New      OT LONG TERM GOAL #2   Title  Pt will improve R shoulder flexion to at least 130* with at least -20* elbow ext for functional reaching/ADLs.    Baseline  120* with -30* elbow ext    Time  8    Period  Weeks    Status  New      OT LONG TERM GOAL #3   Title  Pt will improve dominant R hand coordination for ADLs as shown by improving time on 9-hole peg test by at least 10sec.    Baseline  45.06sec    Time  8    Period  Weeks    Status  New      OT LONG TERM GOAL #4   Title  Pt will improve dominant RUE functional reaching/coordination for ADLs as shown by improving score on box and blocks test by at least 6.    Baseline  39    Time  8    Period  Weeks    Status  New      OT LONG TERM GOAL #5   Title  Pt will improve bilateral hand coordination/dressing ability as shown by fastening/unfastening 3 buttons in less than 45sec in at least 2 trials.    Baseline  1min 25sec    Time  8    Period  Weeks    Status  New      Long Term Additional Goals   Additional Long Term Goals  Yes      OT LONG TERM GOAL #6   Title  Pt will verbalize understanding of memory compensation strategies and ways to keep thinking skills sharp.    Time  8    Period  Weeks    Status  New      OT LONG TERM GOAL #7   Title  Pt will improve efficiency with dressing as shown by competing PPT#4 in  less than 12sec.    Baseline  15.13sec    Time  8    Period  Weeks    Status  New            Plan - 08/17/19 1249    Clinical Impression Statement  Pt is progressing towards goals. She responds well to v.c for larger amplitude movements.    OT Occupational Profile and History  Detailed Assessment- Review of Records and additional review of physical, cognitive, psychosocial history related to current functional performance    Occupational performance deficits (Please refer to  evaluation for details):  ADL's;IADL's;Leisure    Body Structure / Function / Physical Skills  ADL;Dexterity;ROM;Balance;IADL;Improper spinal/pelvic alignment;Mobility;Flexibility;Coordination;FMC;Tone;UE functional use;Decreased knowledge of use of [place]    Cognitive Skills  Memory;Attention;Thought    Rehab Potential  Good    Clinical Decision Making  Several treatment options, min-mod task modification necessary    Comorbidities Affecting Occupational Performance:  May have comorbidities impacting occupational performance    Modification or Assistance to Complete Evaluation   Min-Moderate modification of tasks or assist with assess necessary to complete eval    OT Frequency  2x / week    OT Duration  8 weeks   +eval   OT Treatment/Interventions  Self-care/ADL training;Moist Heat;Fluidtherapy;DME and/or AE instruction;Splinting;Balance training;Therapeutic activities;Therapeutic exercise;Cognitive remediation/compensation;Passive range of motion;Functional Mobility Training;Neuromuscular education;Cryotherapy;Energy conservation;Manual Therapy;Patient/family education    Plan  continue with ADL strategies, coordination, functional reaching in standing    Recommended Other Services  current with ST    Consulted and Agree with Plan of Care  Patient       Patient will benefit from skilled therapeutic intervention in order to improve the following deficits and impairments:   Body Structure / Function / Physical Skills: ADL, Dexterity, ROM, Balance, IADL, Improper spinal/pelvic alignment, Mobility, Flexibility, Coordination, FMC, Tone, UE functional use, Decreased knowledge of use of DME, GMC Cognitive Skills: Memory, Attention, Thought     Visit Diagnosis: Other symptoms and signs involving the nervous system  Other lack of coordination  Other symptoms and signs involving the musculoskeletal system  Attention and concentration deficit  Other abnormalities of gait and  mobility  Unsteadiness on feet  Abnormal posture    Problem List Patient Active Problem List   Diagnosis Date Noted  . Vitamin D deficiency 12/17/2014  . Atrophic vaginitis 12/17/2014  . HTN (hypertension) 09/10/2013  . Hypercholesterolemia 09/10/2013  . Postmenopausal HRT (hormone replacement therapy) 09/10/2013    , 08/17/2019, 12:56 PM  [facility] [address] [place], [place], [address] Phone: [phone]   Fax:  [phone]  Name: Tracy Bernard MRN: [id number] Date of Birth: 26-Apr-1931

## 2019-08-22 ENCOUNTER — Ambulatory Visit: Payer: Medicare Other | Admitting: Speech Pathology

## 2019-08-22 ENCOUNTER — Ambulatory Visit: Payer: Medicare Other | Admitting: Occupational Therapy

## 2019-08-22 ENCOUNTER — Other Ambulatory Visit: Payer: Self-pay

## 2019-08-22 DIAGNOSIS — R2689 Other abnormalities of gait and mobility: Secondary | ICD-10-CM | POA: Diagnosis not present

## 2019-08-22 DIAGNOSIS — R29898 Other symptoms and signs involving the musculoskeletal system: Secondary | ICD-10-CM

## 2019-08-22 DIAGNOSIS — R29818 Other symptoms and signs involving the nervous system: Secondary | ICD-10-CM | POA: Diagnosis not present

## 2019-08-22 DIAGNOSIS — R2681 Unsteadiness on feet: Secondary | ICD-10-CM

## 2019-08-22 DIAGNOSIS — R278 Other lack of coordination: Secondary | ICD-10-CM | POA: Diagnosis not present

## 2019-08-22 DIAGNOSIS — R41840 Attention and concentration deficit: Secondary | ICD-10-CM | POA: Diagnosis not present

## 2019-08-22 NOTE — Therapy (Signed)
[facility] [address] [place], [place], [address] Phone: [phone]   Fax:  [phone]  Occupational Therapy Treatment  Patient Details  Name: Tracy Bernard MRN: [id number] Date of Birth: 1931-02-13 Referring Provider (OT): Dr. Andrey Spearman   Encounter Date: 08/22/2019  OT End of Session - 08/22/19 1140    Visit Number  7    Number of Visits  17    Date for OT Re-Evaluation  09/16/19    Authorization Type  Medicare & [employer] (covered 100%)    Authorization Time Period  cert. 07/18/19-10/15/19    Authorization - Visit Number  7    Authorization - Number of Visits  10    OT Start Time  1100    OT Stop Time  1145    OT Time Calculation (min)  45 min    Activity Tolerance  Patient tolerated treatment well    Behavior During Therapy  WFL for tasks assessed/performed       Past Medical History:  Diagnosis Date  . CKD (chronic kidney disease)    "moderate"  . Hyperlipidemia   . Hypertension   . Spinal stenosis     Past Surgical History:  Procedure Laterality Date  . BREAST BIOPSY Left 1977   benign  . CATARACT EXTRACTION, BILATERAL Bilateral 2014  . CHOLECYSTECTOMY, LAPAROSCOPIC  2001  . TONSILLECTOMY    . TOTAL VAGINAL HYSTERECTOMY  1981   secondary to uterine prolapse    There were no vitals filed for this visit.  Subjective Assessment - 08/22/19 1107    Pertinent History  Parkinson's Disease (dx approx 6 months ago).   PMH:  hyperlipidemia, HTN, Spinal stenosis with chronic back pain, hs of cataract surgery and upcoming eye surgery    Patient Stated Goals  improve coordination/RUE stiffness; prevent future difficulty    Currently in Pain?  Yes    Pain Score  3     Pain Location  Back    Pain Orientation  Lower    Pain Descriptors / Indicators  Aching    Pain Type  Chronic pain    Pain Onset  More than a month ago    Pain Frequency  Intermittent    Aggravating Factors   standing    Pain  Relieving Factors  rest        Reviewed large movement strategies for ADLS including: holding containers, cutting food, opening jars. Discussed ways to implement PWR! Moves into daily functional tasks/ADLS.  Briefly reviewed PWR! Hands. Therapist asked about ex's and pt reported she had so many - therapist instructed her to bring them all in next session to develop ex program w/ ex flowsheet for her.   Standing - worked on [employer]! [place], [place]! Twist and PWR! Step moves through functional dynamic standing tasks including: ipsilateral and contralateral reaching outside BOS, and PWR! Step through functional coordination and reaching tasks. Pt also shown PWR! Twist moves in corner for balance if needed.   UBE x 5 min. Level 3 maintaining > 30 rpm                     OT Short Term Goals - 07/18/19 1611      OT SHORT TERM GOAL #1   Title  Pt will be independent with PD-specific HEP.--check STGs 08/17/19    Time  4    Period  Weeks    Status  New      OT SHORT TERM GOAL #  2   Title  Pt will verbalize understanding of ways to decr risk of future complications related to PD and appropriate community resources.    Time  4    Period  Weeks    Status  New      OT SHORT TERM GOAL #3   Title  Pt will improve R shoulder flexion to at least 130* for functional reaching/ADLs.    Baseline  120* with -30* elbow ext    Time  4    Period  Weeks    Status  New      OT SHORT TERM GOAL #4   Title  Pt will improve bilateral hand coordination/dressing ability as shown by fastening/unfastening 3 buttons in less than 60sec in at least 2 trials.    Baseline  23min 25sec    Time  4    Period  Weeks    Status  New      OT SHORT TERM GOAL #5   Title  Pt will improve eating ability as shown by improving time on PPT #2 in 15sec or less without drops.    Baseline  17.75sec with 1 drop    Time  4    Period  Weeks        OT Long Term Goals - 07/18/19 2154      OT LONG TERM GOAL #1   Title   Pt will verbalize understanding of strategies for ADLs/IADLs to incr safety, decr risk for future complications, incr efficiency/ease (including strategies for picking items off floor, bed mobility, writing, cutting food, grasp, opening jars).--check LTGs 09/16/19    Time  8    Period  Weeks    Status  New      OT LONG TERM GOAL #2   Title  Pt will improve R shoulder flexion to at least 130* with at least -20* elbow ext for functional reaching/ADLs.    Baseline  120* with -30* elbow ext    Time  8    Period  Weeks    Status  New      OT LONG TERM GOAL #3   Title  Pt will improve dominant R hand coordination for ADLs as shown by improving time on 9-hole peg test by at least 10sec.    Baseline  45.06sec    Time  8    Period  Weeks    Status  New      OT LONG TERM GOAL #4   Title  Pt will improve dominant RUE functional reaching/coordination for ADLs as shown by improving score on box and blocks test by at least 6.    Baseline  39    Time  8    Period  Weeks    Status  New      OT LONG TERM GOAL #5   Title  Pt will improve bilateral hand coordination/dressing ability as shown by fastening/unfastening 3 buttons in less than 45sec in at least 2 trials.    Baseline  91min 25sec    Time  8    Period  Weeks    Status  New      Long Term Additional Goals   Additional Long Term Goals  Yes      OT LONG TERM GOAL #6   Title  Pt will verbalize understanding of memory compensation strategies and ways to keep thinking skills sharp.    Time  8    Period  Weeks    Status  New      OT LONG TERM GOAL #7   Title  Pt will improve efficiency with dressing as shown by competing PPT#4 in less than 12sec.    Baseline  15.13sec    Time  8    Period  Weeks    Status  New            Plan - 08/22/19 1141    Clinical Impression Statement  Pt is progressing towards goals. She responds well to v.c for larger amplitude movements.    Occupational performance deficits (Please refer to evaluation  for details):  ADL's;IADL's;Leisure    Body Structure / Function / Physical Skills  ADL;Dexterity;ROM;Balance;IADL;Improper spinal/pelvic alignment;Mobility;Flexibility;Coordination;FMC;Tone;UE functional use;Decreased knowledge of use of [place]    Cognitive Skills  Memory;Attention;Thought    Rehab Potential  Good    OT Frequency  2x / week    OT Duration  8 weeks    OT Treatment/Interventions  Self-care/ADL training;Moist Heat;Fluidtherapy;DME and/or AE instruction;Splinting;Balance training;Therapeutic activities;Therapeutic exercise;Cognitive remediation/compensation;Passive range of motion;Functional Mobility Training;Neuromuscular education;Cryotherapy;Energy conservation;Manual Therapy;Patient/family education    Plan  asked pt to bring in all ex's and create ex flowsheet (as pt feels a little overwhelmed)    Recommended Other Services  current with ST    Consulted and Agree with Plan of Care  Patient       Patient will benefit from skilled therapeutic intervention in order to improve the following deficits and impairments:   Body Structure / Function / Physical Skills: ADL, Dexterity, ROM, Balance, IADL, Improper spinal/pelvic alignment, Mobility, Flexibility, Coordination, FMC, Tone, UE functional use, Decreased knowledge of use of DME, GMC Cognitive Skills: Memory, Attention, Thought     Visit Diagnosis: Other symptoms and signs involving the nervous system  Other symptoms and signs involving the musculoskeletal system  Other lack of coordination  Unsteadiness on feet    Problem List Patient Active Problem List   Diagnosis Date Noted  . Vitamin D deficiency 12/17/2014  . Atrophic vaginitis 12/17/2014  . HTN (hypertension) 09/10/2013  . Hypercholesterolemia 09/10/2013  . Postmenopausal HRT (hormone replacement therapy) 09/10/2013    Carey Bullocks, OTR/L 08/22/2019, 12:13 PM  [facility] [address]  [place], [place], [address] Phone: [phone]   Fax:  [phone]  Name: KEYLE URSIN MRN: [id number] Date of Birth: [birth year]

## 2019-08-24 ENCOUNTER — Other Ambulatory Visit: Payer: Self-pay

## 2019-08-24 ENCOUNTER — Ambulatory Visit: Payer: Medicare Other | Admitting: Occupational Therapy

## 2019-08-24 ENCOUNTER — Encounter: Payer: Self-pay | Admitting: Occupational Therapy

## 2019-08-24 DIAGNOSIS — R2681 Unsteadiness on feet: Secondary | ICD-10-CM | POA: Diagnosis not present

## 2019-08-24 DIAGNOSIS — R2689 Other abnormalities of gait and mobility: Secondary | ICD-10-CM | POA: Diagnosis not present

## 2019-08-24 DIAGNOSIS — R29898 Other symptoms and signs involving the musculoskeletal system: Secondary | ICD-10-CM | POA: Diagnosis not present

## 2019-08-24 DIAGNOSIS — R29818 Other symptoms and signs involving the nervous system: Secondary | ICD-10-CM

## 2019-08-24 DIAGNOSIS — R41840 Attention and concentration deficit: Secondary | ICD-10-CM | POA: Diagnosis not present

## 2019-08-24 DIAGNOSIS — R293 Abnormal posture: Secondary | ICD-10-CM

## 2019-08-24 DIAGNOSIS — R278 Other lack of coordination: Secondary | ICD-10-CM | POA: Diagnosis not present

## 2019-08-24 NOTE — Therapy (Signed)
[facility] [address] [place], [place], [address] Phone: [phone]   Fax:  [phone]  Occupational Therapy Treatment  Patient Details  Name: Tracy Bernard MRN: [id number] Date of Birth: [birth year] Referring Provider (OT): Dr. Andrey Spearman   Encounter Date: 08/24/2019  OT End of Session - 08/24/19 1013    Visit Number  8    Number of Visits  17    Date for OT Re-Evaluation  09/16/19    Authorization Type  Medicare & AARP (covered 100%)    Authorization Time Period  cert. 07/18/19-10/15/19    Authorization - Visit Number  8    Authorization - Number of Visits  10    OT Start Time  1016    OT Stop Time  1055    OT Time Calculation (min)  39 min    Activity Tolerance  Patient tolerated treatment well    Behavior During Therapy  WFL for tasks assessed/performed       Past Medical History:  Diagnosis Date  . CKD (chronic kidney disease)    "moderate"  . Hyperlipidemia   . Hypertension   . Spinal stenosis     Past Surgical History:  Procedure Laterality Date  . BREAST BIOPSY Left 1977   benign  . CATARACT EXTRACTION, BILATERAL Bilateral 2014  . CHOLECYSTECTOMY, LAPAROSCOPIC  2001  . TONSILLECTOMY    . TOTAL VAGINAL HYSTERECTOMY  1981   secondary to uterine prolapse    There were no vitals filed for this visit.  Subjective Assessment - 08/24/19 1012    Subjective   I brought my exercises    Pertinent History  Parkinson's Disease (dx approx 6 months ago).   PMH:  hyperlipidemia, HTN, Spinal stenosis with chronic back pain, hs of cataract surgery and upcoming eye surgery    Patient Stated Goals  improve coordination/RUE stiffness; prevent future difficulty    Currently in Pain?  Yes    Pain Score  3     Pain Location  Back    Pain Orientation  Lower    Pain Descriptors / Indicators  Aching    Pain Type  Chronic pain    Pain Onset  More than a month ago    Pain Frequency  Intermittent    Aggravating Factors   standing, moving too much    Pain Relieving Factors  rest         Reviewed coordination HEP due to pt questions (all with each hand):  Flipping cards, dealing cards with thumb, rotating ball each direction, tossing ball between hands, tossing/catching ball with each hand, picking up and stacking coins, manipulating coins in hand to place in coin bank.      Begin checking STGs and discussing progress.--see below     OT Education - 08/24/19 1021    Education Details  PD Exercise Chart and how to use and set up example flow sheet    Person(s) Educated  Patient    Methods  Explanation;Demonstration;Verbal cues;Handout    Comprehension  Verbalized understanding       OT Short Term Goals - 08/24/19 1045      OT SHORT TERM GOAL #1   Title  Pt will be independent with PD-specific HEP.--check STGs 08/17/19    Time  4    Period  Weeks    Status  New      OT SHORT TERM GOAL #2   Title  Pt will verbalize understanding of ways to decr  risk of future complications related to PD and appropriate community resources.    Time  4    Period  Weeks    Status  New      OT SHORT TERM GOAL #3   Title  Pt will improve R shoulder flexion to at least 130* for functional reaching/ADLs.    Baseline  120* with -30* elbow ext    Time  4    Period  Weeks    Status  Achieved   08/24/19:  135* with -20* elbow ext     OT SHORT TERM GOAL #4   Title  Pt will improve bilateral hand coordination/dressing ability as shown by fastening/unfastening 3 buttons in less than 60sec in at least 2 trials.    Baseline  23mn 25sec    Time  4    Period  Weeks    Status  On-going   08/24/19:  58.72sec, 69.93sec     OT SHORT TERM GOAL #5   Title  Pt will improve eating ability as shown by improving time on PPT #2 in 15sec or less without drops.    Baseline  17.75sec with 1 drop    Time  4    Period  Weeks    Status  Achieved   08/24/19:  9.60sec       OT Long Term Goals - 08/24/19 1055       OT LONG TERM GOAL #1   Title  Pt will verbalize understanding of strategies for ADLs/IADLs to incr safety, decr risk for future complications, incr efficiency/ease (including strategies for picking items off floor, bed mobility, writing, cutting food, grasp, opening jars).--check LTGs 09/16/19    Time  8    Period  Weeks    Status  New      OT LONG TERM GOAL #2   Title  Pt will improve R shoulder flexion to at least 130* with at least -20* elbow ext for functional reaching/ADLs.    Baseline  120* with -30* elbow ext    Time  8    Period  Weeks    Status  Achieved   08/24/19:  135* with -20* elbow ext     OT LONG TERM GOAL #3   Title  Pt will improve dominant R hand coordination for ADLs as shown by improving time on 9-hole peg test by at least 10sec.    Baseline  45.06sec    Time  8    Period  Weeks    Status  New      OT LONG TERM GOAL #4   Title  Pt will improve dominant RUE functional reaching/coordination for ADLs as shown by improving score on box and blocks test by at least 6.    Baseline  39    Time  8    Period  Weeks    Status  New      OT LONG TERM GOAL #5   Title  Pt will improve bilateral hand coordination/dressing ability as shown by fastening/unfastening 3 buttons in less than 45sec in at least 2 trials.    Baseline  291m 25sec    Time  8    Period  Weeks    Status  New      OT LONG TERM GOAL #6   Title  Pt will verbalize understanding of memory compensation strategies and ways to keep thinking skills sharp.    Time  8    Period  Weeks    Status  New      OT LONG TERM GOAL #7   Title  Pt will improve efficiency with dressing as shown by competing PPT#4 in less than 12sec.    Baseline  15.13sec    Time  8    Period  Weeks    Status  New            Plan - 08/24/19 1013    Clinical Impression Statement  Pt is making good progress with STGs #3-4 met and demo significantly improved buttoning. She also verbalized understanding of use of PD  Exercise Chart/Flowsheet for routine and incr carryover.    Occupational performance deficits (Please refer to evaluation for details):  ADL's;IADL's;Leisure    Body Structure / Function / Physical Skills  ADL;Dexterity;ROM;Balance;IADL;Improper spinal/pelvic alignment;Mobility;Flexibility;Coordination;FMC;Tone;UE functional use;Decreased knowledge of use of [place]    Cognitive Skills  Memory;Attention;Thought    Rehab Potential  Good    OT Frequency  2x / week    OT Duration  8 weeks    OT Treatment/Interventions  Self-care/ADL training;Moist Heat;Fluidtherapy;DME and/or AE instruction;Splinting;Balance training;Therapeutic activities;Therapeutic exercise;Cognitive remediation/compensation;Passive range of motion;Functional Mobility Training;Neuromuscular education;Cryotherapy;Energy conservation;Manual Therapy;Patient/family education    Plan  PWR! moves; Functional reaching with large amplitude movements; Educate in community resouces; Check remaining STGs    Recommended Other Services  current with ST    Consulted and Agree with Plan of Care  Patient       Patient will benefit from skilled therapeutic intervention in order to improve the following deficits and impairments:   Body Structure / Function / Physical Skills: ADL, Dexterity, ROM, Balance, IADL, Improper spinal/pelvic alignment, Mobility, Flexibility, Coordination, FMC, Tone, UE functional use, Decreased knowledge of use of DME, GMC Cognitive Skills: Memory, Attention, Thought     Visit Diagnosis: Other symptoms and signs involving the nervous system  Other symptoms and signs involving the musculoskeletal system  Other lack of coordination  Unsteadiness on feet  Attention and concentration deficit  Other abnormalities of gait and mobility  Abnormal posture    Problem List Patient Active Problem List   Diagnosis Date Noted  . Vitamin D deficiency 12/17/2014  . Atrophic vaginitis 12/17/2014  . HTN (hypertension)  09/10/2013  . Hypercholesterolemia 09/10/2013  . Postmenopausal HRT (hormone replacement therapy) 09/10/2013    [facility] 08/24/2019, 10:56 AM  [facility] [address] [place], [place], [address] Phone: [phone]   Fax:  [phone]  Name: Tracy Bernard MRN: [id number] Date of Birth: 11-22-1930   Vianne Bulls, OTR/L [facility] [address] [place], [place]  [address] [phone] phone [phone] 08/24/19 10:56 AM

## 2019-08-24 NOTE — Patient Instructions (Addendum)
(  Exercise) Monday Tuesday Wednesday Thursday Friday Saturday Sunday   PWR! Supine  (on back)           PWR! sitting           PWR! standing           PWR! hands           Coordination (20min)           Bike  (73min)                                                               PWR! Hand Exercises  Then, start with elbows bent and hands closed:  PWR! Hands: Push hands out BIG. Elbows straight, wrists up, fingers open and spread apart BIG.   PWR! Step: Touch index finger to thumb while keeping other fingers straight. Flick fingers out BIG (thumb out/straighten fingers). Repeat with other fingers. (Step your thumb to each finger).  With arms stretched out in front of you (elbows straight), perform the following:  PWR! Rock:  Move wrists up and down [employer]! Twist: Twist palms up and down BIG  ** Make each movement big and deliberate so that you feel the movement.  Perform at least 10 repetitions 1x/day, but perform PWR! Hands throughout the day when you are having trouble using your hands (picking up/manipulating small objects, writing, eating, typing, sewing, buttoning, etc.).   Coordination Exercises  Perform the following exercises for 20 minutes 1 times per day. Perform with both hand(s). Perform using big movements.   Flipping Cards: Place deck of cards on the table. Flip cards over by opening your hand big to grasp and then turn your palm up big, opening hand fully to release.  Deal cards: Hold 1/2 or whole deck in your hand. Use thumb to push card off top of deck with one big push.  Rotate ball with fingertips: Pick up with fingers/thumb and move as much as you can with each turn/movement (clockwise and counter-clockwise).  Toss ball from one hand to the other: Toss big/high.  Deliberately open with toss and deliberately close hand after catch.  Toss ball in the air and catch with the same hand: Toss big/high.  Deliberately open  with toss and deliberately close hand after catch.  Pick up coins and stack one at a time: Pick up with big, intentional movements. Do not drag coin to the edge. (5-10 in a stack)  Pick up 5-10 coins one at a time and hold in palm. Then, move coins from palm to fingertips one at time and place in coin bank/container.  Practice writing: Slow down, write big, and focus on forming each letter.

## 2019-08-27 ENCOUNTER — Encounter: Payer: Self-pay | Admitting: Diagnostic Neuroimaging

## 2019-08-27 ENCOUNTER — Other Ambulatory Visit: Payer: Self-pay

## 2019-08-27 ENCOUNTER — Ambulatory Visit (INDEPENDENT_AMBULATORY_CARE_PROVIDER_SITE_OTHER): Payer: Medicare Other | Admitting: Diagnostic Neuroimaging

## 2019-08-27 VITALS — BP 118/74 | HR 78 | Temp 97.2°F | Ht 62.0 in | Wt 115.8 lb

## 2019-08-27 DIAGNOSIS — G20 Parkinson's disease: Secondary | ICD-10-CM | POA: Diagnosis not present

## 2019-08-27 DIAGNOSIS — M48062 Spinal stenosis, lumbar region with neurogenic claudication: Secondary | ICD-10-CM | POA: Diagnosis not present

## 2019-08-27 MED ORDER — CARBIDOPA-LEVODOPA 25-100 MG PO TABS: 1.0000 | ORAL_TABLET | Freq: Three times a day (TID) | ORAL | 4 refills | Status: DC

## 2019-08-27 NOTE — Progress Notes (Signed)
[facility]  PATIENT: Tracy Bernard DOB: 05-06-1931  REFERRING CLINICIAN: Amedeo Bernard HISTORY FROM: patient  REASON FOR VISIT: follow up   HISTORICAL  CHIEF COMPLAINT:  Chief Complaint  Patient presents with  . Parkinson's disease    rm 7, 8 month FU, "no new concerns"    HISTORY OF PRESENT ILLNESS:   UPDATE (08/27/19, VRP): Since last visit, doing well, except for 1 fall in March 2020 (left knee pain). Getting better. Symptoms are stable. No alleviating or aggravating factors. Tolerating carb/levo.    UPDATE (12/25/18, VRP): Since last visit, doing well. Symptoms are improved on carb/levo (tremor). BP continues to fluctuate. No alleviating or aggravating factors. Tolerating meds.    PRIOR HPI (08/21/28): 85 year old female here for evaluation of balance and tremor problems.  Patient has had progressive resting and postural tremor in the right upper and right lower extremities since 2016.  She has also had balance and gait difficulty in last 6 months, and has had several falls.  Patient lives alone.  Symptoms have been noticed by patient as well as her daughter.  Daughter has also noticed that patient's voice has become "soft".  Patient denies any swallowing problems or sialorrhea.  Patient has had lack of sense of smell for past 20 years.  She has mild constipation.  She has intermittent wild dreams.  She is just had problems with sleepwalking.  She has some insomnia issues.  Patient was diagnosed with possible essential tremor initially by PCP, but due to progression and change of symptoms consideration of Parkinson's disease was made.  Therefore patient was referred here for further evaluation.  No family history of tremor or Parkinson's disease.   REVIEW OF SYSTEMS: Full 14 system review of systems performed and negative with exception of: as per HPI.   ALLERGIES: Allergies  Allergen Reactions  . Lisinopril Other (See Comments)    cough  . Tramadol  Other (See Comments)    Sleepiness, confusion    HOME MEDICATIONS: Outpatient Medications Prior to Visit  Medication Sig Dispense Refill  . amLODipine (NORVASC) 2.5 MG tablet Take 2.5 mg by mouth daily.    . brimonidine (ALPHAGAN) 0.2 % ophthalmic solution Place 1 drop into both eyes 2 (two) times daily.     . Calcium Citrate-Vitamin D (CALCIUM CITRATE + D3) 200-250 MG-UNIT TABS Take 1 tablet by mouth daily.    . carbidopa-levodopa (SINEMET IR) 25-100 MG tablet Take 1 tablet by mouth 3 (three) times daily before meals. 270 tablet 4  . diphenhydramine-acetaminophen (TYLENOL PM) 25-500 MG TABS tablet Take 1 tablet by mouth at bedtime as needed (pain).    . fexofenadine (ALLEGRA) 180 MG tablet Take 180 mg by mouth daily.    . fluticasone (FLONASE) 50 MCG/ACT nasal spray Place 1-2 sprays into the nose daily as needed for allergies or rhinitis.     . folic acid (FOLVITE) A999333 MCG tablet Take 400 mcg by mouth daily.    . Glucosamine-Chondroit-Vit C-Mn (GLUCOSAMINE CHONDR 1500 COMPLX) CAPS Take 1 capsule by mouth daily.    Tracy Bernard Oil (OMEGA-3) 500 MG CAPS Take 1 capsule by mouth daily.    Marland Kitchen latanoprost (XALATAN) 0.005 % ophthalmic solution Place 1 drop into both eyes at bedtime.    Marland Kitchen losartan (COZAAR) 50 MG tablet Take 50 mg by mouth daily.    . Multiple Vitamin (MULTIVITAMIN WITH MINERALS) TABS tablet Take 1 tablet by mouth daily.    Marland Kitchen omeprazole (PRILOSEC) 20 MG capsule Take 20 mg  by mouth daily as needed (acid reflux).     . polycarbophil (FIBERCON) 625 MG tablet Take 625 mg by mouth daily.    Marland Kitchen saccharomyces boulardii (FLORASTOR) 250 MG capsule Take 250 mg by mouth daily.    . simvastatin (ZOCOR) 20 MG tablet Take 10 mg by mouth every evening.     . vitamin B-12 (CYANOCOBALAMIN) 1000 MCG tablet Take 1,000 mcg by mouth as directed. 3 times weekly.  Monday, Wednesday, Friday    . vitamin C (ASCORBIC ACID) 500 MG tablet Take 500 mg by mouth daily.    . vitamin E 400 UNIT capsule Take 400 Units  by mouth daily.    . cholecalciferol (VITAMIN D3) 25 MCG (1000 UT) tablet Take 1,000 Units by mouth daily.    Marland Kitchen HYDROcodone-acetaminophen (NORCO/VICODIN) 5-325 MG tablet Take 1 tablet by mouth every 6 (six) hours as needed. (Patient not taking: Reported on 08/27/2019) 10 tablet 0   No facility-administered medications prior to visit.     PAST MEDICAL HISTORY: Past Medical History:  Diagnosis Date  . CKD (chronic kidney disease)    "moderate"  . Hyperlipidemia   . Hypertension   . Spinal stenosis     PAST SURGICAL HISTORY: Past Surgical History:  Procedure Laterality Date  . BREAST BIOPSY Left 1977   benign  . CATARACT EXTRACTION, BILATERAL Bilateral 2014  . CHOLECYSTECTOMY, LAPAROSCOPIC  2001  . TONSILLECTOMY    . TOTAL VAGINAL HYSTERECTOMY  1981   secondary to uterine prolapse    FAMILY HISTORY: Family History  Problem Relation Age of Onset  . Breast cancer Sister   . Stroke Sister   . Kidney failure Mother   . Hypertension Mother   . Kidney failure Father   . Hypertension Father   . Colon cancer Daughter 107       resection of colon, had chemo and is now cancer free. at age 60 -2018  . Anuerysm Daughter 52  . Diabetes Daughter 34       treaated with oral med    SOCIAL HISTORY: Social History   Socioeconomic History  . Marital status: Widowed    Spouse name: Not on file  . Number of children: 1  . Years of education: 59  . Highest education level: Not on file  Occupational History  . Not on file  Social Needs  . Financial resource strain: Not on file  . Food insecurity    Worry: Not on file    Inability: Not on file  . Transportation needs    Medical: Not on file    Non-medical: Not on file  Tobacco Use  . Smoking status: Never Smoker  . Smokeless tobacco: Never Used  Substance and Sexual Activity  . Alcohol use: Yes    Alcohol/week: 2.0 standard drinks    Types: 2 Standard drinks or equivalent per week    Comment: wine on Friday nights  . Drug  use: No  . Sexual activity: Never    Partners: Male    Birth control/protection: Surgical    Comment: hysterectomy  Lifestyle  . Physical activity    Days per week: Not on file    Minutes per session: Not on file  . Stress: Not on file  Relationships  . Social Herbalist on phone: Not on file    Gets together: Not on file    Attends religious service: Not on file    Active member of club or organization: Not on file  Attends meetings of clubs or organizations: Not on file    Relationship status: Not on file  . Intimate partner violence    Fear of current or ex partner: Not on file    Emotionally abused: Not on file    Physically abused: Not on file    Forced sexual activity: Not on file  Other Topics Concern  . Not on file  Social History Narrative   Lives alone, daughter lives at [place].  Good neighbor support.  Husband passed at age 4 from heart disease and also was diabetic.   Caffeine- coffee, 1 cup daily, maybe occas soda    PHYSICAL EXAM   GENERAL EXAM/CONSTITUTIONAL: Vitals:  Vitals:   08/27/19 1308  BP: 118/74  Pulse: 78  Temp: (!) 97.2 F (36.2 C)  Weight: 115 lb 12.8 oz (52.5 kg)  Height: 5\' 2"  (1.575 m)   Body mass index is 21.18 kg/m. Wt Readings from Last 3 Encounters:  08/27/19 115 lb 12.8 oz (52.5 kg)  12/25/18 118 lb 6.4 oz (53.7 kg)  08/21/18 120 lb 3.2 oz (54.5 kg)    Patient is in no distress; well developed, nourished and groomed; neck is supple  CARDIOVASCULAR:  Examination of carotid arteries is normal; no carotid bruits  Regular rate and rhythm, no murmurs  Examination of peripheral vascular system by observation and palpation is normal  EYES:  Ophthalmoscopic exam of optic discs and posterior segments is normal; no papilledema or hemorrhages No exam data present  MUSCULOSKELETAL:  Gait, strength, tone, movements noted in Neurologic exam below  NEUROLOGIC: MENTAL STATUS:  No flowsheet data found.  awake,  alert, oriented to person, place and time  recent and remote memory intact  normal attention and concentration  language fluent, comprehension intact, naming intact  fund of knowledge appropriate  CRANIAL NERVE:   2nd - no papilledema on fundoscopic exam  2nd, 3rd, 4th, 6th - pupils equal and reactive to light, visual fields full to confrontation, extraocular muscles intact, no nystagmus  5th - facial sensation symmetric  7th - facial strength symmetric  8th - hearing intact  9th - palate elevates symmetrically, uvula midline  11th - shoulder shrug symmetric  12th - tongue protrusion midline  HYPOMIMIA  SOFT VOICE  MOTOR:   NO TREMOR   MILD COGWHEELING RIGIDITY IN RUE  BRADYKINESIA IN RUE >> LUE  BRADYKINESIA RLE > LLU  full strength in the BUE, BLE; EXCEPT DECR RIGHT FOOT DF  SENSORY:   normal and symmetric to light touch  ABSENT VIB AT ANKLES AND TOES  COORDINATION:   finger-nose-finger, fine finger movements SLOW  REFLEXES:   deep tendon reflexes TRACE and symmetric  GAIT/STATION:   DECR RIGHT ARM SWING WITH WALKING  SLIGHTLY STOOPED POSTURE  SMOOTH STRIDE, SHORT STEPS  SMOOTH TURNING     DIAGNOSTIC DATA (LABS, IMAGING, TESTING) - I reviewed patient records, labs, notes, testing and imaging myself where available.  No results found for: WBC, HGB, HCT, MCV, PLT No results found for: NA, K, CL, CO2, GLUCOSE, BUN, CREATININE, CALCIUM, PROT, ALBUMIN, AST, ALT, ALKPHOS, BILITOT, GFRNONAA, GFRAA No results found for: CHOL, HDL, LDLCALC, LDLDIRECT, TRIG, CHOLHDL No results found for: HGBA1C No results found for: VITAMINB12 No results found for: TSH   07/12/18 MRI lumbar spine [I reviewed images myself. -VRP]  - At L4-5: severe spinal stenosis and severe biforaminal stenosis; anterior spondylolisthesis of L4 on L5 (39mm) - At L3-4: moderate spinal stenosis   07/26/18 NM bone scan 1.  Degenerative findings in the lower lumbar spine,  shoulders, base of thumbs, knees, and left first MTP joint. 2. Dextroconvex lower thoracic and lumbar scoliosis. 3. Focal of accentuated activity in the right femur greater trochanter is nonspecific but likely reactive at the gluteal insertion site.  09/08/18 MRI brain [I reviewed images myself and agree with interpretation. -VRP]  1.     Mild generalized cortical atrophy that is moderate in the mesial temporal lobes 2.     Mild chronic microvascular ischemic changes in the hemispheres. 3.     Chronic sphenoid sinusitis. 4.     There are no acute findings.    ASSESSMENT AND PLAN  85 y.o. year old female here with gradual onset progressive resting tremor, cogwheel rigidity, bradykinesia, balance difficulty, hypomania, lack of sense of smell, with signs and symptoms most consistent with idiopathic Parkinson's disease.  Patient also has history of lumbar spinal stenosis with neurogenic claudication, currently being managed conservatively.  Reviewed diagnosis, prognosis and treatment options.  Reviewed community resources with patient.  Dx:  1. Parkinson's disease ([place])   2. Spinal stenosis of lumbar region with neurogenic claudication     PLAN:  PARKINSON'S DISEASE - continue carb/levo 25/100 1 tab three times a day; take 30 minutes before meals - PT evaluation for balance (PD and lumbar spinal stenosis)  LUMBAR SPINAL STENOSIS - conservative mgmt per neurosurgery  Meds ordered this encounter  Medications  . carbidopa-levodopa (SINEMET IR) 25-100 MG tablet    Sig: Take 1 tablet by mouth 3 (three) times daily before meals.    Dispense:  270 tablet    Refill:  4   Orders Placed This Encounter  Procedures  . Ambulatory referral to Physical Therapy   Return in about 8 months (around 04/26/2020).   Penni Bombard, MD [id number], [id number] PM Certified in Neurology, Neurophysiology and Neuroimaging  [facility] [address] [place], [place] [address]  [phone]

## 2019-08-29 ENCOUNTER — Encounter: Payer: Medicare Other | Admitting: Speech Pathology

## 2019-08-29 ENCOUNTER — Ambulatory Visit: Payer: Medicare Other | Admitting: Occupational Therapy

## 2019-08-29 ENCOUNTER — Other Ambulatory Visit: Payer: Self-pay

## 2019-08-29 DIAGNOSIS — R41840 Attention and concentration deficit: Secondary | ICD-10-CM

## 2019-08-29 DIAGNOSIS — R2681 Unsteadiness on feet: Secondary | ICD-10-CM

## 2019-08-29 DIAGNOSIS — R29898 Other symptoms and signs involving the musculoskeletal system: Secondary | ICD-10-CM | POA: Diagnosis not present

## 2019-08-29 DIAGNOSIS — R2689 Other abnormalities of gait and mobility: Secondary | ICD-10-CM | POA: Diagnosis not present

## 2019-08-29 DIAGNOSIS — R29818 Other symptoms and signs involving the nervous system: Secondary | ICD-10-CM | POA: Diagnosis not present

## 2019-08-29 DIAGNOSIS — R278 Other lack of coordination: Secondary | ICD-10-CM | POA: Diagnosis not present

## 2019-08-29 DIAGNOSIS — R293 Abnormal posture: Secondary | ICD-10-CM

## 2019-08-29 NOTE — Therapy (Signed)
[facility] [address] [place], [place], [address] Phone: [phone]   Fax:  [phone]  Occupational Therapy Treatment  Patient Details  Name: Tracy Bernard MRN: [id number] Date of Birth: 1931/09/22 Referring Provider (OT): Dr. Andrey Spearman   Encounter Date: 08/29/2019  OT End of Session - 08/29/19 1156    Visit Number  9    Number of Visits  17    Date for OT Re-Evaluation  09/16/19    Authorization Type  Medicare & [employer] (covered 100%)    Authorization Time Period  cert. 07/18/19-10/15/19    Authorization - Visit Number  9    Authorization - Number of Visits  10    OT Start Time  0930    OT Stop Time  1015    OT Time Calculation (min)  45 min    Activity Tolerance  Patient tolerated treatment well    Behavior During Therapy  WFL for tasks assessed/performed       Past Medical History:  Diagnosis Date  . CKD (chronic kidney disease)    "moderate"  . Hyperlipidemia   . Hypertension   . Spinal stenosis     Past Surgical History:  Procedure Laterality Date  . BREAST BIOPSY Left 1977   benign  . CATARACT EXTRACTION, BILATERAL Bilateral 2014  . CHOLECYSTECTOMY, LAPAROSCOPIC  2001  . TONSILLECTOMY    . TOTAL VAGINAL HYSTERECTOMY  1981   secondary to uterine prolapse    There were no vitals filed for this visit.  Subjective Assessment - 08/29/19 0950    Subjective   The review helped (re: PWR! Supine and standing)    Pertinent History  Parkinson's Disease (dx approx 6 months ago).   PMH:  hyperlipidemia, HTN, Spinal stenosis with chronic back pain, hs of cataract surgery and upcoming eye surgery    Patient Stated Goals  improve coordination/RUE stiffness; prevent future difficulty    Currently in Pain?  No/denies       Issued PD community group info but instructed pt to confirm w/ primary O.T. if group has started back. Pt was very hesitant in going due to not knowing how to get there. Pt was  shown how to use maps app on smartphone and how to enter address and use - pt seemed very timid about using this and recommended pt try it with an already familiar route first.   Pt reported she felt good with PWR! Ex's seated, but would like a review of PWR! Supine - pt performed PWR! Supine basic 4 x 10 reps each for review w/ min cues for all to perform correctly. Also reviewed PWR! Standing basic 4 x 10 reps each. Encouraged pt to also utilize the [employer] if she isn't sure about a specific one                      OT Short Term Goals - 08/29/19 1157      OT SHORT TERM GOAL #1   Title  Pt will be independent with PD-specific HEP.--check STGs 08/17/19    Time  4    Period  Weeks    Status  Achieved      OT SHORT TERM GOAL #2   Title  Pt will verbalize understanding of ways to decr risk of future complications related to PD and appropriate community resources.    Time  4    Period  Weeks    Status  On-going  OT SHORT TERM GOAL #3   Title  Pt will improve R shoulder flexion to at least 130* for functional reaching/ADLs.    Baseline  120* with -30* elbow ext    Time  4    Period  Weeks    Status  Achieved   08/24/19:  135* with -20* elbow ext     OT SHORT TERM GOAL #4   Title  Pt will improve bilateral hand coordination/dressing ability as shown by fastening/unfastening 3 buttons in less than 60sec in at least 2 trials.    Baseline  44min 25sec    Time  4    Period  Weeks    Status  On-going   08/24/19:  58.72sec, 69.93sec     OT SHORT TERM GOAL #5   Title  Pt will improve eating ability as shown by improving time on PPT #2 in 15sec or less without drops.    Baseline  17.75sec with 1 drop    Time  4    Period  Weeks    Status  Achieved   08/24/19:  9.60sec       OT Long Term Goals - 08/24/19 1055      OT LONG TERM GOAL #1   Title  Pt will verbalize understanding of strategies for ADLs/IADLs to incr safety, decr risk for future complications,  incr efficiency/ease (including strategies for picking items off floor, bed mobility, writing, cutting food, grasp, opening jars).--check LTGs 09/16/19    Time  8    Period  Weeks    Status  New      OT LONG TERM GOAL #2   Title  Pt will improve R shoulder flexion to at least 130* with at least -20* elbow ext for functional reaching/ADLs.    Baseline  120* with -30* elbow ext    Time  8    Period  Weeks    Status  Achieved   08/24/19:  135* with -20* elbow ext     OT LONG TERM GOAL #3   Title  Pt will improve dominant R hand coordination for ADLs as shown by improving time on 9-hole peg test by at least 10sec.    Baseline  45.06sec    Time  8    Period  Weeks    Status  New      OT LONG TERM GOAL #4   Title  Pt will improve dominant RUE functional reaching/coordination for ADLs as shown by improving score on box and blocks test by at least 6.    Baseline  39    Time  8    Period  Weeks    Status  New      OT LONG TERM GOAL #5   Title  Pt will improve bilateral hand coordination/dressing ability as shown by fastening/unfastening 3 buttons in less than 45sec in at least 2 trials.    Baseline  67min 25sec    Time  8    Period  Weeks    Status  New      OT LONG TERM GOAL #6   Title  Pt will verbalize understanding of memory compensation strategies and ways to keep thinking skills sharp.    Time  8    Period  Weeks    Status  New      OT LONG TERM GOAL #7   Title  Pt will improve efficiency with dressing as shown by competing PPT#4 in less than 12sec.  Baseline  15.13sec    Time  8    Period  Weeks    Status  New            Plan - 08/29/19 1157    Clinical Impression Statement  Pt progressing with HEP. Pt needs reinforcement to feel comfortable with doing things especially novel tasks (including use of cell phone, cognitive tasks)    Occupational performance deficits (Please refer to evaluation for details):  ADL's;IADL's;Leisure    Body Structure / Function /  Physical Skills  ADL;Dexterity;ROM;Balance;IADL;Improper spinal/pelvic alignment;Mobility;Flexibility;Coordination;FMC;Tone;UE functional use;Decreased knowledge of use of [place]    Cognitive Skills  Memory;Attention;Thought    OT Frequency  2x / week    OT Duration  8 weeks    OT Treatment/Interventions  Self-care/ADL training;Moist Heat;Fluidtherapy;DME and/or AE instruction;Splinting;Balance training;Therapeutic activities;Therapeutic exercise;Cognitive remediation/compensation;Passive range of motion;Functional Mobility Training;Neuromuscular education;Cryotherapy;Energy conservation;Manual Therapy;Patient/family education    Plan  10th progress note, education in community resources and assess STG #2 and reassess STG #4, functional reaching w/ large amplitude movements    Consulted and Agree with Plan of Care  Patient       Patient will benefit from skilled therapeutic intervention in order to improve the following deficits and impairments:   Body Structure / Function / Physical Skills: ADL, Dexterity, ROM, Balance, IADL, Improper spinal/pelvic alignment, Mobility, Flexibility, Coordination, FMC, Tone, UE functional use, Decreased knowledge of use of DME, GMC Cognitive Skills: Memory, Attention, Thought     Visit Diagnosis: Other symptoms and signs involving the nervous system  Other symptoms and signs involving the musculoskeletal system  Unsteadiness on feet  Attention and concentration deficit  Abnormal posture    Problem List Patient Active Problem List   Diagnosis Date Noted  . Vitamin D deficiency 12/17/2014  . Atrophic vaginitis 12/17/2014  . HTN (hypertension) 09/10/2013  . Hypercholesterolemia 09/10/2013  . Postmenopausal HRT (hormone replacement therapy) 09/10/2013    Carey Bullocks, OTR/L 08/29/2019, 12:00 PM  [facility] [address] [place], [place], [address] Phone: [phone]   Fax:   [phone]  Name: HANAN SAJDAK MRN: [id number] Date of Birth: 02/11/1931

## 2019-08-31 ENCOUNTER — Encounter: Payer: Self-pay | Admitting: Occupational Therapy

## 2019-08-31 ENCOUNTER — Ambulatory Visit: Payer: Medicare Other

## 2019-08-31 ENCOUNTER — Ambulatory Visit: Payer: Medicare Other | Admitting: Occupational Therapy

## 2019-08-31 ENCOUNTER — Other Ambulatory Visit: Payer: Self-pay

## 2019-08-31 DIAGNOSIS — R278 Other lack of coordination: Secondary | ICD-10-CM

## 2019-08-31 DIAGNOSIS — R2681 Unsteadiness on feet: Secondary | ICD-10-CM

## 2019-08-31 DIAGNOSIS — R29818 Other symptoms and signs involving the nervous system: Secondary | ICD-10-CM

## 2019-08-31 DIAGNOSIS — R41840 Attention and concentration deficit: Secondary | ICD-10-CM

## 2019-08-31 DIAGNOSIS — R2689 Other abnormalities of gait and mobility: Secondary | ICD-10-CM | POA: Diagnosis not present

## 2019-08-31 DIAGNOSIS — R29898 Other symptoms and signs involving the musculoskeletal system: Secondary | ICD-10-CM | POA: Diagnosis not present

## 2019-08-31 DIAGNOSIS — R471 Dysarthria and anarthria: Secondary | ICD-10-CM

## 2019-08-31 DIAGNOSIS — R293 Abnormal posture: Secondary | ICD-10-CM

## 2019-08-31 NOTE — Therapy (Signed)
[facility] [address] [place], [place], [address] Phone: [phone]   Fax:  [phone]  Speech Language Pathology Treatment/Discharge Summary  Patient Details  Name: Tracy Bernard MRN: [id number] Date of Birth: [birth year] Referring Provider (SLP): Andrey Spearman, MD   Encounter Date: 08/31/2019  End of Session - 08/31/19 1235    Visit Number  7    Number of Visits  17    Date for SLP Re-Evaluation  10/15/19    SLP Start Time  1147    SLP Stop Time   1222    SLP Time Calculation (min)  35 min    Activity Tolerance  Patient tolerated treatment well       Past Medical History:  Diagnosis Date  . CKD (chronic kidney disease)    "moderate"  . Hyperlipidemia   . Hypertension   . Spinal stenosis     Past Surgical History:  Procedure Laterality Date  . BREAST BIOPSY Left 1977   benign  . CATARACT EXTRACTION, BILATERAL Bilateral 2014  . CHOLECYSTECTOMY, LAPAROSCOPIC  2001  . TONSILLECTOMY    . TOTAL VAGINAL HYSTERECTOMY  1981   secondary to uterine prolapse    There were no vitals filed for this visit.  Subjective Assessment - 08/31/19 1156    Subjective  "My friends think I still sound good."    Currently in Pain?  No/denies            ADULT SLP TREATMENT - 08/31/19 1156      General Information   Behavior/Cognition  Alert;Cooperative;Pleasant mood      Treatment Provided   Treatment provided  Cognitive-Linquistic      Cognitive-Linquistic Treatment   Treatment focused on  Dysarthria    Skilled Treatment  Initial greeting today 5 minutes was WNL volume. SLP had pt complete loud /a/ (instead of "hey -ah") average upper 80s- lower 90s dB. In conversation outdoors pt maintained WNL volume for 18 minutes conversation. Pt again remarks she is very pleased with the outcome of ST, saying her friends can now hear her in conversation .      Assessment / Recommendations / Plan   Plan  Discharge  SLP treatment due to (comment)      Progression Toward Goals   Progression toward goals  --   see goal update - d/c day        SLP Short Term Goals - 08/31/19 1219      SLP SHORT TERM GOAL #1   Title  pt will produce simple conversation of 10 minutes at WNL volume in 3 sessions    Status  Partially Met      SLP [place] #2   Title  pt will demo WNL loudness in question and answer tasks x 2 sessions    Status  Deferred   pt progressed too quickly     SLP SHORT TERM GOAL #3   Title  pt will demo loud /a/  or "hey - ahhh" with volume in low-mid 80s dB average in 3 sessions    Baseline  08-03-19, 08-07-19, 08-10-19    Status  Achieved       SLP Long Term Goals - 08/31/19 1220      SLP LONG TERM GOAL #1   Title  pt will demo loud /a/ with volume in low-mid 80s dB average in 8 sessions    Baseline  (3 sessions prior) 08-17-19, 08-31-19    Status  Partially Met  SLP LONG TERM GOAL #2   Title  pt will engage in 15 minutes mod complex/complex conversation with WNL volumes in 4 sessions    Baseline  08-17-19, 08-31-19    Status  Partially Met      SLP LONG TERM GOAL #3   Title  pt will tell SLP 2 signs of dysphagia with modified indpendence over two sessions    Baseline  08-31-19    Status  Partially Met       Plan - 08/31/19 1235    Clinical Impression Statement  Pt presents today with improved speech loudness over approx 2 weeks without ST. Pt produced conversation at [facility] levels upon entry to [facility] room - for total 25 minutes including while outdoors.  At the time she will be d/c'd from skilled ST.    Treatment/Interventions  Patient/family education;Compensatory strategies;SLP instruction and feedback;Functional tasks    Potential to Achieve Goals  Good    SLP Home Exercise Plan  provided    Consulted and Agree with Plan of Care  Patient       Patient will benefit from skilled therapeutic intervention in order to improve the following deficits and impairments:    Dysarthria and anarthria   SPEECH THERAPY DISCHARGE SUMMARY  Visits from Start of Care: 7  Current functional level related to goals / functional outcomes: Pt with excellent success in only 6-7 sessions. She maintained WNL loudness in 20+ minutes conversation in her last few ST sessions. She was educated re: overt s/sx dysphagia in the last session and told SLP a few of these signs.   Remaining deficits: None.   Education / Equipment: Overt s/s dysphgia/aspiration during meals, need for loud /a/ after d/c.   Plan: Patient agrees to discharge.  Patient goals were partially met. Patient is being discharged due to meeting the stated rehab goals.  ?????Pt should be screened for deficits again in approx 6-8 months.        Problem List Patient Active Problem List   Diagnosis Date Noted  . Vitamin D deficiency 12/17/2014  . Atrophic vaginitis 12/17/2014  . HTN (hypertension) 09/10/2013  . Hypercholesterolemia 09/10/2013  . Postmenopausal HRT (hormone replacement therapy) 09/10/2013    [facility] 08/31/2019, 12:40 PM  [facility] [address] [place], [place], [address] Phone: [phone]   Fax:  [phone]   Name: Tracy Bernard MRN: [id number] Date of Birth: June 03, 1931

## 2019-08-31 NOTE — Therapy (Signed)
[facility] [address] [place], [place], [address] Phone: [phone]   Fax:  [phone]  Occupational Therapy Treatment  Patient Details  Name: Tracy Bernard MRN: [id number] Date of Birth: 14-Apr-1931 Referring Provider (OT): Dr. Andrey Spearman   Encounter Date: 08/31/2019  OT End of Session - 08/31/19 1403    Visit Number  10    Number of Visits  17    Date for OT Re-Evaluation  09/16/19    Authorization Type  Medicare & [employer]ARP (covered 100%)    Authorization Time Period  cert. 07/18/19-10/15/19.  Week 6/8.    Authorization - Visit Number  10    Authorization - Number of Visits  10    OT Start Time  1108    OT Stop Time  1150    OT Time Calculation (min)  42 min    Activity Tolerance  Patient tolerated treatment well    Behavior During Therapy  WFL for tasks assessed/performed       Past Medical History:  Diagnosis Date  . CKD (chronic kidney disease)    "moderate"  . Hyperlipidemia   . Hypertension   . Spinal stenosis     Past Surgical History:  Procedure Laterality Date  . BREAST BIOPSY Left 1977   benign  . CATARACT EXTRACTION, BILATERAL Bilateral 2014  . CHOLECYSTECTOMY, LAPAROSCOPIC  2001  . TONSILLECTOMY    . TOTAL VAGINAL HYSTERECTOMY  1981   secondary to uterine prolapse    There were no vitals filed for this visit.  Subjective Assessment - 08/31/19 1109    Subjective   Pt reports that the exercise chart/schedule is helping and that she is trying to make an effort to move bigger with reaching.    Pertinent History  Parkinson's Disease (dx approx 6 months ago).   PMH:  hyperlipidemia, HTN, Spinal stenosis with chronic back pain, hs of cataract surgery and upcoming eye surgery    Patient Stated Goals  improve coordination/RUE stiffness; prevent future difficulty    Currently in Pain?  No/denies         In standing/sitting, practiced PWR! Twist with emphasis on turning at the hips  particularly on R side due to back discomfort.  Pt with no pain/discomfort once cued for feet apart more and turning at hips.  Pt returned demo with min cueing.  Functional step and reach to each side/with each UE with min cueing for PWR! Hands, R elbow ext, and coordination of step/reach and positioning/large amplitude for improved balance and decr back pain/discomfort.  Arm bike x81mn level 1 for reciprocal movement with min cues intermittently/target of at least 30rpms for intensity while maintaining movement amplitude/reciprocal movement.   Pt maintained 28-32rpms          OT Education - 08/31/19 1448    Education Details  PD [employer] Web-based resources;  Benefits and use of aerobic exercise/stationary bike with PD    Person(s) Educated  Patient    Methods  Explanation;Demonstration;Handout    Comprehension  Verbalized understanding       OT Short Term Goals - 08/31/19 1449      OT SHORT TERM GOAL #1   Title  Pt will be independent with PD-specific HEP.--check STGs 08/17/19    Time  4    Period  Weeks    Status  Achieved      OT SHORT TERM GOAL #2   Title  Pt will verbalize understanding of ways to decr  risk of future complications related to PD and appropriate community resources.    Time  4    Period  Weeks    Status  On-going   08/31/19:   met with community resources, needs futher education regarding future complications     OT SHORT TERM GOAL #3   Title  Pt will improve R shoulder flexion to at least 130* for functional reaching/ADLs.    Baseline  120* with -30* elbow ext    Time  4    Period  Weeks    Status  Achieved   08/24/19:  135* with -20* elbow ext     OT SHORT TERM GOAL #4   Title  Pt will improve bilateral hand coordination/dressing ability as shown by fastening/unfastening 3 buttons in less than 60sec in at least 2 trials.    Baseline  69mn 25sec    Time  4    Period  Weeks    Status  On-going   08/24/19:  58.72sec, 69.93sec      OT SHORT TERM GOAL #5   Title  Pt will improve eating ability as shown by improving time on PPT #2 in 15sec or less without drops.    Baseline  17.75sec with 1 drop    Time  4    Period  Weeks    Status  Achieved   08/24/19:  9.60sec       OT Long Term Goals - 08/24/19 1055      OT LONG TERM GOAL #1   Title  Pt will verbalize understanding of strategies for ADLs/IADLs to incr safety, decr risk for future complications, incr efficiency/ease (including strategies for picking items off floor, bed mobility, writing, cutting food, grasp, opening jars).--check LTGs 09/16/19    Time  8    Period  Weeks    Status  New      OT LONG TERM GOAL #2   Title  Pt will improve R shoulder flexion to at least 130* with at least -20* elbow ext for functional reaching/ADLs.    Baseline  120* with -30* elbow ext    Time  8    Period  Weeks    Status  Achieved   08/24/19:  135* with -20* elbow ext     OT LONG TERM GOAL #3   Title  Pt will improve dominant R hand coordination for ADLs as shown by improving time on 9-hole peg test by at least 10sec.    Baseline  45.06sec    Time  8    Period  Weeks    Status  New      OT LONG TERM GOAL #4   Title  Pt will improve dominant RUE functional reaching/coordination for ADLs as shown by improving score on box and blocks test by at least 6.    Baseline  39    Time  8    Period  Weeks    Status  New      OT LONG TERM GOAL #5   Title  Pt will improve bilateral hand coordination/dressing ability as shown by fastening/unfastening 3 buttons in less than 45sec in at least 2 trials.    Baseline  213m 25sec    Time  8    Period  Weeks    Status  New      OT LONG TERM GOAL #6   Title  Pt will verbalize understanding of memory compensation strategies and ways to keep thinking skills sharp.  Time  8    Period  Weeks    Status  New      OT LONG TERM GOAL #7   Title  Pt will improve efficiency with dressing as shown by competing PPT#4 in less than 12sec.     Baseline  15.13sec    Time  8    Period  Weeks    Status  New            Plan - 08/31/19 1403    Clinical Impression Statement  Reporting Period 07/18/19-08/31/19:  Pt is progressing towards goals with improving ADL performance and coordination.  Pt demo incr awareness of movement amplitude to decr risk of future complications, but would benefit from further occupational therapy to incr carryover and reinforce movement amplitude for large amplitude movement strategies for calibration and to complete pt education.    Occupational performance deficits (Please refer to evaluation for details):  ADL's;IADL's;Leisure    Body Structure / Function / Physical Skills  ADL;Dexterity;ROM;Balance;IADL;Improper spinal/pelvic alignment;Mobility;Flexibility;Coordination;FMC;Tone;UE functional use;Decreased knowledge of use of [place]    Cognitive Skills  Memory;Attention;Thought    OT Frequency  2x / week    OT Duration  8 weeks    OT Treatment/Interventions  Self-care/ADL training;Moist Heat;Fluidtherapy;DME and/or AE instruction;Splinting;Balance training;Therapeutic activities;Therapeutic exercise;Cognitive remediation/compensation;Passive range of motion;Functional Mobility Training;Neuromuscular education;Cryotherapy;Energy conservation;Manual Therapy;Patient/family education    Plan  continue to address ADLs/functional movements with large amplitude, provide ways to prevent future complications handout    Consulted and Agree with Plan of Care  Patient       Patient will benefit from skilled therapeutic intervention in order to improve the following deficits and impairments:   Body Structure / Function / Physical Skills: ADL, Dexterity, ROM, Balance, IADL, Improper spinal/pelvic alignment, Mobility, Flexibility, Coordination, FMC, Tone, UE functional use, Decreased knowledge of use of DME, GMC Cognitive Skills: Memory, Attention, Thought     Visit Diagnosis: Other symptoms and signs involving  the nervous system  Other symptoms and signs involving the musculoskeletal system  Unsteadiness on feet  Attention and concentration deficit  Abnormal posture  Other lack of coordination  Other abnormalities of gait and mobility    Problem List Patient Active Problem List   Diagnosis Date Noted  . Vitamin D deficiency 12/17/2014  . Atrophic vaginitis 12/17/2014  . HTN (hypertension) 09/10/2013  . Hypercholesterolemia 09/10/2013  . Postmenopausal HRT (hormone replacement therapy) 09/10/2013    [facility] 08/31/2019, 2:58 PM  [facility] [address] [place], [place], [address] Phone: [phone]   Fax:  [phone]  Name: KHAILA VELARDE MRN: [id number] Date of Birth: 16-Jul-1931   Vianne Bulls, OTR/L [facility] [address] [place], [place]  [address] [phone] phone [phone] 08/31/19 2:58 PM

## 2019-09-05 ENCOUNTER — Encounter: Payer: Medicare Other | Admitting: Speech Pathology

## 2019-09-05 ENCOUNTER — Ambulatory Visit: Payer: Medicare Other | Admitting: Occupational Therapy

## 2019-09-05 ENCOUNTER — Encounter: Payer: Self-pay | Admitting: Occupational Therapy

## 2019-09-05 ENCOUNTER — Other Ambulatory Visit: Payer: Self-pay

## 2019-09-05 DIAGNOSIS — R41840 Attention and concentration deficit: Secondary | ICD-10-CM

## 2019-09-05 DIAGNOSIS — R293 Abnormal posture: Secondary | ICD-10-CM

## 2019-09-05 DIAGNOSIS — R2681 Unsteadiness on feet: Secondary | ICD-10-CM | POA: Diagnosis not present

## 2019-09-05 DIAGNOSIS — R278 Other lack of coordination: Secondary | ICD-10-CM | POA: Diagnosis not present

## 2019-09-05 DIAGNOSIS — R29818 Other symptoms and signs involving the nervous system: Secondary | ICD-10-CM | POA: Diagnosis not present

## 2019-09-05 DIAGNOSIS — R2689 Other abnormalities of gait and mobility: Secondary | ICD-10-CM

## 2019-09-05 DIAGNOSIS — R29898 Other symptoms and signs involving the musculoskeletal system: Secondary | ICD-10-CM | POA: Diagnosis not present

## 2019-09-05 NOTE — Therapy (Signed)
[facility] [address] [place], [place], [address] Phone: [phone]   Fax:  [phone]  Occupational Therapy Treatment  Patient Details  Name: Tracy Bernard MRN: [id number] Date of Birth: [birth year] Referring Provider (OT): Dr. Andrey Spearman   Encounter Date: 09/05/2019  OT End of Session - 09/05/19 1030    Visit Number  11    Number of Visits  17    Date for OT Re-Evaluation  09/16/19    Authorization Type  Medicare & AARP (covered 100%)    Authorization Time Period  cert. 07/18/19-10/15/19.  Week 6/8.    Authorization - Visit Number  11    Authorization - Number of Visits  20    OT Start Time  6384    OT Stop Time  1102    OT Time Calculation (min)  39 min    Activity Tolerance  Patient tolerated treatment well    Behavior During Therapy  WFL for tasks assessed/performed       Past Medical History:  Diagnosis Date  . CKD (chronic kidney disease)    "moderate"  . Hyperlipidemia   . Hypertension   . Spinal stenosis     Past Surgical History:  Procedure Laterality Date  . BREAST BIOPSY Left 1977   benign  . CATARACT EXTRACTION, BILATERAL Bilateral 2014  . CHOLECYSTECTOMY, LAPAROSCOPIC  2001  . TONSILLECTOMY    . TOTAL VAGINAL HYSTERECTOMY  1981   secondary to uterine prolapse    There were no vitals filed for this visit.  Subjective Assessment - 09/05/19 1027    Subjective   hasn't slept well last few days, can't get comfortable.    Pertinent History  Parkinson's Disease (dx approx 6 months ago).   PMH:  hyperlipidemia, HTN, Spinal stenosis with chronic back pain, hs of cataract surgery and upcoming eye surgery    Patient Stated Goals  improve coordination/RUE stiffness; prevent future difficulty    Currently in Pain?  Yes    Pain Score  3     Pain Location  Back    Pain Descriptors / Indicators  Aching;Sore    Pain Type  Chronic pain    Pain Onset  More than a month ago    Aggravating  Factors   standing, moveing too much    Pain Relieving Factors  rest          PWR! Moves (basic 4) in sitting, supine, standing x 10-20 each with min cues For incr movement amplitude and for technique for incr rotation at hips to decr stress on back for PWR! Twist, pt to use visual target for improvedhead position for PWR! up.  Updated notes on pt's handouts.  Supine>sitting with min cues for use of large amplitude movement strategy to incr ease and decr stress on back to prevent pain.  Pt verbalized understanding and returned demo with cueing.  Functional reaching to place large pegs in vertical pegboard in sitting with set-up for large amplitude reaching overhead and laterally for wt. Shift.  Min cueing for PWR! Hands and elbow ext RUE.   Reviewed large amplitude movement strategies with reach.      OT Short Term Goals - 08/31/19 1449      OT SHORT TERM GOAL #1   Title  Pt will be independent with PD-specific HEP.--check STGs 08/17/19    Time  4    Period  Weeks    Status  Achieved      OT SHORT  TERM GOAL #2   Title  Pt will verbalize understanding of ways to decr risk of future complications related to PD and appropriate community resources.    Time  4    Period  Weeks    Status  On-going   08/31/19:   met with community resources, needs futher education regarding future complications     OT SHORT TERM GOAL #3   Title  Pt will improve R shoulder flexion to at least 130* for functional reaching/ADLs.    Baseline  120* with -30* elbow ext    Time  4    Period  Weeks    Status  Achieved   08/24/19:  135* with -20* elbow ext     OT SHORT TERM GOAL #4   Title  Pt will improve bilateral hand coordination/dressing ability as shown by fastening/unfastening 3 buttons in less than 60sec in at least 2 trials.    Baseline  93mn 25sec    Time  4    Period  Weeks    Status  On-going   08/24/19:  58.72sec, 69.93sec     OT SHORT TERM GOAL #5   Title  Pt will improve eating  ability as shown by improving time on PPT #2 in 15sec or less without drops.    Baseline  17.75sec with 1 drop    Time  4    Period  Weeks    Status  Achieved   08/24/19:  9.60sec       OT Long Term Goals - 08/24/19 1055      OT LONG TERM GOAL #1   Title  Pt will verbalize understanding of strategies for ADLs/IADLs to incr safety, decr risk for future complications, incr efficiency/ease (including strategies for picking items off floor, bed mobility, writing, cutting food, grasp, opening jars).--check LTGs 09/16/19    Time  8    Period  Weeks    Status  New      OT LONG TERM GOAL #2   Title  Pt will improve R shoulder flexion to at least 130* with at least -20* elbow ext for functional reaching/ADLs.    Baseline  120* with -30* elbow ext    Time  8    Period  Weeks    Status  Achieved   08/24/19:  135* with -20* elbow ext     OT LONG TERM GOAL #3   Title  Pt will improve dominant R hand coordination for ADLs as shown by improving time on 9-hole peg test by at least 10sec.    Baseline  45.06sec    Time  8    Period  Weeks    Status  New      OT LONG TERM GOAL #4   Title  Pt will improve dominant RUE functional reaching/coordination for ADLs as shown by improving score on box and blocks test by at least 6.    Baseline  39    Time  8    Period  Weeks    Status  New      OT LONG TERM GOAL #5   Title  Pt will improve bilateral hand coordination/dressing ability as shown by fastening/unfastening 3 buttons in less than 45sec in at least 2 trials.    Baseline  234m 25sec    Time  8    Period  Weeks    Status  New      OT LONG TERM GOAL #6   Title  Pt will  verbalize understanding of memory compensation strategies and ways to keep thinking skills sharp.    Time  8    Period  Weeks    Status  New      OT LONG TERM GOAL #7   Title  Pt will improve efficiency with dressing as shown by competing PPT#4 in less than 12sec.    Baseline  15.13sec    Time  8    Period  Weeks     Status  New            Plan - 09/05/19 1031    Clinical Impression Statement  Pt continues to progress towards goals.  Pt demo improved performance with PWR! moves and decr discomfort with back with modifications today.    Occupational performance deficits (Please refer to evaluation for details):  ADL's;IADL's;Leisure    Body Structure / Function / Physical Skills  ADL;Dexterity;ROM;Balance;IADL;Improper spinal/pelvic alignment;Mobility;Flexibility;Coordination;FMC;Tone;UE functional use;Decreased knowledge of use of [place]    Cognitive Skills  Memory;Attention;Thought    OT Frequency  2x / week    OT Duration  8 weeks    OT Treatment/Interventions  Self-care/ADL training;Moist Heat;Fluidtherapy;DME and/or AE instruction;Splinting;Balance training;Therapeutic activities;Therapeutic exercise;Cognitive remediation/compensation;Passive range of motion;Functional Mobility Training;Neuromuscular education;Cryotherapy;Energy conservation;Manual Therapy;Patient/family education    Plan  continue to address ADLs/functional movements with large amplitude, begin checking remaining goals, handout for preventing future complications    Consulted and Agree with Plan of Care  Patient       Patient will benefit from skilled therapeutic intervention in order to improve the following deficits and impairments:   Body Structure / Function / Physical Skills: ADL, Dexterity, ROM, Balance, IADL, Improper spinal/pelvic alignment, Mobility, Flexibility, Coordination, FMC, Tone, UE functional use, Decreased knowledge of use of DME, GMC Cognitive Skills: Memory, Attention, Thought     Visit Diagnosis: Other symptoms and signs involving the musculoskeletal system  Other symptoms and signs involving the nervous system  Unsteadiness on feet  Attention and concentration deficit  Decreased coordination  Abnormal posture  Other lack of coordination  Other abnormalities of gait and  mobility    Problem List Patient Active Problem List   Diagnosis Date Noted  . Vitamin D deficiency 12/17/2014  . Atrophic vaginitis 12/17/2014  . HTN (hypertension) 09/10/2013  . Hypercholesterolemia 09/10/2013  . Postmenopausal HRT (hormone replacement therapy) 09/10/2013    [facility] 09/05/2019, 3:01 PM  [facility] [address] [place], [place], [address] Phone: [phone]   Fax:  [phone]  Name: Tracy Bernard MRN: [id number] Date of Birth: [birth year]   Vianne Bulls, OTR/L [facility] [address] [place], [place]  [address] [phone] phone [phone] 09/05/19 3:01 PM

## 2019-09-12 ENCOUNTER — Encounter: Payer: Self-pay | Admitting: Occupational Therapy

## 2019-09-12 ENCOUNTER — Ambulatory Visit: Payer: Medicare Other | Attending: Diagnostic Neuroimaging | Admitting: Occupational Therapy

## 2019-09-12 ENCOUNTER — Other Ambulatory Visit: Payer: Self-pay

## 2019-09-12 DIAGNOSIS — R278 Other lack of coordination: Secondary | ICD-10-CM | POA: Insufficient documentation

## 2019-09-12 DIAGNOSIS — R29898 Other symptoms and signs involving the musculoskeletal system: Secondary | ICD-10-CM

## 2019-09-12 DIAGNOSIS — R2681 Unsteadiness on feet: Secondary | ICD-10-CM | POA: Insufficient documentation

## 2019-09-12 DIAGNOSIS — R293 Abnormal posture: Secondary | ICD-10-CM | POA: Insufficient documentation

## 2019-09-12 DIAGNOSIS — R29818 Other symptoms and signs involving the nervous system: Secondary | ICD-10-CM | POA: Diagnosis not present

## 2019-09-12 DIAGNOSIS — R2689 Other abnormalities of gait and mobility: Secondary | ICD-10-CM | POA: Insufficient documentation

## 2019-09-12 DIAGNOSIS — R41840 Attention and concentration deficit: Secondary | ICD-10-CM | POA: Insufficient documentation

## 2019-09-12 NOTE — Patient Instructions (Signed)
   Ways to prevent future Parkinson's related complications:  1.   Exercise regularly.  Perform your therapy exercises and incorporate safe aerobic exercise when possible (swimming, stationary bike, arm bike, seated stepper)  2.   Focus on BIGGER movements during daily activities- really reach overhead, straighten elbows and extend fingers  3.   When dressing or reaching for your seatbelt make sure to use your body to assist by twisting and looking at where you are reaching while you reach--this can help to minimize stress on the shoulder and reduce the risk of a rotator cuff tear  4.   Anytime you reach or move shoulder, make sure you have good upright posture.  5.  When you reach for something overhead, make sure your thumb is facing up.  This is a better position for your shoulder.  6.  Swing your arms when you walk!  People with PD are at increased risk for frozen shoulder and swinging your arms can reduce this risk.  7.  Keep you feet apart when you are standing to allow you to have better balance and reach further (which can help with shoulder rigidity).  Also make sure your feet are apart when you are sitting before you stand up.

## 2019-09-12 NOTE — Therapy (Signed)
[facility] [address] [place], [place], [address] Phone: [phone]   Fax:  [phone]  Occupational Therapy Treatment  Patient Details  Name: Tracy Bernard MRN: [id number] Date of Birth: 1931-09-12 Referring Provider (OT): Dr. Andrey Spearman   Encounter Date: 09/12/2019  OT End of Session - 09/12/19 0853    Visit Number  12    Number of Visits  17    Date for OT Re-Evaluation  10/05/19    Authorization Type  Medicare & [employer] (covered 100%)    Authorization Time Period  cert. 07/18/19-10/15/19.    Authorization - Visit Number  12    Authorization - Number of Visits  20    OT Start Time  [id number]    OT Stop Time  0931    OT Time Calculation (min)  38 min    Activity Tolerance  Patient tolerated treatment well    Behavior During Therapy  [facility] for tasks assessed/performed       Past Medical History:  Diagnosis Date  . CKD (chronic kidney disease)    "moderate"  . Hyperlipidemia   . Hypertension   . Spinal stenosis     Past Surgical History:  Procedure Laterality Date  . BREAST BIOPSY Left 1977   benign  . CATARACT EXTRACTION, BILATERAL Bilateral 2014  . CHOLECYSTECTOMY, LAPAROSCOPIC  2001  . TONSILLECTOMY    . TOTAL VAGINAL HYSTERECTOMY  1981   secondary to uterine prolapse    There were no vitals filed for this visit.  Subjective Assessment - 09/12/19 0853    Subjective   pt reports that she is please with progress.  Begins PT soon.    Pertinent History  Parkinson's Disease (dx approx 6 months ago).   PMH:  hyperlipidemia, HTN, Spinal stenosis with chronic back pain, hs of cataract surgery and upcoming eye surgery    Patient Stated Goals  improve coordination/RUE stiffness; prevent future difficulty    Currently in Pain?  No/denies    Pain Onset  More than a month ago        Standing, tossing 2 scarves alternating UEs with forward/backward wt. Shift for incr arm swing and PWR! Reach with min cues.    Began checking progress towards goals--see goals section below and discussed progress.   PWR! Moves (basic 4) in standing x 10-20 each with min cues For incr movement amplitude (primarily head position and RUE).  Began education regarding benefits of cognitive challenges for people with PD.  Pt verbalized understanding.        OT Education - 09/12/19 1455    Education Details  Ways to reduce risk of future complications related to PD    Person(s) Educated  Patient    Methods  Explanation;Handout;Demonstration;Verbal cues    Comprehension  Verbalized understanding       OT Short Term Goals - 09/12/19 0912      OT SHORT TERM GOAL #1   Title  Pt will be independent with PD-specific HEP.--check STGs 08/17/19    Time  4    Period  Weeks    Status  Achieved      OT SHORT TERM GOAL #2   Title  Pt will verbalize understanding of ways to decr risk of future complications related to PD and appropriate community resources.    Time  4    Period  Weeks    Status  Achieved   08/31/19:   met with community resources, needs futher education regarding  future complications.  09/12/19 met     OT SHORT TERM GOAL #3   Title  Pt will improve R shoulder flexion to at least 130* for functional reaching/ADLs.    Baseline  120* with -30* elbow ext    Time  4    Period  Weeks    Status  Achieved   08/24/19:  135* with -20* elbow ext     OT SHORT TERM GOAL #4   Title  Pt will improve bilateral hand coordination/dressing ability as shown by fastening/unfastening 3 buttons in less than 60sec in at least 2 trials.    Baseline  48mn 25sec    Time  4    Period  Weeks    Status  Achieved   08/24/19:  58.72sec, 69.93sec.  09/12/19:  met     OT SHORT TERM GOAL #5   Title  Pt will improve eating ability as shown by improving time on PPT #2 in 15sec or less without drops.    Baseline  17.75sec with 1 drop    Time  4    Period  Weeks    Status  Achieved   08/24/19:  9.60sec       OT Long  Term Goals - 09/12/19 0901      OT LONG TERM GOAL #1   Title  Pt will verbalize understanding of strategies for ADLs/IADLs to incr safety, decr risk for future complications, incr efficiency/ease (including strategies for picking items off floor, bed mobility, writing, cutting food, grasp, opening jars).--check LTGs 10/06/19    Time  8    Period  Weeks    Status  New      OT LONG TERM GOAL #2   Title  Pt will improve R shoulder flexion to at least 130* with at least -20* elbow ext for functional reaching/ADLs.    Baseline  120* with -30* elbow ext    Time  8    Period  Weeks    Status  Achieved   08/24/19:  135* with -20* elbow ext     OT LONG TERM GOAL #3   Title  Pt will improve dominant R hand coordination for ADLs as shown by improving time on 9-hole peg test by at least 10sec.    Baseline  45.06sec    Time  8    Period  Weeks    Status  Achieved   09/12/19:  33.97sec     OT LONG TERM GOAL #4   Title  Pt will improve dominant RUE functional reaching/coordination for ADLs as shown by improving score on box and blocks test by at least 6.    Baseline  39    Time  8    Period  Weeks    Status  Achieved   09/12/19: 45 blocks     OT LONG TERM GOAL #5   Title  Pt will improve bilateral hand coordination/dressing ability as shown by fastening/unfastening 3 buttons in less than 45sec in at least 2 trials.    Baseline  222m 25sec    Time  8    Period  Weeks    Status  Achieved   09/12/19:  45.43, 36.41sec     OT LONG TERM GOAL #6   Title  Pt will verbalize understanding of memory compensation strategies and ways to keep thinking skills sharp.    Time  8    Period  Weeks    Status  New      OT  LONG TERM GOAL #7   Title  Pt will improve efficiency with dressing as shown by competing PPT#4 in less than 12sec.    Baseline  15.13sec    Time  8    Period  Weeks    Status  On-going   09/12/19: 15.22, 10.37sec           Plan - 09/12/19 0854    Clinical Impression Statement   Pt continues to make good progress with improved coordination, improved efficiency for ADLs (see goals section).  Pt would benefit from additional OT to complete pt education/progress HEP to include cognitive aspects and address remaining goals  (pt not been seen frequency due to schedule conflicts)    Occupational performance deficits (Please refer to evaluation for details):  ADL's;IADL's;Leisure    Body Structure / Function / Physical Skills  ADL;Dexterity;ROM;Balance;IADL;Improper spinal/pelvic alignment;Mobility;Flexibility;Coordination;FMC;Tone;UE functional use;Decreased knowledge of use of [place]    Cognitive Skills  Memory;Attention;Thought    OT Frequency  2x / week    OT Duration  12 weeks   17 visits over 12 weeks   OT Treatment/Interventions  Self-care/ADL training;Moist Heat;Fluidtherapy;DME and/or AE instruction;Splinting;Balance training;Therapeutic activities;Therapeutic exercise;Cognitive remediation/compensation;Passive range of motion;Functional Mobility Training;Neuromuscular education;Cryotherapy;Energy conservation;Manual Therapy;Patient/family education    Plan  ways to keep thinking skills sharp, ?possible d/c in next 2 visits    Consulted and Agree with Plan of Care  Patient       Patient will benefit from skilled therapeutic intervention in order to improve the following deficits and impairments:   Body Structure / Function / Physical Skills: ADL, Dexterity, ROM, Balance, IADL, Improper spinal/pelvic alignment, Mobility, Flexibility, Coordination, FMC, Tone, UE functional use, Decreased knowledge of use of DME, GMC Cognitive Skills: Memory, Attention, Thought     Visit Diagnosis: Other symptoms and signs involving the musculoskeletal system - Plan: Ot plan of care cert/re-cert  Other symptoms and signs involving the nervous system - Plan: Ot plan of care cert/re-cert  Unsteadiness on feet - Plan: Ot plan of care cert/re-cert  Attention and concentration deficit  - Plan: Ot plan of care cert/re-cert  Decreased coordination - Plan: Ot plan of care cert/re-cert  Abnormal posture - Plan: Ot plan of care cert/re-cert  Other lack of coordination - Plan: Ot plan of care cert/re-cert  Other abnormalities of gait and mobility - Plan: Ot plan of care cert/re-cert    Problem List Patient Active Problem List   Diagnosis Date Noted  . Vitamin D deficiency 12/17/2014  . Atrophic vaginitis 12/17/2014  . HTN (hypertension) 09/10/2013  . Hypercholesterolemia 09/10/2013  . Postmenopausal HRT (hormone replacement therapy) 09/10/2013    [facility] 09/12/2019, 3:16 PM  [facility] [address] [place], [place], [address] Phone: [phone]   Fax:  [phone]  Name: Tracy Bernard MRN: [id number] Date of Birth: Jul 12, 1931   Vianne Bulls, OTR/L [facility] [address] [place], [place]  [address] [phone] phone [phone] 09/12/19 3:16 PM

## 2019-09-20 ENCOUNTER — Ambulatory Visit: Payer: Medicare Other | Admitting: Physical Therapy

## 2019-09-26 ENCOUNTER — Other Ambulatory Visit: Payer: Self-pay

## 2019-09-26 ENCOUNTER — Ambulatory Visit: Payer: Medicare Other | Admitting: Occupational Therapy

## 2019-09-26 DIAGNOSIS — R293 Abnormal posture: Secondary | ICD-10-CM

## 2019-09-26 DIAGNOSIS — R29898 Other symptoms and signs involving the musculoskeletal system: Secondary | ICD-10-CM | POA: Diagnosis not present

## 2019-09-26 DIAGNOSIS — R41840 Attention and concentration deficit: Secondary | ICD-10-CM

## 2019-09-26 DIAGNOSIS — R278 Other lack of coordination: Secondary | ICD-10-CM

## 2019-09-26 DIAGNOSIS — R29818 Other symptoms and signs involving the nervous system: Secondary | ICD-10-CM

## 2019-09-26 DIAGNOSIS — R2681 Unsteadiness on feet: Secondary | ICD-10-CM | POA: Diagnosis not present

## 2019-09-26 NOTE — Patient Instructions (Signed)
Keeping Thinking Skills Sharp: 1. Jigsaw puzzles 2. Card/board games 3. Talking on the phone/social events 4. [email or web address] 5. Online games 6. Word searches/crossword puzzles 7.  Logic puzzles 8. Aerobic exercise (stationary bike) 9. Eating balanced diet (fruits & veggies) 10. Drink water 11. Try something new--new recipe, hobby 12. Crafts 13. Do a variety of activities that are challenging

## 2019-09-26 NOTE — Therapy (Signed)
[facility] [address] [place], [place], [address] Phone: [phone]   Fax:  [phone]  Occupational Therapy Treatment  Patient Details  Name: Tracy Bernard MRN: [id number] Date of Birth: Apr 07, 1931 Referring Provider (OT): Dr. Andrey Spearman   Encounter Date: 09/26/2019  OT End of Session - 09/26/19 1216    Visit Number  13    Number of Visits  17    Date for OT Re-Evaluation  10/05/19    Authorization Type  Medicare & [employer] (covered 100%)    Authorization Time Period  cert. 07/18/19-10/15/19.    Authorization - Visit Number  13    Authorization - Number of Visits  20    OT Start Time  [phone]    OT Stop Time  1015    OT Time Calculation (min)  39 min    Activity Tolerance  Patient tolerated treatment well    Behavior During Therapy  [facility] for tasks assessed/performed       Past Medical History:  Diagnosis Date  . CKD (chronic kidney disease)    "moderate"  . Hyperlipidemia   . Hypertension   . Spinal stenosis     Past Surgical History:  Procedure Laterality Date  . BREAST BIOPSY Left 1977   benign  . CATARACT EXTRACTION, BILATERAL Bilateral 2014  . CHOLECYSTECTOMY, LAPAROSCOPIC  2001  . TONSILLECTOMY    . TOTAL VAGINAL HYSTERECTOMY  1981   secondary to uterine prolapse    There were no vitals filed for this visit.  Subjective Assessment - 09/26/19 1213    Subjective   pt reports that she is please with progress.  Begins PT soon.    Pertinent History  Parkinson's Disease (dx approx 6 months ago).   PMH:  hyperlipidemia, HTN, Spinal stenosis with chronic back pain, hs of cataract surgery and upcoming eye surgery    Patient Stated Goals  improve coordination/RUE stiffness; prevent future difficulty    Currently in Pain?  Yes    Pain Score  3     Pain Location  Back    Pain Descriptors / Indicators  Aching    Pain Type  Chronic pain    Pain Onset  More than a month ago    Pain Frequency   Intermittent    Aggravating Factors   malpositioning    Pain Relieving Factors  repositioning            Treatment: PWR hands for PWR up and PWR! Step, 10 reps each, min v.c then placing grooved pegs into pegboard with right and left UE's, min-mod difficulty/ v.c Ambulating while tossing scarf between hands, mod difficulty, v.c for larger amplitude movements, min A to recover balance. Ambulating while performing category generation for dual tasking, min v.c for word generation and larger amplitude movements.               OT Education - 09/26/19 1219    Education Details  Discussed adapted strategies for cutting food, picking up items from the floor, opening jars and writing, ways to keep thinking skills sharp.    Person(s) Educated  Patient    Methods  Explanation;Handout    Comprehension  Verbalized understanding       OT Short Term Goals - 09/12/19 0912      OT SHORT TERM GOAL #1   Title  Pt will be independent with PD-specific HEP.--check STGs 08/17/19    Time  4    Period  Weeks  Status  Achieved      OT SHORT TERM GOAL #2   Title  Pt will verbalize understanding of ways to decr risk of future complications related to PD and appropriate community resources.    Time  4    Period  Weeks    Status  Achieved   08/31/19:   met with community resources, needs futher education regarding future complications.  09/12/19 met     OT SHORT TERM GOAL #3   Title  Pt will improve R shoulder flexion to at least 130* for functional reaching/ADLs.    Baseline  120* with -30* elbow ext    Time  4    Period  Weeks    Status  Achieved   08/24/19:  135* with -20* elbow ext     OT SHORT TERM GOAL #4   Title  Pt will improve bilateral hand coordination/dressing ability as shown by fastening/unfastening 3 buttons in less than 60sec in at least 2 trials.    Baseline  39mn 25sec    Time  4    Period  Weeks    Status  Achieved   08/24/19:  58.72sec, 69.93sec.  09/12/19:  met      OT SHORT TERM GOAL #5   Title  Pt will improve eating ability as shown by improving time on PPT #2 in 15sec or less without drops.    Baseline  17.75sec with 1 drop    Time  4    Period  Weeks    Status  Achieved   08/24/19:  9.60sec       OT Long Term Goals - 09/26/19 0939      OT LONG TERM GOAL #1   Title  Pt will verbalize understanding of strategies for ADLs/IADLs to incr safety, decr risk for future complications, incr efficiency/ease (including strategies for picking items off floor, bed mobility, writing, cutting food, grasp, opening jars).--check LTGs 10/06/19    Time  8    Period  Weeks    Status  On-going      OT LONG TERM GOAL #2   Title  Pt will improve R shoulder flexion to at least 130* with at least -20* elbow ext for functional reaching/ADLs.    Baseline  120* with -30* elbow ext    Time  8    Period  Weeks    Status  Achieved   08/24/19:  135* with -20* elbow ext     OT LONG TERM GOAL #3   Title  Pt will improve dominant R hand coordination for ADLs as shown by improving time on 9-hole peg test by at least 10sec.    Baseline  45.06sec    Time  8    Period  Weeks    Status  Achieved   09/12/19:  33.97sec     OT LONG TERM GOAL #4   Title  Pt will improve dominant RUE functional reaching/coordination for ADLs as shown by improving score on box and blocks test by at least 6.    Baseline  39    Time  8    Period  Weeks    Status  Achieved   09/12/19: 45 blocks     OT LONG TERM GOAL #5   Title  Pt will improve bilateral hand coordination/dressing ability as shown by fastening/unfastening 3 buttons in less than 45sec in at least 2 trials.    Baseline  236m 25sec    Time  8  Period  Weeks    Status  Achieved   09/12/19:  45.43, 36.41sec     OT LONG TERM GOAL #6   Title  Pt will verbalize understanding of memory compensation strategies and ways to keep thinking skills sharp.    Time  8    Period  Weeks    Status  On-going      OT LONG TERM GOAL #7    Title  Pt will improve efficiency with dressing as shown by competing PPT#4 in less than 12sec.    Baseline  15.13sec    Time  8    Period  Weeks    Status  On-going   09/12/19: 15.22, 10.37sec           Plan - 09/26/19 1217    Clinical Impression Statement  Pt is progressing towards goals. She agrees with plans for d/c next visit.    Occupational performance deficits (Please refer to evaluation for details):  ADL's;IADL's;Leisure    Body Structure / Function / Physical Skills  ADL;Dexterity;ROM;Balance;IADL;Improper spinal/pelvic alignment;Mobility;Flexibility;Coordination;FMC;Tone;UE functional use;Decreased knowledge of use of [place]    Cognitive Skills  Memory;Attention;Thought    OT Frequency  2x / week    OT Duration  12 weeks   17 visits over 12 weeks   OT Treatment/Interventions  Self-care/ADL training;Moist Heat;Fluidtherapy;DME and/or AE instruction;Splinting;Balance training;Therapeutic activities;Therapeutic exercise;Cognitive remediation/compensation;Passive range of motion;Functional Mobility Training;Neuromuscular education;Cryotherapy;Energy conservation;Manual Therapy;Patient/family education    Plan  reinforce ADL strategies, check goals and d/c    Consulted and Agree with Plan of Care  Patient       Patient will benefit from skilled therapeutic intervention in order to improve the following deficits and impairments:   Body Structure / Function / Physical Skills: ADL, Dexterity, ROM, Balance, IADL, Improper spinal/pelvic alignment, Mobility, Flexibility, Coordination, FMC, Tone, UE functional use, Decreased knowledge of use of DME, GMC Cognitive Skills: Memory, Attention, Thought     Visit Diagnosis: Other symptoms and signs involving the musculoskeletal system  Other symptoms and signs involving the nervous system  Attention and concentration deficit  Decreased coordination  Abnormal posture  Other lack of coordination    Problem List Patient  Active Problem List   Diagnosis Date Noted  . Vitamin D deficiency 12/17/2014  . Atrophic vaginitis 12/17/2014  . HTN (hypertension) 09/10/2013  . Hypercholesterolemia 09/10/2013  . Postmenopausal HRT (hormone replacement therapy) 09/10/2013    Meli Faley 09/26/2019, 12:21 PM  [facility] [address] [place], [place], [address] Phone: [phone]   Fax:  [phone]  Name: Tracy Bernard MRN: [id number] Date of Birth: January 01, 1931

## 2019-09-28 ENCOUNTER — Ambulatory Visit: Payer: Medicare Other | Admitting: Occupational Therapy

## 2019-09-28 ENCOUNTER — Other Ambulatory Visit: Payer: Self-pay

## 2019-09-28 DIAGNOSIS — R41840 Attention and concentration deficit: Secondary | ICD-10-CM | POA: Diagnosis not present

## 2019-09-28 DIAGNOSIS — R29818 Other symptoms and signs involving the nervous system: Secondary | ICD-10-CM | POA: Diagnosis not present

## 2019-09-28 DIAGNOSIS — R29898 Other symptoms and signs involving the musculoskeletal system: Secondary | ICD-10-CM

## 2019-09-28 DIAGNOSIS — R278 Other lack of coordination: Secondary | ICD-10-CM

## 2019-09-28 DIAGNOSIS — R2681 Unsteadiness on feet: Secondary | ICD-10-CM | POA: Diagnosis not present

## 2019-09-28 DIAGNOSIS — R293 Abnormal posture: Secondary | ICD-10-CM | POA: Diagnosis not present

## 2019-09-28 NOTE — Patient Instructions (Signed)

## 2019-09-30 ENCOUNTER — Encounter: Payer: Self-pay | Admitting: Occupational Therapy

## 2019-09-30 NOTE — Therapy (Signed)
[facility] [address] [place], [place], [address] Phone: [phone]   Fax:  [phone]  Occupational Therapy Treatment  Patient Details  Name: Tracy Bernard MRN: [id number] Date of Birth: Apr 06, 1931 Referring Provider (OT): Dr. Andrey Spearman   Encounter Date: 09/28/2019  OT End of Session - 09/30/19 3846    Visit Number  14    Number of Visits  17    Date for OT Re-Evaluation  10/05/19    Authorization Type  Medicare & [employer] (covered 100%)    Authorization Time Period  cert. 07/18/19-10/15/19.    Authorization - Visit Number  14    Authorization - Number of Visits  20    OT Start Time  1325    OT Stop Time  1355    OT Time Calculation (min)  30 min    Activity Tolerance  Patient tolerated treatment well    Behavior During Therapy  WFL for tasks assessed/performed       Past Medical History:  Diagnosis Date  . CKD (chronic kidney disease)    "moderate"  . Hyperlipidemia   . Hypertension   . Spinal stenosis     Past Surgical History:  Procedure Laterality Date  . BREAST BIOPSY Left 1977   benign  . CATARACT EXTRACTION, BILATERAL Bilateral 2014  . CHOLECYSTECTOMY, LAPAROSCOPIC  2001  . TONSILLECTOMY    . TOTAL VAGINAL HYSTERECTOMY  1981   secondary to uterine prolapse    There were no vitals filed for this visit.  Subjective Assessment - 09/30/19 1736    Subjective   pt reports that she is please with progress.  Begins PT soon.    Pertinent History  Parkinson's Disease (dx approx 6 months ago).   PMH:  hyperlipidemia, HTN, Spinal stenosis with chronic back pain, hs of cataract surgery and upcoming eye surgery    Patient Stated Goals  improve coordination/RUE stiffness; prevent future difficulty    Currently in Pain?  Yes    Pain Score  10-Worst pain ever    Pain Location  Back    Pain Descriptors / Indicators  Aching    Pain Type  Chronic pain    Pain Onset  More than a month ago    Aggravating Factors   standing    Pain Relieving Factors  sitting              Treatment: Education regarding memory compensation techniques, handout provided Copying small peg design for increased fine motor coordination, min difficulty/ v.c for larger amplitude movements, cues to perform flicks Ambulating while tossing a ball, then ambulating while performing cognitive task, min v.c for larger amplitude movements Therapist checked progress towards goals                OT Short Term Goals - 09/12/19 0912      OT SHORT TERM GOAL #1   Title  Pt will be independent with PD-specific HEP.--check STGs 08/17/19    Time  4    Period  Weeks    Status  Achieved      OT SHORT TERM GOAL #2   Title  Pt will verbalize understanding of ways to decr risk of future complications related to PD and appropriate community resources.    Time  4    Period  Weeks    Status  Achieved   08/31/19:   met with community resources, needs futher education regarding future complications.  09/12/19 met  OT SHORT TERM GOAL #3   Title  Pt will improve R shoulder flexion to at least 130* for functional reaching/ADLs.    Baseline  120* with -30* elbow ext    Time  4    Period  Weeks    Status  Achieved   08/24/19:  135* with -20* elbow ext     OT SHORT TERM GOAL #4   Title  Pt will improve bilateral hand coordination/dressing ability as shown by fastening/unfastening 3 buttons in less than 60sec in at least 2 trials.    Baseline  59mn 25sec    Time  4    Period  Weeks    Status  Achieved   08/24/19:  58.72sec, 69.93sec.  09/12/19:  met     OT SHORT TERM GOAL #5   Title  Pt will improve eating ability as shown by improving time on PPT #2 in 15sec or less without drops.    Baseline  17.75sec with 1 drop    Time  4    Period  Weeks    Status  Achieved   08/24/19:  9.60sec       OT Long Term Goals - 09/28/19 1325      OT LONG TERM GOAL #1   Title  Pt will verbalize understanding  of strategies for ADLs/IADLs to incr safety, decr risk for future complications, incr efficiency/ease (including strategies for picking items off floor, bed mobility, writing, cutting food, grasp, opening jars).--check LTGs 10/06/19    Time  8    Period  Weeks    Status  Achieved      OT LONG TERM GOAL #2   Title  Pt will improve R shoulder flexion to at least 130* with at least -20* elbow ext for functional reaching/ADLs.    Baseline  120* with -30* elbow ext    Time  8    Period  Weeks    Status  Achieved   08/24/19:  135* with -20* elbow ext     OT LONG TERM GOAL #3   Title  Pt will improve dominant R hand coordination for ADLs as shown by improving time on 9-hole peg test by at least 10sec.    Baseline  45.06sec    Time  8    Period  Weeks    Status  Achieved   09/12/19:  33.97sec     OT LONG TERM GOAL #4   Title  Pt will improve dominant RUE functional reaching/coordination for ADLs as shown by improving score on box and blocks test by at least 6.    Baseline  39    Time  8    Period  Weeks    Status  Achieved   09/12/19: 45 blocks     OT LONG TERM GOAL #5   Title  Pt will improve bilateral hand coordination/dressing ability as shown by fastening/unfastening 3 buttons in less than 45sec in at least 2 trials.    Baseline  216m 25sec    Time  8    Period  Weeks    Status  Achieved   09/12/19:  45.43, 36.41sec     OT LONG TERM GOAL #6   Title  Pt will verbalize understanding of memory compensation strategies and ways to keep thinking skills sharp.    Time  8    Period  Weeks    Status  Achieved      OT LONG TERM GOAL #7   Title  Pt  will improve efficiency with dressing as shown by competing PPT#4 in less than 12sec.    Baseline  15.13sec    Time  8    Period  Weeks    Status  Not Met   12.84 secs however pt wearing sweatshirt today which limits pt performance       OCCUPATIONAL THERAPY DISCHARGE SUMMARY   Current functional level related to goals / functional  outcomes: Pt demonstrates good overall progress, see progress towards goals   Remaining deficits: Decreased coordination,bradykinesia,decreased balance, cognitive deficits    Education / Equipment: Pt was educated in:HEP, adapted strategies for ADLs, memory compensation strategies, strategies to keep thinking skills sharp. Pt verbalized understanding of all education. Plan: Patient agrees to discharge.  Patient goals were partially met. Patient is being discharged due to meeting the stated rehab goals.  ?????           Patient will benefit from skilled therapeutic intervention in order to improve the following deficits and impairments:   Body Structure / Function / Physical Skills: ADL, Dexterity, ROM, Balance, IADL, Improper spinal/pelvic alignment, Mobility, Flexibility, Coordination, FMC, Tone, UE functional use, Decreased knowledge of use of DME, GMC Cognitive Skills: Memory, Attention, Thought     Visit Diagnosis: Other symptoms and signs involving the musculoskeletal system  Other symptoms and signs involving the nervous system  Attention and concentration deficit  Decreased coordination  Abnormal posture  Other lack of coordination    Problem List Patient Active Problem List   Diagnosis Date Noted  . Vitamin D deficiency 12/17/2014  . Atrophic vaginitis 12/17/2014  . HTN (hypertension) 09/10/2013  . Hypercholesterolemia 09/10/2013  . Postmenopausal HRT (hormone replacement therapy) 09/10/2013    Rossi Silvestro 09/30/2019, 5:37 PM Theone Murdoch, OTR/L [phone] Phone: [phone] 5:52 PM 11/22/20[facility] [address] [place], [place], [address] Phone: [phone]   Fax:  [phone]  Name: Tracy Bernard MRN: [id number] Date of Birth: 09-Apr-1931

## 2019-10-02 ENCOUNTER — Encounter: Payer: Medicare Other | Admitting: Occupational Therapy

## 2019-10-05 ENCOUNTER — Encounter: Payer: Medicare Other | Admitting: Occupational Therapy

## 2019-10-10 ENCOUNTER — Encounter: Payer: Medicare Other | Admitting: Occupational Therapy

## 2019-10-11 ENCOUNTER — Other Ambulatory Visit: Payer: Self-pay

## 2019-10-11 ENCOUNTER — Ambulatory Visit: Payer: Medicare Other | Attending: Diagnostic Neuroimaging | Admitting: Physical Therapy

## 2019-10-11 ENCOUNTER — Encounter: Payer: Self-pay | Admitting: Physical Therapy

## 2019-10-11 DIAGNOSIS — R2681 Unsteadiness on feet: Secondary | ICD-10-CM | POA: Insufficient documentation

## 2019-10-11 DIAGNOSIS — R293 Abnormal posture: Secondary | ICD-10-CM | POA: Insufficient documentation

## 2019-10-11 DIAGNOSIS — R2689 Other abnormalities of gait and mobility: Secondary | ICD-10-CM | POA: Diagnosis not present

## 2019-10-12 NOTE — Therapy (Signed)
[facility] [address] [place], [place], [address] Phone: [phone]   Fax:  [phone]  Physical Therapy Evaluation  Patient Details  Name: Tracy Bernard MRN: [id number] Date of Birth: [birth year] Referring Provider (PT): Penumalli   Encounter Date: 10/11/2019  PT End of Session - 10/12/19 1238    Visit Number  1    Number of Visits  9    Date for PT Re-Evaluation  01/10/20   pt delayed start in January, due to pt being out of town for holidays   Authorization Type  Medicare, AARP    PT Start Time  0804    PT Stop Time  [phone]    PT Time Calculation (min)  40 min    Activity Tolerance  Patient tolerated treatment well    Behavior During Therapy  [facility] for tasks assessed/performed       Past Medical History:  Diagnosis Date  . CKD (chronic kidney disease)    "moderate"  . Hyperlipidemia   . Hypertension   . Spinal stenosis     Past Surgical History:  Procedure Laterality Date  . BREAST BIOPSY Left 1977   benign  . CATARACT EXTRACTION, BILATERAL Bilateral 2014  . CHOLECYSTECTOMY, LAPAROSCOPIC  2001  . TONSILLECTOMY    . TOTAL VAGINAL HYSTERECTOMY  1981   secondary to uterine prolapse    There were no vitals filed for this visit.   Subjective Assessment - 10/11/19 [id number]    Subjective  Dr. Leta Baptist thought when I saw him that PT might be helpful.  Feel like my balance is not good.  Sometimes feel unsteady more than others.  No falls in the past 6 months.    Pertinent History  Spinal stenosis with neurogenic claudication    Limitations  Standing    How long can you stand comfortably?  depends    How long can you walk comfortably?  depends    Patient Stated Goals  Pt's goals for therapy are to help with balance.    Currently in Pain?  No/denies         [facility] PT Assessment - 10/11/19 [id number]      Assessment   Medical Diagnosis  Parkinson's Disease, lumbar stenosis    Referring Provider (PT)   Penumalli    Onset Date/Surgical Date  08/27/19   MD visit   Hand Dominance  Right      Precautions   Precautions  Fall      Balance Screen   Has the patient fallen in the past 6 months  No    Has the patient had a decrease in activity level because of a fear of falling?   Yes    Is the patient reluctant to leave their home because of a fear of falling?   No      Home Environment   Living Environment  Private residence    Living Arrangements  Alone    Type of [place] to enter    Entrance Stairs-Number of Steps  1    [place]  Two level;Able to live on main level with bedroom/bathroom    Alternate Level Stairs-Number of Steps  16    Alternate Level Stairs-Rails  Left    [place] - quad;Walker - 2 wheels      Prior Function   Level of Independence  Independent    Leisure  reading, playing solitaire, going out  to eat; trying to keep up with OT/previous PT exercises      Posture/Postural Control   Posture/Postural Control  Postural limitations    Postural Limitations  Rounded Shoulders;Forward head;Flexed trunk      ROM / Strength   AROM / PROM / Strength  AROM;Strength      AROM   Overall AROM   Deficits    Overall AROM Comments  Grossly WFL; tightness noted in R hamstrings      Strength   Overall Strength Comments  Grossly tested at least 4/5 bilateral lower extremity strength      Transfers   Transfers  Sit to Stand;Stand to Sit    Sit to Stand  6: Modified independent (Device/Increase time);Without upper extremity assist;From chair/3-in-1    Five time sit to stand comments   9.59    Stand to Sit  6: Modified independent (Device/Increase time);Without upper extremity assist;To chair/3-in-1      Ambulation/Gait   Ambulation/Gait  Yes    Ambulation/Gait Assistance  7: Independent    Ambulation Distance (Feet)  200 Feet    Assistive device  None    Gait Pattern  Step-through pattern;Narrow base of support;Decreased step length  - right;Decreased step length - left;Trunk flexed    Ambulation Surface  Level;Indoor    Gait velocity  8.22 sec = 3.99 ft/sec      Standardized Balance Assessment   Standardized Balance Assessment  Timed Up and Go Test      Timed Up and Go Test   Normal TUG (seconds)  10.47    Manual TUG (seconds)  9.41    Cognitive TUG (seconds)  10.34    TUG Comments  Scores >13.5-15 seconds indicate increased fall risk.      High Level Balance   High Level Balance Comments  Standing on solid surface EO and EC;  30 seconds; on foam, EO and EC 30 seconds, with increased sway EC      Functional Gait  Assessment   Gait assessed   Yes    Gait Level Surface  Walks 20 ft in less than 5.5 sec, no assistive devices, good speed, no evidence for imbalance, normal gait pattern, deviates no more than 6 in outside of the 12 in walkway width.   5.78   Change in Gait Speed  Able to smoothly change walking speed without loss of balance or gait deviation. Deviate no more than 6 in outside of the 12 in walkway width.    Gait with Horizontal Head Turns  Performs head turns with moderate changes in gait velocity, slows down, deviates 10-15 in outside 12 in walkway width but recovers, can continue to walk.    Gait with Vertical Head Turns  Performs task with slight change in gait velocity (eg, minor disruption to smooth gait path), deviates 6 - 10 in outside 12 in walkway width or uses assistive device    Gait and Pivot Turn  Pivot turns safely within 3 sec and stops quickly with no loss of balance.    Step Over Obstacle  Is able to step over one shoe box (4.5 in total height) but must slow down and adjust steps to clear box safely. May require verbal cueing.    Gait with Narrow Base of Support  Ambulates 4-7 steps.    Gait with Eyes Closed  Walks 20 ft, slow speed, abnormal gait pattern, evidence for imbalance, deviates 10-15 in outside 12 in walkway width. Requires more than 9 sec to ambulate  20 ft.   14.41   Ambulating  Backwards  Walks 20 ft, slow speed, abnormal gait pattern, evidence for imbalance, deviates 10-15 in outside 12 in walkway width.   30.81 sec narrow BOS   Steps  Alternating feet, must use rail.    Total Score  17    FGA comment:  Scores <22/30 indicate increased fall risk                Objective measurements completed on examination: See above findings.                   PT Long Term Goals - 10/12/19 1249      PT LONG TERM GOAL #1   Title  Pt will be independent with HEP for improved balance and dynamic gait.  TARGET 12/07/2019 (delayed date, due to pt not starting until January 2021 due to being out of town)    Time  4    Period  Weeks    Status  New      PT LONG TERM GOAL #2   Title  Pt will improve Functional Gait Assessment score to at least 20/30 for decreased fall risk.    Time  4    Period  Weeks    Status  New      PT LONG TERM GOAL #3   Title  Pt will verbalize understanding of fall prevention in home environment.    Time  4    Status  New      PT LONG TERM GOAL #4   Title  Pt will ambulate at least 1000, indoors/outdoors surfaces, independently, verbalizing/demonstrating apporpriate strategies to manage back pain with gait.    Time  4    Period  Weeks    Status  New             Plan - 10/12/19 1241    Clinical Impression Statement  Pt is an 85 year old female who presents to OPPT with history of Parkinson's disease, lumbar stenosis and back pain; she reports that her balance isn't great.  Pt presents with decreased balance, abnormality of gait.  She is at fall risk per FGA score of 17/30.  She demonstrates potential decreased vestibular use for balance, as noted by significantly increased sway with eyes closed on foam.  She will benefit from skilled PT to address the above stated deficits for decreased fall risk, improved overall functional mobility.    Personal Factors and Comorbidities  Comorbidity 3+    Comorbidities  HTN,  hypercholesterolemia, CKD, spinal stenosis    Examination-Activity Limitations  Locomotion Level    Examination-Participation Restrictions  Community Activity    Stability/Clinical Decision Making  Stable/Uncomplicated    Clinical Decision Making  Low    Rehab Potential  Good    PT Frequency  2x / week    PT Duration  4 weeks   plus eval   PT Treatment/Interventions  ADLs/Self Care Home Management;DME Instruction;Balance training;Therapeutic exercise;Therapeutic activities;Functional mobility training;Gait training;Neuromuscular re-education;Patient/family education    PT Next Visit Plan  Initiate HEP to address balance-compliant surfaces, head motions;  SLS, tandem stance    Consulted and Agree with Plan of Care  Patient       Patient will benefit from skilled therapeutic intervention in order to improve the following deficits and impairments:  Abnormal gait, Difficulty walking, Decreased balance, Decreased mobility, Postural dysfunction, Impaired flexibility  Visit Diagnosis: Other abnormalities of gait and mobility  Unsteadiness on feet  Abnormal posture     Problem List Patient Active Problem List   Diagnosis Date Noted  . Vitamin D deficiency 12/17/2014  . Atrophic vaginitis 12/17/2014  . HTN (hypertension) 09/10/2013  . Hypercholesterolemia 09/10/2013  . Postmenopausal HRT (hormone replacement therapy) 09/10/2013    MARRIOTT,AMY W. 10/12/2019, 12:52 PM  Frazier Butt., PT   [facility] [address] [place], [place], [address] Phone: [phone]   Fax:  [phone]  Name: MARET DIAZ MRN: [id number] Date of Birth: 21-Aug-1931

## 2019-10-16 ENCOUNTER — Encounter: Payer: Medicare Other | Admitting: Occupational Therapy

## 2019-11-12 DIAGNOSIS — K219 Gastro-esophageal reflux disease without esophagitis: Secondary | ICD-10-CM | POA: Diagnosis not present

## 2019-11-12 DIAGNOSIS — H409 Unspecified glaucoma: Secondary | ICD-10-CM | POA: Diagnosis not present

## 2019-11-12 DIAGNOSIS — Z1389 Encounter for screening for other disorder: Secondary | ICD-10-CM | POA: Diagnosis not present

## 2019-11-12 DIAGNOSIS — Z0000 Encounter for general adult medical examination without abnormal findings: Secondary | ICD-10-CM | POA: Diagnosis not present

## 2019-11-12 DIAGNOSIS — J309 Allergic rhinitis, unspecified: Secondary | ICD-10-CM | POA: Diagnosis not present

## 2019-11-12 DIAGNOSIS — N959 Unspecified menopausal and perimenopausal disorder: Secondary | ICD-10-CM | POA: Diagnosis not present

## 2019-11-12 DIAGNOSIS — Z23 Encounter for immunization: Secondary | ICD-10-CM | POA: Diagnosis not present

## 2019-11-12 DIAGNOSIS — I10 Essential (primary) hypertension: Secondary | ICD-10-CM | POA: Diagnosis not present

## 2019-11-12 DIAGNOSIS — E782 Mixed hyperlipidemia: Secondary | ICD-10-CM | POA: Diagnosis not present

## 2019-11-12 DIAGNOSIS — Z79899 Other long term (current) drug therapy: Secondary | ICD-10-CM | POA: Diagnosis not present

## 2019-11-12 DIAGNOSIS — G20 Parkinson's disease: Secondary | ICD-10-CM | POA: Diagnosis not present

## 2019-11-13 ENCOUNTER — Other Ambulatory Visit: Payer: Self-pay

## 2019-11-13 ENCOUNTER — Encounter: Payer: Self-pay | Admitting: Physical Therapy

## 2019-11-13 ENCOUNTER — Ambulatory Visit: Payer: Medicare Other | Attending: Diagnostic Neuroimaging | Admitting: Physical Therapy

## 2019-11-13 DIAGNOSIS — R2689 Other abnormalities of gait and mobility: Secondary | ICD-10-CM | POA: Diagnosis not present

## 2019-11-13 DIAGNOSIS — R293 Abnormal posture: Secondary | ICD-10-CM | POA: Diagnosis not present

## 2019-11-13 DIAGNOSIS — R2681 Unsteadiness on feet: Secondary | ICD-10-CM

## 2019-11-13 NOTE — Patient Instructions (Addendum)
Access Code: [email or web address]  URL: [email or web address]  Date: 11/13/2019  Prepared by: Mady Haagensen   Exercises Single Leg Stance with Support - 3 reps - 1 sets - 10 sec hold - 1x daily - 5x weekly Standing Tandem Balance with Counter Support - 3 reps - 1 sets - 10 sec hold - 1x daily - 5x weekly Backward Walking with Counter Support - 3 reps - 1 sets - 1x daily - 5x weekly  From VHI exercises on Epic: Added Feet apart/Feet together, EO head turns, head nods on compliant surface, 5 reps, 2 sets per day (performed in corner, UE support at chair)

## 2019-11-13 NOTE — Therapy (Signed)
[facility] [address] [place], [place], [address] Phone: [phone]   Fax:  [phone]  Physical Therapy Treatment  Patient Details  Name: Tracy Bernard MRN: [id number] Date of Birth: 1930/12/25 Referring Provider (PT): Penumalli   Encounter Date: 11/13/2019  PT End of Session - 11/13/19 1255    Visit Number  2    Number of Visits  9    Date for PT Re-Evaluation  01/10/20   pt delayed start in January, due to pt being out of town for holidays   Authorization Type  Medicare, AARP    PT Start Time  1023    PT Stop Time  1102    PT Time Calculation (min)  39 min    Activity Tolerance  Patient tolerated treatment well    Behavior During Therapy  [facility] for tasks assessed/performed       Past Medical History:  Diagnosis Date  . CKD (chronic kidney disease)    "moderate"  . Hyperlipidemia   . Hypertension   . Spinal stenosis     Past Surgical History:  Procedure Laterality Date  . BREAST BIOPSY Left 1977   benign  . CATARACT EXTRACTION, BILATERAL Bilateral 2014  . CHOLECYSTECTOMY, LAPAROSCOPIC  2001  . TONSILLECTOMY    . TOTAL VAGINAL HYSTERECTOMY  1981   secondary to uterine prolapse    There were no vitals filed for this visit.  Subjective Assessment - 11/13/19 1024    Subjective  No changes since my evaluation.  Had a quiet time over the holidays.    Pertinent History  Spinal stenosis with neurogenic claudication    Limitations  Standing    How long can you stand comfortably?  depends    How long can you walk comfortably?  depends    Patient Stated Goals  Pt's goals for therapy are to help with balance.    Currently in Pain?  No/denies                       [facility] Adult PT Treatment/Exercise - 11/13/19 0001      Ambulation/Gait   Ambulation/Gait  Yes    Ambulation/Gait Assistance  7: Independent    Ambulation Distance (Feet)  230 Feet    Assistive device  None    Gait Pattern   Step-through pattern;Narrow base of support;Decreased step length - right;Decreased step length - left;Trunk flexed    Ambulation Surface  Level;Indoor    Gait Comments  Cues for looking ahead at visual target.  Gait 40 ft x 6 reps, with head turns/environmental scanning, with close supervision/min guard.          Balance Exercises - 11/13/19 1028      Balance Exercises: Standing   Standing Eyes Opened  Wide (BOA);Head turns;5 reps;Solid surface;Foam/compliant surface;Narrow base of support (BOS)   Head nods; 2 sets   Standing Eyes Closed  Wide (BOA);Solid surface;2 reps;10 secs;Narrow base of support (BOS);Foam/compliant surface    Tandem Stance  Eyes open;Upper extremity support 1;3 reps;10 secs   cues to look ahead   SLS  Eyes open;Solid surface;3 reps;10 secs;Upper extremity support 2   Cues to look ahead   SLS with Vectors  Solid surface;Upper extremity assist 2;Other reps (comment)   Alternating,then consecutive step taps to 4" step, 10 reps   Step Ups  Forward;6 inch;UE support 2    Gait with Head Turns  Forward;3 reps;Upper extremity support   Laps along counter  Tandem Gait  Forward;Upper extremity support;3 reps   Laps along counter   Retro Gait  Upper extremity support;3 reps   Forward/back walking along counter   Sidestepping  3 reps   Along counter   Marching  Foam/compliant surface;Upper extremity assist 2;10 reps    Heel Raises  Both;10 reps   compliant surface, BUE support   Toe Raise  Both;10 reps   compliant surface x 10 reps       PT Education - 11/13/19 1255    Education Details  Additions to HEP-see instructions    Person(s) Educated  Patient    Methods  Explanation;Demonstration;Handout    Comprehension  Verbalized understanding;Returned demonstration          PT Long Term Goals - 11/13/19 1259      PT LONG TERM GOAL #1   Title  Pt will be independent with HEP for improved balance and dynamic gait.  TARGET 12/07/2019 (delayed date, due to pt  not starting until January 2021 due to being out of town)    Time  4    Period  Weeks    Status  New      PT LONG TERM GOAL #2   Title  Pt will improve Functional Gait Assessment score to at least 20/30 for decreased fall risk.    Time  4    Period  Weeks    Status  New      PT LONG TERM GOAL #3   Title  Pt will verbalize understanding of fall prevention in home environment.    Time  4    Status  New      PT LONG TERM GOAL #4   Title  Pt will ambulate at least 1000, indoors/outdoors surfaces, independently, verbalizing/demonstrating apporpriate strategies to manage back pain with gait.    Time  4    Period  Weeks    Status  New            Plan - 11/13/19 1256    Clinical Impression Statement  Pt returns to OPPT for first visit since PT eval, as she has been out of town visiting her daughter, so delayed scheduling was at patient's request.  Initiated HEP today to address balance dynamically and on compliant surfaces.  Pt requires UE support for tandem, SLS activities and dynamic activities along counter.  Pt's goals remain appropriate; date extended to reflect pt's later start to therapy.    Personal Factors and Comorbidities  Comorbidity 3+    Comorbidities  HTN, hypercholesterolemia, CKD, spinal stenosis    Examination-Activity Limitations  Locomotion Level    Examination-Participation Restrictions  Community Activity    Stability/Clinical Decision Making  Stable/Uncomplicated    Rehab Potential  Good    PT Frequency  2x / week    PT Duration  4 weeks   plus eval   PT Treatment/Interventions  ADLs/Self Care Home Management;DME Instruction;Balance training;Therapeutic exercise;Therapeutic activities;Functional mobility training;Gait training;Neuromuscular re-education;Patient/family education    PT Next Visit Plan  Review HEP-continue to work compliant surfaces, head motions, SLS and tandem stance; dynamic balance; PWR! Moves in standing? (pt may already be doing at home)     Consulted and Agree with Plan of Care  Patient       Patient will benefit from skilled therapeutic intervention in order to improve the following deficits and impairments:  Abnormal gait, Difficulty walking, Decreased balance, Decreased mobility, Postural dysfunction, Impaired flexibility  Visit Diagnosis: Unsteadiness on feet  Other abnormalities of  gait and mobility  Abnormal posture     Problem List Patient Active Problem List   Diagnosis Date Noted  . Vitamin D deficiency 12/17/2014  . Atrophic vaginitis 12/17/2014  . HTN (hypertension) 09/10/2013  . Hypercholesterolemia 09/10/2013  . Postmenopausal HRT (hormone replacement therapy) 09/10/2013    Isela Stantz W. 11/13/2019, 1:00 PM  Frazier Butt., PT   [facility] [address] [place], [place], [address] Phone: [phone]   Fax:  [phone]  Name: Tracy Bernard MRN: [id number] Date of Birth: [birth year]

## 2019-11-15 ENCOUNTER — Encounter: Payer: Self-pay | Admitting: Physical Therapy

## 2019-11-15 ENCOUNTER — Other Ambulatory Visit: Payer: Self-pay

## 2019-11-15 ENCOUNTER — Ambulatory Visit: Payer: Medicare Other | Admitting: Physical Therapy

## 2019-11-15 DIAGNOSIS — R2689 Other abnormalities of gait and mobility: Secondary | ICD-10-CM | POA: Diagnosis not present

## 2019-11-15 DIAGNOSIS — R2681 Unsteadiness on feet: Secondary | ICD-10-CM | POA: Diagnosis not present

## 2019-11-15 DIAGNOSIS — R293 Abnormal posture: Secondary | ICD-10-CM | POA: Diagnosis not present

## 2019-11-15 NOTE — Patient Instructions (Signed)
Walk inside of your home, (either a long hallway or loop of rooms), 3 times a day (perhaps around meals):  -Think of your tall posture, equal and even step length and comfortable arm swing -You can either set a timer or walk several laps each time

## 2019-11-15 NOTE — Therapy (Signed)
[facility] [address] [place], [place], [address] Phone: [phone]   Fax:  [phone]  Physical Therapy Treatment  Patient Details  Name: Tracy Bernard MRN: [id number] Date of Birth: [birth year] Referring Provider (PT): Penumalli   Encounter Date: 11/15/2019  PT End of Session - 11/15/19 2006    Visit Number  3    Number of Visits  9    Date for PT Re-Evaluation  01/10/20   pt delayed start in January, due to pt being out of town for holidays   Authorization Type  Medicare, [employer]    PT Start Time  1020    PT Stop Time  1101    PT Time Calculation (min)  41 min    Activity Tolerance  Patient tolerated treatment well    Behavior During Therapy  [facility] for tasks assessed/performed       Past Medical History:  Diagnosis Date  . CKD (chronic kidney disease)    "moderate"  . Hyperlipidemia   . Hypertension   . Spinal stenosis     Past Surgical History:  Procedure Laterality Date  . BREAST BIOPSY Left 1977   benign  . CATARACT EXTRACTION, BILATERAL Bilateral 2014  . CHOLECYSTECTOMY, LAPAROSCOPIC  2001  . TONSILLECTOMY    . TOTAL VAGINAL HYSTERECTOMY  1981   secondary to uterine prolapse    There were no vitals filed for this visit.  Subjective Assessment - 11/15/19 1021    Subjective  Having a little back pain this morning-not sure why.  When I sit, it goes away.    Pertinent History  Spinal stenosis with neurogenic claudication    Limitations  Standing    How long can you stand comfortably?  depends    How long can you walk comfortably?  depends    Patient Stated Goals  Pt's goals for therapy are to help with balance.    Currently in Pain?  Yes    Pain Score  3     Pain Location  Back    Pain Orientation  Lower;Right    Pain Descriptors / Indicators  Aching    Pain Type  Chronic pain    Pain Onset  More than a month ago    Pain Frequency  Constant    Aggravating Factors   standing    Pain  Relieving Factors  sitting                       [facility] Adult PT Treatment/Exercise - 11/15/19 0001      Ambulation/Gait   Ambulation/Gait  Yes    Ambulation/Gait Assistance  7: Independent    Ambulation Distance (Feet)  175 Feet   x 2 with environmental scanning   Assistive device  None    Gait Pattern  Step-through pattern;Narrow base of support;Decreased step length - right;Decreased step length - left;Trunk flexed    Ambulation Surface  Level;Indoor    Gait Comments  Gait 230 ft with head turns looking at cards with therapist posterior to patient; additional gait 300 ft with use of walking poles to faciliate reciprocal arm swing (also cues for upright posture and increased step length).  Transition to gait x 115 ft no walking poles with VCs for continued arm swing, increased step length, and  upright posture.          Balance Exercises - 11/15/19 1024      Balance Exercises: Standing   Standing Eyes Opened  Wide (BOA);Head turns;5 reps;Solid surface;Foam/compliant surface;Narrow base of support (BOS)   Head nods   Standing Eyes Closed  Wide (BOA);Solid surface;Narrow base of support (BOS);Foam/compliant surface;Head turns;5 reps   Head nods   Stepping Strategy  Anterior;Posterior;Lateral;Foam/compliant surface;UE support;10 reps    Gait with Head Turns  Forward   in hallway, 20 ft x 4 reps; head nods 20 ft x 4 reps   Partial Tandem Stance  Eyes open;Foam/compliant surface;5 reps   Head turns, head nods   Retro Gait  Upper extremity support;3 reps;Foam/compliant surface   Fwd/back along counter, 3 reps solid, 3 reps foam   Sidestepping  Upper extremity support;Head turns;4 reps   Head turns x 2 reps, no head turns x 2 reps   Marching  Solid surface;Foam/compliant surface;Upper extremity assist 1;Forwards;Retro;Other reps (comment)   4 reps along counter       PT Education - 11/15/19 2005    Education Details  Walking as addition to HEP-see instructions     Person(s) Educated  Patient    Methods  Explanation;Demonstration;Handout    Comprehension  Verbalized understanding;Returned demonstration          PT Long Term Goals - 11/13/19 1259      PT LONG TERM GOAL #1   Title  Pt will be independent with HEP for improved balance and dynamic gait.  TARGET 12/07/2019 (delayed date, due to pt not starting until January 2021 due to being out of town)    Time  4    Period  Weeks    Status  New      PT LONG TERM GOAL #2   Title  Pt will improve Functional Gait Assessment score to at least 20/30 for decreased fall risk.    Time  4    Period  Weeks    Status  New      PT LONG TERM GOAL #3   Title  Pt will verbalize understanding of fall prevention in home environment.    Time  4    Status  New      PT LONG TERM GOAL #4   Title  Pt will ambulate at least 1000, indoors/outdoors surfaces, independently, verbalizing/demonstrating apporpriate strategies to manage back pain with gait.    Time  4    Period  Weeks    Status  New            Plan - 11/15/19 2007    Clinical Impression Statement  Reviewed HEP, with pt return demo understanding.  Continued to work on additional balance and gait activities, with addition to HEP for focus on walking with intentional arm swing, step length, posture.  Pt will continue to benefit from skilled PT to address balance and gait for overall improved functional mobility.    Personal Factors and Comorbidities  Comorbidity 3+    Comorbidities  HTN, hypercholesterolemia, CKD, spinal stenosis    Examination-Activity Limitations  Locomotion Level    Examination-Participation Restrictions  Community Activity    Stability/Clinical Decision Making  Stable/Uncomplicated    Rehab Potential  Good    PT Frequency  2x / week    PT Duration  4 weeks   plus eval   PT Treatment/Interventions  ADLs/Self Care Home Management;DME Instruction;Balance training;Therapeutic exercise;Therapeutic activities;Functional mobility  training;Gait training;Neuromuscular re-education;Patient/family education    PT Next Visit Plan  Review how did walking program go at home?  Continue to work compliant surfaces, head motions, SLS and tandem stance; dynamic balance; PWR! Moves in standing? (  pt may already be doing at home)    Consulted and Agree with Plan of Care  Patient       Patient will benefit from skilled therapeutic intervention in order to improve the following deficits and impairments:  Abnormal gait, Difficulty walking, Decreased balance, Decreased mobility, Postural dysfunction, Impaired flexibility  Visit Diagnosis: Unsteadiness on feet  Other abnormalities of gait and mobility     Problem List Patient Active Problem List   Diagnosis Date Noted  . Vitamin D deficiency 12/17/2014  . Atrophic vaginitis 12/17/2014  . HTN (hypertension) 09/10/2013  . Hypercholesterolemia 09/10/2013  . Postmenopausal HRT (hormone replacement therapy) 09/10/2013    Toni Demo W. 11/15/2019, 8:10 PM Frazier Butt., PT  [facility] [address] [place], [place], [address] Phone: [phone]   Fax:  [phone]  Name: Tracy Bernard MRN: [id number] Date of Birth: May 16, 1931

## 2019-11-19 ENCOUNTER — Encounter: Payer: Self-pay | Admitting: Physical Therapy

## 2019-11-19 ENCOUNTER — Other Ambulatory Visit: Payer: Self-pay

## 2019-11-19 ENCOUNTER — Ambulatory Visit: Payer: Medicare Other | Admitting: Physical Therapy

## 2019-11-19 DIAGNOSIS — R2681 Unsteadiness on feet: Secondary | ICD-10-CM

## 2019-11-19 DIAGNOSIS — R2689 Other abnormalities of gait and mobility: Secondary | ICD-10-CM

## 2019-11-19 DIAGNOSIS — R293 Abnormal posture: Secondary | ICD-10-CM | POA: Diagnosis not present

## 2019-11-19 NOTE — Therapy (Signed)
[facility] [address] [place], [place], [address] Phone: [phone]   Fax:  [phone]  Physical Therapy Treatment  Patient Details  Name: Tracy Bernard MRN: [id number] Date of Birth: [birth year] Referring Provider (PT): Penumalli   Encounter Date: 11/19/2019  PT End of Session - 11/19/19 1725    Visit Number  4    Number of Visits  9    Date for PT Re-Evaluation  01/10/20   pt delayed start in January, due to pt being out of town for holidays   Authorization Type  Medicare, [employer]    PT Start Time  [phone]    PT Stop Time  1013    PT Time Calculation (min)  39 min    Activity Tolerance  Patient tolerated treatment well    Behavior During Therapy  [facility] for tasks assessed/performed       Past Medical History:  Diagnosis Date  . CKD (chronic kidney disease)    "moderate"  . Hyperlipidemia   . Hypertension   . Spinal stenosis     Past Surgical History:  Procedure Laterality Date  . BREAST BIOPSY Left 1977   benign  . CATARACT EXTRACTION, BILATERAL Bilateral 2014  . CHOLECYSTECTOMY, LAPAROSCOPIC  2001  . TONSILLECTOMY    . TOTAL VAGINAL HYSTERECTOMY  1981   secondary to uterine prolapse    There were no vitals filed for this visit.  Subjective Assessment - 11/19/19 0935    Subjective  Today, the back pain not so bad.  Doing well, no changes.    Pertinent History  Spinal stenosis with neurogenic claudication    Limitations  Standing    How long can you stand comfortably?  depends    How long can you walk comfortably?  depends    Patient Stated Goals  Pt's goals for therapy are to help with balance.    Currently in Pain?  Yes    Pain Location  Back    Pain Orientation  Lower;Right    Pain Descriptors / Indicators  Aching    Pain Type  Chronic pain    Pain Onset  More than a month ago    Pain Frequency  Constant    Aggravating Factors   standing    Pain Relieving Factors  sitting                        [facility] Adult PT Treatment/Exercise - 11/19/19 0001      Ambulation/Gait   Ambulation/Gait  Yes    Ambulation/Gait Assistance  7: Independent    Ambulation Distance (Feet)  345 Feet   230   Assistive device  None    Gait Pattern  Step-through pattern;Narrow base of support;Decreased step length - right;Decreased step length - left;Trunk flexed    Ambulation Surface  Level;Indoor    Gait Comments  Additional 230 ft of gait with environmental scanning at end of session; no LOB, slightly slowed pace        PWR [facility]) - 11/19/19 0947    PWR! exercises  Moves in sitting;Moves in standing    PWR! Up  x 10    PWR! Rock  x 10 reps each side    PWR! Twist  x 5 reps each side    PWR Step  x 10 reps each side    Basic 4 Flow  Instructed in and performed PWR! Moves Flow in standing x 5 reps  Comments  Pt is performing standing PWR! Moves exercises at home; visual and verbal cues as reminders and for instruction on standing PWR! Moves Flow    PWR! Up  x 10 reps    PWR! Rock  x 5 reps each side    PWR! Twist  x 5 reps each side    PWR! Step  x 5 reps each side    Comments  Pt is performing seated PWR! Moves at home; pt return demo understanding with verbal/visual cues for reminders.       Balance Exercises - 11/19/19 0958      Balance Exercises: Standing   Standing Eyes Opened  Wide (BOA);Head turns;5 reps;Solid surface;Foam/compliant surface;Narrow base of support (BOS)   Head nods; diagonal head motions x 5   Standing Eyes Closed  Wide (BOA);Solid surface;Narrow base of support (BOS);Foam/compliant surface;5 reps;Head turns   Head nods   Partial Tandem Stance  Eyes open;Foam/compliant surface;5 reps   Head nods/turns; head steady EC x 10 seconds   Retro Gait  Upper extremity support;3 reps;Foam/compliant surface   Along counter-red/blue mat; fwd/back walking   Sidestepping  Upper extremity support;Head turns;4 reps   Along counter-red/blue mat    Marching  Solid surface;Foam/compliant surface;Upper extremity assist 1;Forwards;Retro;Other reps (comment)   along counter, 4 reps; on red/blue mat   Other Standing Exercises  Standing on doubled blue mat:  forward step taps x 10 reps, side step taps x 10 reps        PT Education - 11/19/19 1724    Education Details  Continue her PWR! Moves (especially in sitting and standing at home) as well as walking and balance exercises    Person(s) Educated  Patient    Methods  Explanation    Comprehension  Verbalized understanding          PT Long Term Goals - 11/13/19 1259      PT LONG TERM GOAL #1   Title  Pt will be independent with HEP for improved balance and dynamic gait.  TARGET 12/07/2019 (delayed date, due to pt not starting until January 2021 due to being out of town)    Time  4    Period  Weeks    Status  New      PT LONG TERM GOAL #2   Title  Pt will improve Functional Gait Assessment score to at least 20/30 for decreased fall risk.    Time  4    Period  Weeks    Status  New      PT LONG TERM GOAL #3   Title  Pt will verbalize understanding of fall prevention in home environment.    Time  4    Status  New      PT LONG TERM GOAL #4   Title  Pt will ambulate at least 1000, indoors/outdoors surfaces, independently, verbalizing/demonstrating apporpriate strategies to manage back pain with gait.    Time  4    Period  Weeks    Status  New            Plan - 11/19/19 1726    Clinical Impression Statement  Continued to work on compliant surface balance activities, as well as PWR! Moves in sitting and standing (including introduction of PWR! Moves Flow in standing for balance challenge).  On blue, more compliant mat surface, pt requires slowed pace and UE support with dynamic balance activities.  She will continue to benefit from skilled PT to address balance and gait  for overall improved mobility.    Personal Factors and Comorbidities  Comorbidity 3+    Comorbidities   HTN, hypercholesterolemia, CKD, spinal stenosis    Examination-Activity Limitations  Locomotion Level    Examination-Participation Restrictions  Community Activity    Stability/Clinical Decision Making  Stable/Uncomplicated    Rehab Potential  Good    PT Frequency  2x / week    PT Duration  4 weeks   plus eval   PT Treatment/Interventions  ADLs/Self Care Home Management;DME Instruction;Balance training;Therapeutic exercise;Therapeutic activities;Functional mobility training;Gait training;Neuromuscular re-education;Patient/family education    PT Next Visit Plan  Continue to work compliant surfaces, head motions, SLS and tandem stance; dynamic balance; PWR! Moves in standing? (perhaps on complian surface and with FLOW)    Consulted and Agree with Plan of Care  Patient       Patient will benefit from skilled therapeutic intervention in order to improve the following deficits and impairments:  Abnormal gait, Difficulty walking, Decreased balance, Decreased mobility, Postural dysfunction, Impaired flexibility  Visit Diagnosis: Unsteadiness on feet  Other abnormalities of gait and mobility     Problem List Patient Active Problem List   Diagnosis Date Noted  . Vitamin D deficiency 12/17/2014  . Atrophic vaginitis 12/17/2014  . HTN (hypertension) 09/10/2013  . Hypercholesterolemia 09/10/2013  . Postmenopausal HRT (hormone replacement therapy) 09/10/2013    Jun Rightmyer W. 11/19/2019, 5:33 PM  Frazier Butt., PT   [facility] [address] [place], [place], [address] Phone: [phone]   Fax:  [phone]  Name: Tracy Bernard MRN: [id number] Date of Birth: 1931-06-03

## 2019-11-23 ENCOUNTER — Ambulatory Visit: Payer: Medicare Other | Admitting: Physical Therapy

## 2019-11-23 DIAGNOSIS — E782 Mixed hyperlipidemia: Secondary | ICD-10-CM | POA: Diagnosis not present

## 2019-11-23 DIAGNOSIS — I10 Essential (primary) hypertension: Secondary | ICD-10-CM | POA: Diagnosis not present

## 2019-11-23 DIAGNOSIS — G20 Parkinson's disease: Secondary | ICD-10-CM | POA: Diagnosis not present

## 2019-11-23 DIAGNOSIS — M150 Primary generalized (osteo)arthritis: Secondary | ICD-10-CM | POA: Diagnosis not present

## 2019-11-23 DIAGNOSIS — H409 Unspecified glaucoma: Secondary | ICD-10-CM | POA: Diagnosis not present

## 2019-11-23 DIAGNOSIS — N183 Chronic kidney disease, stage 3 unspecified: Secondary | ICD-10-CM | POA: Diagnosis not present

## 2019-11-27 ENCOUNTER — Encounter: Payer: Self-pay | Admitting: Physical Therapy

## 2019-11-27 ENCOUNTER — Ambulatory Visit: Payer: Medicare Other | Admitting: Physical Therapy

## 2019-11-27 ENCOUNTER — Other Ambulatory Visit: Payer: Self-pay

## 2019-11-27 DIAGNOSIS — R2681 Unsteadiness on feet: Secondary | ICD-10-CM

## 2019-11-27 DIAGNOSIS — R293 Abnormal posture: Secondary | ICD-10-CM | POA: Diagnosis not present

## 2019-11-27 DIAGNOSIS — R2689 Other abnormalities of gait and mobility: Secondary | ICD-10-CM | POA: Diagnosis not present

## 2019-11-27 NOTE — Therapy (Signed)
[facility] [address] [place], [place], [address] Phone: [phone]   Fax:  [phone]  Physical Therapy Treatment  Patient Details  Name: Tracy Bernard MRN: [id number] Date of Birth: 07-31-1931 Referring Provider (PT): Penumalli   Encounter Date: 11/27/2019  PT End of Session - 11/27/19 1027    Visit Number  5    Number of Visits  9    Date for PT Re-Evaluation  01/10/20   pt delayed start in January, due to pt being out of town for holidays   Authorization Type  Medicare, AARP    PT Start Time  1017    PT Stop Time  1058    PT Time Calculation (min)  41 min    Activity Tolerance  Patient tolerated treatment well    Behavior During Therapy  [facility] for tasks assessed/performed       Past Medical History:  Diagnosis Date  . CKD (chronic kidney disease)    "moderate"  . Hyperlipidemia   . Hypertension   . Spinal stenosis     Past Surgical History:  Procedure Laterality Date  . BREAST BIOPSY Left 1977   benign  . CATARACT EXTRACTION, BILATERAL Bilateral 2014  . CHOLECYSTECTOMY, LAPAROSCOPIC  2001  . TONSILLECTOMY    . TOTAL VAGINAL HYSTERECTOMY  1981   secondary to uterine prolapse    There were no vitals filed for this visit.  Subjective Assessment - 11/27/19 1019    Subjective  Just don't feel great, but I'm doing okay.  No falls, no stumbles.    Pertinent History  Spinal stenosis with neurogenic claudication    Limitations  Standing    How long can you stand comfortably?  depends    How long can you walk comfortably?  depends    Patient Stated Goals  Pt's goals for therapy are to help with balance.    Currently in Pain?  No/denies    Pain Onset  More than a month ago                       [facility] Adult PT Treatment/Exercise - 11/27/19 0001      Ambulation/Gait   Ambulation/Gait  Yes    Ambulation/Gait Assistance  7: Independent    Ambulation Distance (Feet)  230 Feet     Assistive device  None    Gait Comments  Moved away from counter, to hallway for dynamic gait activities:  change of speeds, head motions, backwards walking, figure-8's, stepping over obstacles.  Pt independent with no LOB, except for veering with head motions with gait.          Balance Exercises - 11/27/19 1444      Balance Exercises: Standing   Standing Eyes Opened  Wide (BOA);Head turns;5 reps;Foam/compliant surface;Narrow base of support (BOS)   Head nods; head diagonal motions   Stepping Strategy  Anterior;Posterior;Lateral;Foam/compliant surface;UE support;10 reps    Gait with Head Turns  Forward;4 reps  -began at counter in hallway, x 20 ft each, close supervision   Tandem Gait  Forward;Upper extremity support;3 reps   Along counter, solid surface; x 2 reps on red mat surface   Retro Gait  Upper extremity support;3 reps;Foam/compliant surface   Fwd/back walking along counter, red mat   Marching  Solid surface;Foam/compliant surface;Upper extremity assist 1;Forwards;Retro;Other reps (comment)        Standing PWR! Moves Flow: PWR! UP>PWR! Rock>PWR! Twist>PWR! Step, x 5 reps with visual and  verbal cues     PT Long Term Goals - 11/13/19 1259      PT LONG TERM GOAL #1   Title  Pt will be independent with HEP for improved balance and dynamic gait.  TARGET 12/07/2019 (delayed date, due to pt not starting until January 2021 due to being out of town)    Time  4    Period  Weeks    Status  New      PT LONG TERM GOAL #2   Title  Pt will improve Functional Gait Assessment score to at least 20/30 for decreased fall risk.    Time  4    Period  Weeks    Status  New      PT LONG TERM GOAL #3   Title  Pt will verbalize understanding of fall prevention in home environment.    Time  4    Status  New      PT LONG TERM GOAL #4   Title  Pt will ambulate at least 1000, indoors/outdoors surfaces, independently, verbalizing/demonstrating apporpriate strategies to manage back pain  with gait.    Time  4    Period  Weeks    Status  New            Plan - 11/27/19 1500    Clinical Impression Statement  Continued work on compliant surface, dynamic balance exercises, as well as gait and standing PWR! Moves Flow.  Pt has slight veering to R and L with head motions, but otherwise performs activities well without LOB.  Pt will continue to benefit from skilled PT to address high level balance and gait activities.    Personal Factors and Comorbidities  Comorbidity 3+    Comorbidities  HTN, hypercholesterolemia, CKD, spinal stenosis    Examination-Activity Limitations  Locomotion Level    Examination-Participation Restrictions  Community Activity    Stability/Clinical Decision Making  Stable/Uncomplicated    Rehab Potential  Good    PT Frequency  2x / week    PT Duration  4 weeks   plus eval   PT Treatment/Interventions  ADLs/Self Care Home Management;DME Instruction;Balance training;Therapeutic exercise;Therapeutic activities;Functional mobility training;Gait training;Neuromuscular re-education;Patient/family education    PT Next Visit Plan  Continue to work compliant surfaces, head motions, SLS and tandem stance; dynamic balance; PWR! Moves in standing? (perhaps on compliant surface and with FLOW)    Consulted and Agree with Plan of Care  Patient       Patient will benefit from skilled therapeutic intervention in order to improve the following deficits and impairments:  Abnormal gait, Difficulty walking, Decreased balance, Decreased mobility, Postural dysfunction, Impaired flexibility  Visit Diagnosis: Unsteadiness on feet  Other abnormalities of gait and mobility     Problem List Patient Active Problem List   Diagnosis Date Noted  . Vitamin D deficiency 12/17/2014  . Atrophic vaginitis 12/17/2014  . HTN (hypertension) 09/10/2013  . Hypercholesterolemia 09/10/2013  . Postmenopausal HRT (hormone replacement therapy) 09/10/2013    Mane Consolo  W. 11/27/2019, 3:03 PM  Frazier Butt., PT   [facility] [address] [place], [place], [address] Phone: [phone]   Fax:  [phone]  Name: LENNIS DYNES MRN: [id number] Date of Birth: [birth year]

## 2019-11-28 DIAGNOSIS — H401131 Primary open-angle glaucoma, bilateral, mild stage: Secondary | ICD-10-CM | POA: Diagnosis not present

## 2019-11-28 DIAGNOSIS — Z961 Presence of intraocular lens: Secondary | ICD-10-CM | POA: Diagnosis not present

## 2019-11-29 ENCOUNTER — Ambulatory Visit: Payer: Medicare Other | Admitting: Physical Therapy

## 2019-11-29 ENCOUNTER — Other Ambulatory Visit: Payer: Self-pay

## 2019-11-29 DIAGNOSIS — R2681 Unsteadiness on feet: Secondary | ICD-10-CM | POA: Diagnosis not present

## 2019-11-29 DIAGNOSIS — R2689 Other abnormalities of gait and mobility: Secondary | ICD-10-CM

## 2019-11-29 DIAGNOSIS — R293 Abnormal posture: Secondary | ICD-10-CM | POA: Diagnosis not present

## 2019-11-29 NOTE — Therapy (Signed)
[facility] [address] [place], [place], [address] Phone: [phone]   Fax:  [phone]  Physical Therapy Treatment  Patient Details  Name: Tracy Bernard MRN: [id number] Date of Birth: Nov 29, 1930 Referring Provider (PT): Penumalli   Encounter Date: 11/29/2019  PT End of Session - 11/29/19 1153    Visit Number  6    Number of Visits  9    Date for PT Re-Evaluation  01/10/20   pt delayed start in January, due to pt being out of town for holidays   Authorization Type  Medicare, [employer]    PT Start Time  1018    PT Stop Time  1057    PT Time Calculation (min)  39 min    Equipment Utilized During Treatment  Gait belt    Activity Tolerance  Patient tolerated treatment well    Behavior During Therapy  WFL for tasks assessed/performed       Past Medical History:  Diagnosis Date  . CKD (chronic kidney disease)    "moderate"  . Hyperlipidemia   . Hypertension   . Spinal stenosis     Past Surgical History:  Procedure Laterality Date  . BREAST BIOPSY Left 1977   benign  . CATARACT EXTRACTION, BILATERAL Bilateral 2014  . CHOLECYSTECTOMY, LAPAROSCOPIC  2001  . TONSILLECTOMY    . TOTAL VAGINAL HYSTERECTOMY  1981   secondary to uterine prolapse    There were no vitals filed for this visit.  Subjective Assessment - 11/29/19 1019    Subjective  Nothing new.  Same old things. No falls.    Pertinent History  Spinal stenosis with neurogenic claudication    Limitations  Standing    How long can you stand comfortably?  depends    How long can you walk comfortably?  depends    Patient Stated Goals  Pt's goals for therapy are to help with balance.    Currently in Pain?  No/denies    Pain Onset  More than a month ago                       [facility] Adult PT Treatment/Exercise - 11/29/19 0001      Ambulation/Gait   Ambulation/Gait  Yes    Ambulation/Gait Assistance  7: Independent    Ambulation Distance  (Feet)  600 Feet   then 250   Assistive device  None    Gait Pattern  Step-through pattern;Narrow base of support;Decreased step length - right;Decreased step length - left;Trunk flexed    Gait Comments  Initial cues for upright posture, use of visual target.  Quick stop/starts with gait, quick change of direction forward/back, cues for head turns for environmental scanning tasks.  Gait slowed with environmental scanning.          Balance Exercises - 11/29/19 1019      Balance Exercises: Standing   Stepping Strategy  Anterior;Posterior;Lateral;Foam/compliant surface;UE support;10 reps   UE support in parallel bars   Rockerboard  Anterior/posterior;Lateral;Head turns;5 reps   Head nods; hip/ankle strategy, UE lifts, arm swing x 10 reps   Balance Beam  Forward/back stepping on beam, x 10 reps, UE support in parallel bars    Tandem Gait  Forward;Retro;Upper extremity support;Foam/compliant surface;3 reps   In parallel bars   Retro Gait  Foam/compliant surface;3 reps   Forward/back in parallel bars; on solid surface x 10-15 ft   Sidestepping  Foam/compliant support;Upper extremity support;3 reps   R and  L in parallel bars   Marching  Foam/compliant surface;Upper extremity assist 2;10 reps    Heel Raises  Both;10 reps   On Airex   Toe Raise  Both;10 reps   On Airex   Other Standing Exercises  On Airex:  Lateral weightshifting x 10 reps; Standing on incline/decline blue mat surface:  feet apart with head turns/head nods, then alternating forward step taps (standing on incline, pt has posterior tendency-improves with upright posture.)  On incline/decline:  stagger stance with head turns, nods x 5 reps, then with postural cues for upright posture:  arm swing x 10 reps.  PT provides min guard/at times min assist on incline/decline.             PT Long Term Goals - 11/13/19 1259      PT LONG TERM GOAL #1   Title  Pt will be independent with HEP for improved balance and dynamic gait.   TARGET 12/07/2019 (delayed date, due to pt not starting until January 2021 due to being out of town)    Time  4    Period  Weeks    Status  New      PT LONG TERM GOAL #2   Title  Pt will improve Functional Gait Assessment score to at least 20/30 for decreased fall risk.    Time  4    Period  Weeks    Status  New      PT LONG TERM GOAL #3   Title  Pt will verbalize understanding of fall prevention in home environment.    Time  4    Status  New      PT LONG TERM GOAL #4   Title  Pt will ambulate at least 1000, indoors/outdoors surfaces, independently, verbalizing/demonstrating apporpriate strategies to manage back pain with gait.    Time  4    Period  Weeks    Status  New            Plan - 11/29/19 1153    Clinical Impression Statement  Continued work during skilled PT session on compliant surfaces, dynamic balance.  Pt has the most difficulty with compliant surface standing on incline (with noted posterior lean and assist by PT to correct-better with upright posture).  Pt will continue to benefit from skilled PT to address high level balance and gait activities.    Personal Factors and Comorbidities  Comorbidity 3+    Comorbidities  HTN, hypercholesterolemia, CKD, spinal stenosis    Examination-Activity Limitations  Locomotion Level    Examination-Participation Restrictions  Community Activity    Stability/Clinical Decision Making  Stable/Uncomplicated    Rehab Potential  Good    PT Frequency  2x / week    PT Duration  4 weeks   plus eval   PT Treatment/Interventions  ADLs/Self Care Home Management;DME Instruction;Balance training;Therapeutic exercise;Therapeutic activities;Functional mobility training;Gait training;Neuromuscular re-education;Patient/family education    PT Next Visit Plan  Next week is 4th week in POC and need to check LTGs-discuss d/c.    Consulted and Agree with Plan of Care  Patient       Patient will benefit from skilled therapeutic intervention in  order to improve the following deficits and impairments:  Abnormal gait, Difficulty walking, Decreased balance, Decreased mobility, Postural dysfunction, Impaired flexibility  Visit Diagnosis: Unsteadiness on feet  Other abnormalities of gait and mobility     Problem List Patient Active Problem List   Diagnosis Date Noted  . Vitamin D deficiency 12/17/2014  .  Atrophic vaginitis 12/17/2014  . HTN (hypertension) 09/10/2013  . Hypercholesterolemia 09/10/2013  . Postmenopausal HRT (hormone replacement therapy) 09/10/2013    Jef Futch W. 11/29/2019, 11:57 AM  Frazier Butt., PT   [facility] [address] [place], [place], [address] Phone: [phone]   Fax:  [phone]  Name: KRIPA PULLIUM MRN: [id number] Date of Birth: February 01, 1931

## 2019-12-04 ENCOUNTER — Other Ambulatory Visit: Payer: Self-pay

## 2019-12-04 ENCOUNTER — Ambulatory Visit: Payer: Medicare Other | Admitting: Physical Therapy

## 2019-12-04 DIAGNOSIS — R2689 Other abnormalities of gait and mobility: Secondary | ICD-10-CM

## 2019-12-04 DIAGNOSIS — R2681 Unsteadiness on feet: Secondary | ICD-10-CM

## 2019-12-04 DIAGNOSIS — R293 Abnormal posture: Secondary | ICD-10-CM | POA: Diagnosis not present

## 2019-12-04 NOTE — Therapy (Signed)
[facility] [address] [place], [place], [address] Phone: [phone]   Fax:  [phone]  Physical Therapy Treatment/Discharge Summary  Patient Details  Name: Tracy Bernard MRN: [id number] Date of Birth: [birth year] Referring Provider (PT): Penumalli   Encounter Date: 12/04/2019  PT End of Session - 12/04/19 1247    Visit Number  7    Number of Visits  9    Date for PT Re-Evaluation  01/10/20   pt delayed start in January, due to pt being out of town for holidays   Authorization Type  Medicare, AARP    PT Start Time  1020    PT Stop Time  1103    PT Time Calculation (min)  43 min    Equipment Utilized During Treatment  Gait belt    Activity Tolerance  Patient tolerated treatment well    Behavior During Therapy  WFL for tasks assessed/performed       Past Medical History:  Diagnosis Date  . CKD (chronic kidney disease)    "moderate"  . Hyperlipidemia   . Hypertension   . Spinal stenosis     Past Surgical History:  Procedure Laterality Date  . BREAST BIOPSY Left 1977   benign  . CATARACT EXTRACTION, BILATERAL Bilateral 2014  . CHOLECYSTECTOMY, LAPAROSCOPIC  2001  . TONSILLECTOMY    . TOTAL VAGINAL HYSTERECTOMY  1981   secondary to uterine prolapse    There were no vitals filed for this visit.  Subjective Assessment - 12/04/19 1022    Subjective  Doing pretty well.  Have had a few episodes of dizziness.  Not sure if it's related to my blood pressure.  Doesn't last a long time.    Pertinent History  Spinal stenosis with neurogenic claudication    Limitations  Standing    How long can you stand comfortably?  depends    How long can you walk comfortably?  depends    Patient Stated Goals  Pt's goals for therapy are to help with balance.    Currently in Pain?  No/denies    Pain Onset  More than a month ago         [facility] PT Assessment - 12/04/19 0001      Functional Gait  Assessment   Gait  assessed   Yes    Gait Level Surface  Walks 20 ft in less than 5.5 sec, no assistive devices, good speed, no evidence for imbalance, normal gait pattern, deviates no more than 6 in outside of the 12 in walkway width.    Change in Gait Speed  Able to smoothly change walking speed without loss of balance or gait deviation. Deviate no more than 6 in outside of the 12 in walkway width.    Gait with Horizontal Head Turns  Performs head turns smoothly with slight change in gait velocity (eg, minor disruption to smooth gait path), deviates 6-10 in outside 12 in walkway width, or uses an assistive device.    Gait with Vertical Head Turns  Performs head turns with no change in gait. Deviates no more than 6 in outside 12 in walkway width.    Gait and Pivot Turn  Pivot turns safely within 3 sec and stops quickly with no loss of balance.    Step Over Obstacle  Is able to step over one shoe box (4.5 in total height) without changing gait speed. No evidence of imbalance.    Gait with Narrow Base of Support  Ambulates 7-9 steps.    Gait with Eyes Closed  Walks 20 ft, slow speed, abnormal gait pattern, evidence for imbalance, deviates 10-15 in outside 12 in walkway width. Requires more than 9 sec to ambulate 20 ft.   9.63   Ambulating Backwards  Walks 20 ft, uses assistive device, slower speed, mild gait deviations, deviates 6-10 in outside 12 in walkway width.   14.27   Steps  Alternating feet, must use rail.    Total Score  23    FGA comment:  Improved from 17/30                   [facility] Adult PT Treatment/Exercise - 12/04/19 0001      Transfers   Transfers  Sit to Stand;Stand to Sit    Sit to Stand  6: Modified independent (Device/Increase time);Without upper extremity assist;From chair/3-in-1    Five time sit to stand comments   9.22    Stand to Sit  6: Modified independent (Device/Increase time);Without upper extremity assist;To chair/3-in-1      Ambulation/Gait   Ambulation/Gait  Yes     Ambulation/Gait Assistance  7: Independent    Ambulation Distance (Feet)  1089 Feet    Assistive device  None    Gait Pattern  Step-through pattern;Narrow base of support;Decreased step length - right;Decreased step length - left;Trunk flexed    Ambulation Surface  Level;Indoor   Rainy today-unable to go outside   Gait velocity  10.09 sec= 3.25 ft/sec      Standardized Balance Assessment   Standardized Balance Assessment  Timed Up and Go Test      Timed Up and Go Test   TUG  Normal TUG    Normal TUG (seconds)  9.25      Self-Care   Self-Care  Other Self-Care Comments    Other Self-Care Comments   Fall prevention education, progress towards goals, plans for d/c (early) today due to pt's progress          Balance Exercises - 12/04/19 1029      Balance Exercises: Standing   Standing Eyes Opened  Wide (BOA);Head turns;5 reps;Foam/compliant surface;Narrow base of support (BOS)   Head nods; REview of HEP-pt return demo understanding   Standing Eyes Closed  Wide (BOA);Solid surface;Narrow base of support (BOS);Foam/compliant surface;5 reps;Head turns   HEad nods; review of HEP-pt return demo understanding   Partial Tandem Stance  Eyes open;Foam/compliant surface;5 reps;Eyes closed   Head turns, nods      Review of HEP: Single Leg Stance with Support - 3 reps - 1 sets - 10 sec hold - 1x daily - 5x weekly Standing Tandem Balance with Counter Support - 3 reps - 1 sets - 10 sec hold - 1x daily - 5x weekly Backward Walking with Counter Support - 3 reps - 1 sets - 1x daily - 5x weekly   Pt return demo understanding of full HEP.  Also performed along counter forward walking, x 4 reps with head turns, then x 4 reps with head nods, intermittent UE support.  PT Education - 12/04/19 1246    Education Details  Fall prevention, progress towards goals, plans for return screen in 6 months    Person(s) Educated  Patient    Methods  Explanation;Handout    Comprehension  Verbalized  understanding          PT Long Term Goals - 12/04/19 1035      PT LONG TERM GOAL #1   Title  Pt will be independent with HEP for improved balance and dynamic gait.  TARGET 12/07/2019 (delayed date, due to pt not starting until January 2021 due to being out of town)    Time  4    Period  Weeks    Status  Achieved      PT LONG TERM GOAL #2   Title  Pt will improve Functional Gait Assessment score to at least 20/30 for decreased fall risk.    Baseline  23/30 12/04/2019    Time  4    Period  Weeks    Status  Achieved      PT LONG TERM GOAL #3   Title  Pt will verbalize understanding of fall prevention in home environment.    Time  4    Status  Achieved      PT LONG TERM GOAL #4   Title  Pt will ambulate at least 1000, indoors/outdoors surfaces, independently, verbalizing/demonstrating apporpriate strategies to manage back pain with gait.    Time  4    Period  Weeks    Status  Achieved            Plan - 12/04/19 1247    Clinical Impression Statement  Assessed LTGs this visit, with pt meeting all LTGs.  She has demonstrated understanding and independence with HEP, improved functional gait and balance measures.  She is appropriate for d/c from PT this visit.    Personal Factors and Comorbidities  Comorbidity 3+    Comorbidities  HTN, hypercholesterolemia, CKD, spinal stenosis    Examination-Activity Limitations  Locomotion Level    Examination-Participation Restrictions  Community Activity    Stability/Clinical Decision Making  Stable/Uncomplicated    Rehab Potential  Good    PT Frequency  2x / week    PT Duration  4 weeks   plus eval   PT Treatment/Interventions  ADLs/Self Care Home Management;DME Instruction;Balance training;Therapeutic exercise;Therapeutic activities;Functional mobility training;Gait training;Neuromuscular re-education;Patient/family education    PT Next Visit Plan  D/C PT this visit.    Consulted and Agree with Plan of Care  Patient       Patient  will benefit from skilled therapeutic intervention in order to improve the following deficits and impairments:  Abnormal gait, Difficulty walking, Decreased balance, Decreased mobility, Postural dysfunction, Impaired flexibility  Visit Diagnosis: Other abnormalities of gait and mobility  Unsteadiness on feet     Problem List Patient Active Problem List   Diagnosis Date Noted  . Vitamin D deficiency 12/17/2014  . Atrophic vaginitis 12/17/2014  . HTN (hypertension) 09/10/2013  . Hypercholesterolemia 09/10/2013  . Postmenopausal HRT (hormone replacement therapy) 09/10/2013    Noralyn Karim W. 12/04/2019, 12:49 PM Frazier Butt., PT  [facility] [address] [place], [place], [address] Phone: [phone]   Fax:  [phone]  Name: TISHAWNA LAROUCHE MRN: [id number] Date of Birth: 1931-10-26   PHYSICAL THERAPY DISCHARGE SUMMARY  Visits from Start of Care: 7  Current functional level related to goals / functional outcomes: PT Long Term Goals - 12/04/19 1035      PT LONG TERM GOAL #1   Title  Pt will be independent with HEP for improved balance and dynamic gait.  TARGET 12/07/2019 (delayed date, due to pt not starting until January 2021 due to being out of town)    Time  4    Period  Weeks    Status  Achieved      PT LONG TERM GOAL #2  Title  Pt will improve Functional Gait Assessment score to at least 20/30 for decreased fall risk.    Baseline  23/30 12/04/2019    Time  4    Period  Weeks    Status  Achieved      PT LONG TERM GOAL #3   Title  Pt will verbalize understanding of fall prevention in home environment.    Time  4    Status  Achieved      PT LONG TERM GOAL #4   Title  Pt will ambulate at least 1000, indoors/outdoors surfaces, independently, verbalizing/demonstrating apporpriate strategies to manage back pain with gait.    Time  4    Period  Weeks    Status  Achieved      Pt has met all LTGs.    Remaining deficits: Back pain (longstanding) with longer distance gait and standing activities   Education / Equipment: Educated in [employer], fall prevention.  Plan: Patient agrees to discharge.  Patient goals were met. Patient is being discharged due to meeting the stated rehab goals.  ?????Recommend PT screen in 6-9 months due to progressive nature of disease.    Mady Haagensen, PT 12/04/19 12:52 PM Phone: [phone] Fax: [phone]

## 2019-12-04 NOTE — Patient Instructions (Signed)

## 2019-12-06 ENCOUNTER — Ambulatory Visit: Payer: Medicare Other | Admitting: Physical Therapy

## 2019-12-07 DIAGNOSIS — L821 Other seborrheic keratosis: Secondary | ICD-10-CM | POA: Diagnosis not present

## 2019-12-07 DIAGNOSIS — D1801 Hemangioma of skin and subcutaneous tissue: Secondary | ICD-10-CM | POA: Diagnosis not present

## 2019-12-07 DIAGNOSIS — L820 Inflamed seborrheic keratosis: Secondary | ICD-10-CM | POA: Diagnosis not present

## 2019-12-07 DIAGNOSIS — D225 Melanocytic nevi of trunk: Secondary | ICD-10-CM | POA: Diagnosis not present

## 2019-12-07 DIAGNOSIS — L814 Other melanin hyperpigmentation: Secondary | ICD-10-CM | POA: Diagnosis not present

## 2019-12-07 DIAGNOSIS — Z85828 Personal history of other malignant neoplasm of skin: Secondary | ICD-10-CM | POA: Diagnosis not present

## 2019-12-07 DIAGNOSIS — L570 Actinic keratosis: Secondary | ICD-10-CM | POA: Diagnosis not present

## 2019-12-08 ENCOUNTER — Ambulatory Visit: Payer: Medicare Other

## 2019-12-14 ENCOUNTER — Ambulatory Visit: Payer: Medicare Other | Admitting: Physical Therapy

## 2019-12-15 ENCOUNTER — Ambulatory Visit: Payer: Medicare Other | Attending: Internal Medicine

## 2019-12-15 DIAGNOSIS — Z23 Encounter for immunization: Secondary | ICD-10-CM | POA: Insufficient documentation

## 2019-12-15 NOTE — Progress Notes (Signed)
   Covid-19 Vaccination Clinic  Name:  Tracy Bernard    MRN: [id number] DOB: 02-18-1931  12/15/2019  [name] was observed post Covid-19 immunization for 15 minutes without incidence. She was provided with Vaccine Information Sheet and instruction to access the V-Safe system.   [name] was instructed to call 911 with any severe reactions post vaccine: Marland Kitchen Difficulty breathing  . Swelling of your face and throat  . A fast heartbeat  . A bad rash all over your body  . Dizziness and weakness    Immunizations Administered    Name Date Dose VIS Date Route   Pfizer COVID-19 Vaccine 12/15/2019  4:30 PM 0.3 mL 10/19/2019 Intramuscular   Manufacturer: [place]   Lot: [id number]   [place]: [id number]

## 2019-12-19 ENCOUNTER — Ambulatory Visit: Payer: Medicare Other

## 2019-12-28 DIAGNOSIS — H409 Unspecified glaucoma: Secondary | ICD-10-CM | POA: Diagnosis not present

## 2019-12-28 DIAGNOSIS — I10 Essential (primary) hypertension: Secondary | ICD-10-CM | POA: Diagnosis not present

## 2019-12-28 DIAGNOSIS — G20 Parkinson's disease: Secondary | ICD-10-CM | POA: Diagnosis not present

## 2019-12-28 DIAGNOSIS — M150 Primary generalized (osteo)arthritis: Secondary | ICD-10-CM | POA: Diagnosis not present

## 2019-12-28 DIAGNOSIS — N183 Chronic kidney disease, stage 3 unspecified: Secondary | ICD-10-CM | POA: Diagnosis not present

## 2019-12-28 DIAGNOSIS — E782 Mixed hyperlipidemia: Secondary | ICD-10-CM | POA: Diagnosis not present

## 2020-01-09 ENCOUNTER — Ambulatory Visit: Payer: Medicare Other | Attending: Internal Medicine

## 2020-01-09 DIAGNOSIS — Z23 Encounter for immunization: Secondary | ICD-10-CM | POA: Insufficient documentation

## 2020-01-09 NOTE — Progress Notes (Signed)
   Covid-19 Vaccination Clinic  Name:  Tracy Bernard    MRN: [id number] DOB: 23-Aug-1931  01/09/2020  [name] was observed post Covid-19 immunization for 15 minutes without incident. She was provided with Vaccine Information Sheet and instruction to access the V-Safe system.   [name] was instructed to call 911 with any severe reactions post vaccine: Marland Kitchen Difficulty breathing  . Swelling of face and throat  . A fast heartbeat  . A bad rash all over body  . Dizziness and weakness   Immunizations Administered    Name Date Dose VIS Date Route   Pfizer COVID-19 Vaccine 01/09/2020  1:01 PM 0.3 mL 10/19/2019 Intramuscular   Manufacturer: [place]   Lot: [id number]   [place]: [id number]

## 2020-02-05 DIAGNOSIS — G20 Parkinson's disease: Secondary | ICD-10-CM | POA: Diagnosis not present

## 2020-02-05 DIAGNOSIS — M150 Primary generalized (osteo)arthritis: Secondary | ICD-10-CM | POA: Diagnosis not present

## 2020-02-05 DIAGNOSIS — H409 Unspecified glaucoma: Secondary | ICD-10-CM | POA: Diagnosis not present

## 2020-02-05 DIAGNOSIS — N183 Chronic kidney disease, stage 3 unspecified: Secondary | ICD-10-CM | POA: Diagnosis not present

## 2020-02-05 DIAGNOSIS — E782 Mixed hyperlipidemia: Secondary | ICD-10-CM | POA: Diagnosis not present

## 2020-02-05 DIAGNOSIS — I10 Essential (primary) hypertension: Secondary | ICD-10-CM | POA: Diagnosis not present

## 2020-02-26 DIAGNOSIS — H401131 Primary open-angle glaucoma, bilateral, mild stage: Secondary | ICD-10-CM | POA: Diagnosis not present

## 2020-03-01 IMAGING — NM NM BONE WHOLE BODY
2 series · 2 of 2 positions shown · non-contrast
Comparison: None.

CLINICAL DATA: Chronic low back pain radiating down the right leg
for 1 year. Fall 1 week ago injuring the left hip.

EXAM:
NUCLEAR MEDICINE WHOLE BODY BONE SCAN
TECHNIQUE: Whole body anterior and posterior images were obtained approximately
3 hours after intravenous injection of radiopharmaceutical.
RADIOPHARMACEUTICALS:  21.5 mCi Nechnetium-55m MDP IV

[Series 1: whole body · 2.66mm/px · 1 of 1 slices shown (1 of 2)]
[im 1/1]
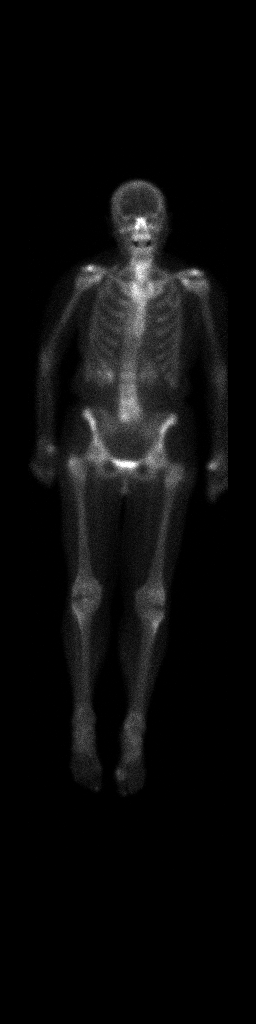

[Series 1: whole body · 2.66mm/px · 1 of 1 slices shown (2 of 2)]
[im 1/1]
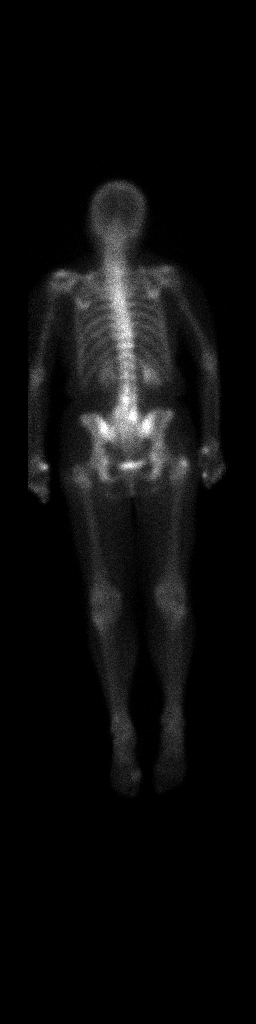

[2 of 2 positions shown; findings below may reference images not displayed]

FINDINGS: There approximately 12 degrees of dextroconvex scoliosis between the
lower thoracic spine and upper lumbar spine.

Degenerative activity at the shoulders, bases of the thumbs, knees,
and left first MTP joint.

Degenerative findings in the lower lumbar spine, likely from facet
arthropathy eccentric to the left at L5-S1, and possibly some
involvement at L4-5 as well.

Focus of accentuated activity along the right femur greater
trochanter is nonspecific, but most likely reactive at the gluteal
insertion site.

We do not demonstrate a compelling pattern for osseous metastatic
disease.
IMPRESSION: 1. Degenerative findings in the lower lumbar spine, shoulders, base
of thumbs, knees, and left first MTP joint.
2. Dextroconvex lower thoracic and lumbar scoliosis.
3. Focal of accentuated activity in the right femur greater
trochanter is nonspecific but likely reactive at the gluteal
insertion site.

## 2020-03-07 DIAGNOSIS — M150 Primary generalized (osteo)arthritis: Secondary | ICD-10-CM | POA: Diagnosis not present

## 2020-03-07 DIAGNOSIS — G20 Parkinson's disease: Secondary | ICD-10-CM | POA: Diagnosis not present

## 2020-03-07 DIAGNOSIS — H409 Unspecified glaucoma: Secondary | ICD-10-CM | POA: Diagnosis not present

## 2020-03-07 DIAGNOSIS — E782 Mixed hyperlipidemia: Secondary | ICD-10-CM | POA: Diagnosis not present

## 2020-03-07 DIAGNOSIS — N183 Chronic kidney disease, stage 3 unspecified: Secondary | ICD-10-CM | POA: Diagnosis not present

## 2020-03-07 DIAGNOSIS — I10 Essential (primary) hypertension: Secondary | ICD-10-CM | POA: Diagnosis not present

## 2020-04-28 ENCOUNTER — Ambulatory Visit (INDEPENDENT_AMBULATORY_CARE_PROVIDER_SITE_OTHER): Payer: Medicare Other | Admitting: Diagnostic Neuroimaging

## 2020-04-28 ENCOUNTER — Encounter: Payer: Self-pay | Admitting: Diagnostic Neuroimaging

## 2020-04-28 VITALS — BP 121/70 | HR 74 | Ht 62.0 in | Wt 118.8 lb

## 2020-04-28 DIAGNOSIS — G20 Parkinson's disease: Secondary | ICD-10-CM | POA: Diagnosis not present

## 2020-04-28 DIAGNOSIS — M48062 Spinal stenosis, lumbar region with neurogenic claudication: Secondary | ICD-10-CM | POA: Diagnosis not present

## 2020-04-28 MED ORDER — CARBIDOPA-LEVODOPA 25-100 MG PO TABS: 1.0000 | ORAL_TABLET | Freq: Three times a day (TID) | ORAL | 4 refills | Status: DC

## 2020-04-28 NOTE — Progress Notes (Signed)
[facility]  PATIENT: Tracy Bernard DOB: [birth year]  REFERRING CLINICIAN: Amedeo Gory HISTORY FROM: patient  REASON FOR VISIT: follow up   HISTORICAL  CHIEF COMPLAINT:  Chief Complaint  Patient presents with  . Parkinson's disease    rm 6, 8 monh FU "did PT which was helpful"    HISTORY OF PRESENT ILLNESS:   UPDATE (04/28/20, VRP): Since last visit, doing well. Symptoms are improved. Severity is mild. No alleviating or aggravating factors. Tolerating carb/levo.    UPDATE (08/27/19, VRP): Since last visit, doing well, except for 1 fall in March 2020 (left knee pain). Getting better. Symptoms are stable. No alleviating or aggravating factors. Tolerating carb/levo.    UPDATE (12/25/18, VRP): Since last visit, doing well. Symptoms are improved on carb/levo (tremor). BP continues to fluctuate. No alleviating or aggravating factors. Tolerating meds.    PRIOR HPI (08/21/28): 86 year old female here for evaluation of balance and tremor problems.  Patient has had progressive resting and postural tremor in the right upper and right lower extremities since 2016.  She has also had balance and gait difficulty in last 6 months, and has had several falls.  Patient lives alone.  Symptoms have been noticed by patient as well as her daughter.  Daughter has also noticed that patient's voice has become "soft".  Patient denies any swallowing problems or sialorrhea.  Patient has had lack of sense of smell for past 20 years.  She has mild constipation.  She has intermittent wild dreams.  She is just had problems with sleepwalking.  She has some insomnia issues.  Patient was diagnosed with possible essential tremor initially by PCP, but due to progression and change of symptoms consideration of Parkinson's disease was made.  Therefore patient was referred here for further evaluation.  No family history of tremor or Parkinson's disease.   REVIEW OF SYSTEMS: Full 14 system review of  systems performed and negative with exception of: as per HPI.   ALLERGIES: Allergies  Allergen Reactions  . Lisinopril Other (See Comments)    cough  . Tramadol Other (See Comments)    Sleepiness, confusion    HOME MEDICATIONS: Outpatient Medications Prior to Visit  Medication Sig Dispense Refill  . amLODipine (NORVASC) 2.5 MG tablet Take 2.5 mg by mouth daily.    . brimonidine (ALPHAGAN) 0.2 % ophthalmic solution Place 1 drop into both eyes 2 (two) times daily.     . Calcium Citrate-Vitamin D (CALCIUM CITRATE + D3) 200-250 MG-UNIT TABS Take 1 tablet by mouth daily.    . carbidopa-levodopa (SINEMET IR) 25-100 MG tablet Take 1 tablet by mouth 3 (three) times daily before meals. 270 tablet 4  . diphenhydramine-acetaminophen (TYLENOL PM) 25-500 MG TABS tablet Take 1 tablet by mouth at bedtime as needed (pain).    . fexofenadine (ALLEGRA) 180 MG tablet Take 180 mg by mouth daily.    . fluticasone (FLONASE) 50 MCG/ACT nasal spray Place 1-2 sprays into the nose daily as needed for allergies or rhinitis.     . folic acid (FOLVITE) 974 MCG tablet Take 400 mcg by mouth daily.    . Glucosamine-Chondroit-Vit C-Mn (GLUCOSAMINE CHONDR 1500 COMPLX) CAPS Take 1 capsule by mouth daily.    Javier Docker Oil (OMEGA-3) 500 MG CAPS Take 1 capsule by mouth daily.    Marland Kitchen latanoprost (XALATAN) 0.005 % ophthalmic solution Place 1 drop into both eyes at bedtime.    Marland Kitchen losartan (COZAAR) 50 MG tablet Take 50 mg by mouth daily.    Marland Kitchen  Multiple Vitamin (MULTIVITAMIN WITH MINERALS) TABS tablet Take 1 tablet by mouth daily.    Marland Kitchen omeprazole (PRILOSEC) 20 MG capsule Take 20 mg by mouth daily as needed (acid reflux).     . polycarbophil (FIBERCON) 625 MG tablet Take 625 mg by mouth daily.    Marland Kitchen saccharomyces boulardii (FLORASTOR) 250 MG capsule Take 250 mg by mouth daily.    . simvastatin (ZOCOR) 20 MG tablet Take 10 mg by mouth every evening.     . vitamin B-12 (CYANOCOBALAMIN) 1000 MCG tablet Take 1,000 mcg by mouth as  directed. 3 times weekly.  Monday, Wednesday, Friday    . vitamin C (ASCORBIC ACID) 500 MG tablet Take 500 mg by mouth daily.    . vitamin E 400 UNIT capsule Take 400 Units by mouth daily.    Marland Kitchen HYDROcodone-acetaminophen (NORCO/VICODIN) 5-325 MG tablet Take 1 tablet by mouth every 6 (six) hours as needed. (Patient not taking: Reported on 04/28/2020) 10 tablet 0   No facility-administered medications prior to visit.    PAST MEDICAL HISTORY: Past Medical History:  Diagnosis Date  . CKD (chronic kidney disease)    "moderate"  . Hyperlipidemia   . Hypertension   . Spinal stenosis     PAST SURGICAL HISTORY: Past Surgical History:  Procedure Laterality Date  . BREAST BIOPSY Left 1977   benign  . CATARACT EXTRACTION, BILATERAL Bilateral 2014  . CHOLECYSTECTOMY, LAPAROSCOPIC  2001  . TONSILLECTOMY    . TOTAL VAGINAL HYSTERECTOMY  1981   secondary to uterine prolapse    FAMILY HISTORY: Family History  Problem Relation Age of Onset  . Breast cancer Sister   . Stroke Sister   . Kidney failure Mother   . Hypertension Mother   . Kidney failure Father   . Hypertension Father   . Colon cancer Daughter 71       resection of colon, had chemo and is now cancer free. at age 57 -2018  . Anuerysm Daughter 49  . Diabetes Daughter 47       treaated with oral med    SOCIAL HISTORY: Social History   Socioeconomic History  . Marital status: Widowed    Spouse name: Not on file  . Number of children: 1  . Years of education: 19  . Highest education level: Not on file  Occupational History  . Not on file  Tobacco Use  . Smoking status: Never Smoker  . Smokeless tobacco: Never Used  Substance and Sexual Activity  . Alcohol use: Yes    Alcohol/week: 2.0 standard drinks    Types: 2 Standard drinks or equivalent per week    Comment: wine on Friday nights  . Drug use: No  . Sexual activity: Never    Partners: Male    Birth control/protection: Surgical    Comment: hysterectomy    Other Topics Concern  . Not on file  Social History Narrative   04/28/20 Lives alone, daughter lives at [place].  Good neighbor support.  Husband passed at age 78 from heart disease and also was diabetic.   Caffeine- coffee, 1 cup daily, maybe occas soda   Social Determinants of Health   Financial Resource Strain:   . Difficulty of Paying Living Expenses:   Food Insecurity:   . Worried About Charity fundraiser in the Last Year:   . Arboriculturist in the Last Year:   Transportation Needs:   . Film/video editor (Medical):   Marland Kitchen Lack of Transportation (  Non-Medical):   Physical Activity:   . Days of Exercise per Week:   . Minutes of Exercise per Session:   Stress:   . Feeling of Stress :   Social Connections:   . Frequency of Communication with Friends and Family:   . Frequency of Social Gatherings with Friends and Family:   . Attends Religious Services:   . Active Member of Clubs or Organizations:   . Attends Archivist Meetings:   Marland Kitchen Marital Status:   Intimate Partner Violence:   . Fear of Current or Ex-Partner:   . Emotionally Abused:   Marland Kitchen Physically Abused:   . Sexually Abused:     PHYSICAL EXAM   GENERAL EXAM/CONSTITUTIONAL: Vitals:  Vitals:   04/28/20 1310  BP: 121/70  Pulse: 74  Weight: 118 lb 12.8 oz (53.9 kg)  Height: 5\' 2"  (1.575 m)   Body mass index is 21.73 kg/m. Wt Readings from Last 3 Encounters:  04/28/20 118 lb 12.8 oz (53.9 kg)  08/27/19 115 lb 12.8 oz (52.5 kg)  12/25/18 118 lb 6.4 oz (53.7 kg)    Patient is in no distress; well developed, nourished and groomed; neck is supple  CARDIOVASCULAR:  Examination of carotid arteries is normal; no carotid bruits  Regular rate and rhythm, no murmurs  Examination of peripheral vascular system by observation and palpation is normal  EYES:  Ophthalmoscopic exam of optic discs and posterior segments is normal; no papilledema or hemorrhages No exam data  present  MUSCULOSKELETAL:  Gait, strength, tone, movements noted in Neurologic exam below  NEUROLOGIC: MENTAL STATUS:  No flowsheet data found.  awake, alert, oriented to person, place and time  recent and remote memory intact  normal attention and concentration  language fluent, comprehension intact, naming intact  fund of knowledge appropriate  CRANIAL NERVE:   2nd - no papilledema on fundoscopic exam  2nd, 3rd, 4th, 6th - pupils equal and reactive to light, visual fields full to confrontation, extraocular muscles intact, no nystagmus  5th - facial sensation symmetric  7th - facial strength symmetric  8th - hearing intact  9th - palate elevates symmetrically, uvula midline  11th - shoulder shrug symmetric  12th - tongue protrusion midline  HYPOMIMIA  SOFT VOICE  MOTOR:   NO TREMOR   NO COGWHEELING RIGIDITY  MILD BRADYKINESIA IN BUE / BLE  full strength in the BUE, BLE; EXCEPT DECR RIGHT FOOT DF  SENSORY:   normal and symmetric to light touch  ABSENT VIB AT ANKLES AND TOES  COORDINATION:   finger-nose-finger, fine finger movements SLOW  REFLEXES:   deep tendon reflexes TRACE and symmetric  GAIT/STATION:   DECR RIGHT ARM SWING WITH WALKING  SLIGHTLY STOOPED POSTURE  SMOOTH STRIDE, SHORT STEPS  SMOOTH TURNING     DIAGNOSTIC DATA (LABS, IMAGING, TESTING) - I reviewed patient records, labs, notes, testing and imaging myself where available.  No results found for: WBC, HGB, HCT, MCV, PLT No results found for: NA, K, CL, CO2, GLUCOSE, BUN, CREATININE, CALCIUM, PROT, ALBUMIN, AST, ALT, ALKPHOS, BILITOT, GFRNONAA, GFRAA No results found for: CHOL, HDL, LDLCALC, LDLDIRECT, TRIG, CHOLHDL No results found for: HGBA1C No results found for: VITAMINB12 No results found for: TSH   07/12/18 MRI lumbar spine [I reviewed images myself. -VRP]  - At L4-5: severe spinal stenosis and severe biforaminal stenosis; anterior spondylolisthesis of L4 on  L5 (97mm) - At L3-4: moderate spinal stenosis   07/26/18 NM bone scan 1. Degenerative findings in the lower lumbar spine,  shoulders, base of thumbs, knees, and left first MTP joint. 2. Dextroconvex lower thoracic and lumbar scoliosis. 3. Focal of accentuated activity in the right femur greater trochanter is nonspecific but likely reactive at the gluteal insertion site.  09/08/18 MRI brain [I reviewed images myself and agree with interpretation. -VRP]  1.     Mild generalized cortical atrophy that is moderate in the mesial temporal lobes 2.     Mild chronic microvascular ischemic changes in the hemispheres. 3.     Chronic sphenoid sinusitis. 4.     There are no acute findings.    ASSESSMENT AND PLAN  86 y.o. year old female here with gradual onset progressive resting tremor, cogwheel rigidity, bradykinesia, balance difficulty, hypomania, lack of sense of smell, with signs and symptoms most consistent with idiopathic Parkinson's disease.  Patient also has history of lumbar spinal stenosis with neurogenic claudication, currently being managed conservatively.  Reviewed diagnosis, prognosis and treatment options.  Reviewed community resources with patient.  Dx:  1. Parkinson's disease ([place])   2. Spinal stenosis of lumbar region with neurogenic claudication     PLAN:  PARKINSON'S DISEASE - continue carb/levo 25/100 1 tab three times a day; take 30 minutes before meals - continue PT exercises  LUMBAR SPINAL STENOSIS - conservative mgmt per neurosurgery  Meds ordered this encounter  Medications  . carbidopa-levodopa (SINEMET IR) 25-100 MG tablet    Sig: Take 1 tablet by mouth 3 (three) times daily before meals.    Dispense:  270 tablet    Refill:  4   Return in about 1 year (around 04/28/2021) for with NP (Amy Lomax).   Penni Bombard, MD 06/02/2034, 5:97 PM Certified in Neurology, Neurophysiology and Neuroimaging  [facility] [address] [place], [place] [address] [phone]

## 2020-04-29 ENCOUNTER — Ambulatory Visit: Payer: Medicare Other | Admitting: Physical Therapy

## 2020-04-29 ENCOUNTER — Ambulatory Visit: Payer: Medicare Other

## 2020-05-26 DIAGNOSIS — H401131 Primary open-angle glaucoma, bilateral, mild stage: Secondary | ICD-10-CM | POA: Diagnosis not present

## 2020-06-03 DIAGNOSIS — M150 Primary generalized (osteo)arthritis: Secondary | ICD-10-CM | POA: Diagnosis not present

## 2020-06-03 DIAGNOSIS — G20 Parkinson's disease: Secondary | ICD-10-CM | POA: Diagnosis not present

## 2020-06-03 DIAGNOSIS — E782 Mixed hyperlipidemia: Secondary | ICD-10-CM | POA: Diagnosis not present

## 2020-06-03 DIAGNOSIS — H409 Unspecified glaucoma: Secondary | ICD-10-CM | POA: Diagnosis not present

## 2020-06-03 DIAGNOSIS — N183 Chronic kidney disease, stage 3 unspecified: Secondary | ICD-10-CM | POA: Diagnosis not present

## 2020-06-03 DIAGNOSIS — I10 Essential (primary) hypertension: Secondary | ICD-10-CM | POA: Diagnosis not present

## 2020-07-09 DIAGNOSIS — Z1231 Encounter for screening mammogram for malignant neoplasm of breast: Secondary | ICD-10-CM | POA: Diagnosis not present

## 2020-08-03 DIAGNOSIS — G20 Parkinson's disease: Secondary | ICD-10-CM | POA: Diagnosis not present

## 2020-08-03 DIAGNOSIS — H409 Unspecified glaucoma: Secondary | ICD-10-CM | POA: Diagnosis not present

## 2020-08-03 DIAGNOSIS — I10 Essential (primary) hypertension: Secondary | ICD-10-CM | POA: Diagnosis not present

## 2020-08-03 DIAGNOSIS — E782 Mixed hyperlipidemia: Secondary | ICD-10-CM | POA: Diagnosis not present

## 2020-08-03 DIAGNOSIS — N1831 Chronic kidney disease, stage 3a: Secondary | ICD-10-CM | POA: Diagnosis not present

## 2020-08-03 DIAGNOSIS — N183 Chronic kidney disease, stage 3 unspecified: Secondary | ICD-10-CM | POA: Diagnosis not present

## 2020-08-03 DIAGNOSIS — M150 Primary generalized (osteo)arthritis: Secondary | ICD-10-CM | POA: Diagnosis not present

## 2020-08-04 DIAGNOSIS — N959 Unspecified menopausal and perimenopausal disorder: Secondary | ICD-10-CM | POA: Diagnosis not present

## 2020-08-04 DIAGNOSIS — H409 Unspecified glaucoma: Secondary | ICD-10-CM | POA: Diagnosis not present

## 2020-08-04 DIAGNOSIS — K219 Gastro-esophageal reflux disease without esophagitis: Secondary | ICD-10-CM | POA: Diagnosis not present

## 2020-08-04 DIAGNOSIS — G20 Parkinson's disease: Secondary | ICD-10-CM | POA: Diagnosis not present

## 2020-08-04 DIAGNOSIS — I10 Essential (primary) hypertension: Secondary | ICD-10-CM | POA: Diagnosis not present

## 2020-08-04 DIAGNOSIS — Z79899 Other long term (current) drug therapy: Secondary | ICD-10-CM | POA: Diagnosis not present

## 2020-08-04 DIAGNOSIS — J309 Allergic rhinitis, unspecified: Secondary | ICD-10-CM | POA: Diagnosis not present

## 2020-08-04 DIAGNOSIS — Z0000 Encounter for general adult medical examination without abnormal findings: Secondary | ICD-10-CM | POA: Diagnosis not present

## 2020-08-04 DIAGNOSIS — E782 Mixed hyperlipidemia: Secondary | ICD-10-CM | POA: Diagnosis not present

## 2020-08-04 DIAGNOSIS — N183 Chronic kidney disease, stage 3 unspecified: Secondary | ICD-10-CM | POA: Diagnosis not present

## 2020-08-04 DIAGNOSIS — M150 Primary generalized (osteo)arthritis: Secondary | ICD-10-CM | POA: Diagnosis not present

## 2020-08-07 DIAGNOSIS — I10 Essential (primary) hypertension: Secondary | ICD-10-CM | POA: Diagnosis not present

## 2020-08-07 DIAGNOSIS — R748 Abnormal levels of other serum enzymes: Secondary | ICD-10-CM | POA: Diagnosis not present

## 2020-08-07 DIAGNOSIS — E782 Mixed hyperlipidemia: Secondary | ICD-10-CM | POA: Diagnosis not present

## 2020-08-07 DIAGNOSIS — E673 Hypervitaminosis D: Secondary | ICD-10-CM | POA: Diagnosis not present

## 2020-08-07 DIAGNOSIS — N1831 Chronic kidney disease, stage 3a: Secondary | ICD-10-CM | POA: Diagnosis not present

## 2020-08-07 DIAGNOSIS — Z01419 Encounter for gynecological examination (general) (routine) without abnormal findings: Secondary | ICD-10-CM | POA: Diagnosis not present

## 2020-08-07 DIAGNOSIS — Z1389 Encounter for screening for other disorder: Secondary | ICD-10-CM | POA: Diagnosis not present

## 2020-08-07 DIAGNOSIS — Z0000 Encounter for general adult medical examination without abnormal findings: Secondary | ICD-10-CM | POA: Diagnosis not present

## 2020-08-07 DIAGNOSIS — Z79899 Other long term (current) drug therapy: Secondary | ICD-10-CM | POA: Diagnosis not present

## 2020-08-07 DIAGNOSIS — G20 Parkinson's disease: Secondary | ICD-10-CM | POA: Diagnosis not present

## 2020-08-07 DIAGNOSIS — K219 Gastro-esophageal reflux disease without esophagitis: Secondary | ICD-10-CM | POA: Diagnosis not present

## 2020-08-07 DIAGNOSIS — Z23 Encounter for immunization: Secondary | ICD-10-CM | POA: Diagnosis not present

## 2020-08-25 DIAGNOSIS — Z23 Encounter for immunization: Secondary | ICD-10-CM | POA: Diagnosis not present

## 2020-08-26 DIAGNOSIS — H401131 Primary open-angle glaucoma, bilateral, mild stage: Secondary | ICD-10-CM | POA: Diagnosis not present

## 2020-09-10 DIAGNOSIS — H409 Unspecified glaucoma: Secondary | ICD-10-CM | POA: Diagnosis not present

## 2020-09-10 DIAGNOSIS — N1831 Chronic kidney disease, stage 3a: Secondary | ICD-10-CM | POA: Diagnosis not present

## 2020-09-10 DIAGNOSIS — K219 Gastro-esophageal reflux disease without esophagitis: Secondary | ICD-10-CM | POA: Diagnosis not present

## 2020-09-10 DIAGNOSIS — M150 Primary generalized (osteo)arthritis: Secondary | ICD-10-CM | POA: Diagnosis not present

## 2020-09-10 DIAGNOSIS — I10 Essential (primary) hypertension: Secondary | ICD-10-CM | POA: Diagnosis not present

## 2020-09-10 DIAGNOSIS — G8929 Other chronic pain: Secondary | ICD-10-CM | POA: Diagnosis not present

## 2020-09-10 DIAGNOSIS — G20 Parkinson's disease: Secondary | ICD-10-CM | POA: Diagnosis not present

## 2020-09-10 DIAGNOSIS — E782 Mixed hyperlipidemia: Secondary | ICD-10-CM | POA: Diagnosis not present

## 2020-09-10 DIAGNOSIS — N183 Chronic kidney disease, stage 3 unspecified: Secondary | ICD-10-CM | POA: Diagnosis not present

## 2020-10-13 DIAGNOSIS — G8929 Other chronic pain: Secondary | ICD-10-CM | POA: Diagnosis not present

## 2020-10-13 DIAGNOSIS — K219 Gastro-esophageal reflux disease without esophagitis: Secondary | ICD-10-CM | POA: Diagnosis not present

## 2020-10-13 DIAGNOSIS — G20 Parkinson's disease: Secondary | ICD-10-CM | POA: Diagnosis not present

## 2020-10-13 DIAGNOSIS — N1831 Chronic kidney disease, stage 3a: Secondary | ICD-10-CM | POA: Diagnosis not present

## 2020-10-13 DIAGNOSIS — N183 Chronic kidney disease, stage 3 unspecified: Secondary | ICD-10-CM | POA: Diagnosis not present

## 2020-10-13 DIAGNOSIS — E782 Mixed hyperlipidemia: Secondary | ICD-10-CM | POA: Diagnosis not present

## 2020-10-13 DIAGNOSIS — H409 Unspecified glaucoma: Secondary | ICD-10-CM | POA: Diagnosis not present

## 2020-10-13 DIAGNOSIS — I10 Essential (primary) hypertension: Secondary | ICD-10-CM | POA: Diagnosis not present

## 2020-10-13 DIAGNOSIS — M150 Primary generalized (osteo)arthritis: Secondary | ICD-10-CM | POA: Diagnosis not present

## 2020-12-11 DIAGNOSIS — H401131 Primary open-angle glaucoma, bilateral, mild stage: Secondary | ICD-10-CM | POA: Diagnosis not present

## 2020-12-11 DIAGNOSIS — Z961 Presence of intraocular lens: Secondary | ICD-10-CM | POA: Diagnosis not present

## 2021-01-14 DIAGNOSIS — M150 Primary generalized (osteo)arthritis: Secondary | ICD-10-CM | POA: Diagnosis not present

## 2021-01-14 DIAGNOSIS — G8929 Other chronic pain: Secondary | ICD-10-CM | POA: Diagnosis not present

## 2021-01-14 DIAGNOSIS — H409 Unspecified glaucoma: Secondary | ICD-10-CM | POA: Diagnosis not present

## 2021-01-14 DIAGNOSIS — N183 Chronic kidney disease, stage 3 unspecified: Secondary | ICD-10-CM | POA: Diagnosis not present

## 2021-01-14 DIAGNOSIS — G20 Parkinson's disease: Secondary | ICD-10-CM | POA: Diagnosis not present

## 2021-01-14 DIAGNOSIS — I10 Essential (primary) hypertension: Secondary | ICD-10-CM | POA: Diagnosis not present

## 2021-01-14 DIAGNOSIS — E782 Mixed hyperlipidemia: Secondary | ICD-10-CM | POA: Diagnosis not present

## 2021-01-14 DIAGNOSIS — N1831 Chronic kidney disease, stage 3a: Secondary | ICD-10-CM | POA: Diagnosis not present

## 2021-01-14 DIAGNOSIS — K219 Gastro-esophageal reflux disease without esophagitis: Secondary | ICD-10-CM | POA: Diagnosis not present

## 2021-02-25 DIAGNOSIS — D485 Neoplasm of uncertain behavior of skin: Secondary | ICD-10-CM | POA: Diagnosis not present

## 2021-02-25 DIAGNOSIS — L821 Other seborrheic keratosis: Secondary | ICD-10-CM | POA: Diagnosis not present

## 2021-02-25 DIAGNOSIS — D1801 Hemangioma of skin and subcutaneous tissue: Secondary | ICD-10-CM | POA: Diagnosis not present

## 2021-02-25 DIAGNOSIS — D0461 Carcinoma in situ of skin of right upper limb, including shoulder: Secondary | ICD-10-CM | POA: Diagnosis not present

## 2021-02-25 DIAGNOSIS — L820 Inflamed seborrheic keratosis: Secondary | ICD-10-CM | POA: Diagnosis not present

## 2021-02-25 DIAGNOSIS — Z85828 Personal history of other malignant neoplasm of skin: Secondary | ICD-10-CM | POA: Diagnosis not present

## 2021-03-11 ENCOUNTER — Telehealth: Payer: Self-pay | Admitting: Diagnostic Neuroimaging

## 2021-03-11 NOTE — Telephone Encounter (Signed)
[facility], spoke with [place] who stated She is not in  their office. She will send Caryl Pina a message to call me.

## 2021-03-11 NOTE — Telephone Encounter (Signed)
Tracy Bernard from [facility] called stating that the provider is wanting to start her on gabapentin 100 mg one at bedtime due to the pt complaining of having issues sleeping and waking up with irritation in her legs. Please advise.

## 2021-03-12 DIAGNOSIS — H401131 Primary open-angle glaucoma, bilateral, mild stage: Secondary | ICD-10-CM | POA: Diagnosis not present

## 2021-03-17 DIAGNOSIS — H409 Unspecified glaucoma: Secondary | ICD-10-CM | POA: Diagnosis not present

## 2021-03-17 DIAGNOSIS — N1831 Chronic kidney disease, stage 3a: Secondary | ICD-10-CM | POA: Diagnosis not present

## 2021-03-17 DIAGNOSIS — N183 Chronic kidney disease, stage 3 unspecified: Secondary | ICD-10-CM | POA: Diagnosis not present

## 2021-03-17 DIAGNOSIS — G20 Parkinson's disease: Secondary | ICD-10-CM | POA: Diagnosis not present

## 2021-03-17 DIAGNOSIS — E782 Mixed hyperlipidemia: Secondary | ICD-10-CM | POA: Diagnosis not present

## 2021-03-17 DIAGNOSIS — K219 Gastro-esophageal reflux disease without esophagitis: Secondary | ICD-10-CM | POA: Diagnosis not present

## 2021-03-17 DIAGNOSIS — I10 Essential (primary) hypertension: Secondary | ICD-10-CM | POA: Diagnosis not present

## 2021-03-17 DIAGNOSIS — M150 Primary generalized (osteo)arthritis: Secondary | ICD-10-CM | POA: Diagnosis not present

## 2021-03-17 DIAGNOSIS — G8929 Other chronic pain: Secondary | ICD-10-CM | POA: Diagnosis not present

## 2021-03-17 NOTE — Telephone Encounter (Signed)
[facility] called back and stated PCP wants to start patient on gabapentin at bedtime for leg pain. I advised her that per Dr Leta Baptist, that is fine. Caryl Pina  verbalized understanding, appreciation.

## 2021-03-24 DIAGNOSIS — J069 Acute upper respiratory infection, unspecified: Secondary | ICD-10-CM | POA: Diagnosis not present

## 2021-03-24 DIAGNOSIS — Z03818 Encounter for observation for suspected exposure to other biological agents ruled out: Secondary | ICD-10-CM | POA: Diagnosis not present

## 2021-03-24 DIAGNOSIS — J029 Acute pharyngitis, unspecified: Secondary | ICD-10-CM | POA: Diagnosis not present

## 2021-03-24 DIAGNOSIS — R0981 Nasal congestion: Secondary | ICD-10-CM | POA: Diagnosis not present

## 2021-04-28 NOTE — Patient Instructions (Signed)
Below is our plan:  We will continue carbidopa/levodopa 25-100mg  three times daily. Reach out to Dr Nelva Bush regarding back pain. Try using your TENS unit. Consider PT if you feel it would be helpful. Consider restarting gabapentin with PCP if you feel it was helpful. I do not have any hesitations to you taking a low dose of gabapentin if it is beneficial.   Please make sure you are staying well hydrated. I recommend 50-60 ounces daily. Well balanced diet and regular exercise encouraged. Consistent sleep schedule with 6-8 hours recommended.   Please continue follow up with care team as directed.   Follow up with me in 1 year   You may receive a survey regarding today's visit. I encourage you to leave honest feed back as I do use this information to improve patient care. Thank you for seeing me today!

## 2021-04-28 NOTE — Progress Notes (Signed)
Chief Complaint  Patient presents with   Follow-up    RM 2, alone. Here for parkinson's f/u, pt reports her back pain sometimes keeps her from doing things. Pt states she very seldom does her exercise.       HISTORY OF PRESENT ILLNESS: 04/29/21 ALL:  Tracy Bernard is a 87 y.o. female here today for follow up for PD. She continues carbidopa/levodopa 25-100mg  TID. She is tolerating medication well. Tremor has resolved. She does report that her daughter asks her to raise her voice when on the phone. She denies changes in gait. May feel a little off balance at times. No falls. No assistive devices. She is eating normally. No trouble swallowing. She continues to have significant back pain due to spinal stenosis. She is followed by Dr Nelva Bush. NS did not feel she was a surgical candidate. She has not been as active. She was started on gabapentin 100-200mg  at bedtime by PCP. She feels it has helped. She participated in PT in the past but was not sure it was helpful.    HISTORY (copied from Dr Gladstone Lighter previous note)  UPDATE (04/28/20, VRP): Since last visit, doing well. Symptoms are improved. Severity is mild. No alleviating or aggravating factors. Tolerating carb/levo.     UPDATE (08/27/19, VRP): Since last visit, doing well, except for 1 fall in March 2020 (left knee pain). Getting better. Symptoms are stable. No alleviating or aggravating factors. Tolerating carb/levo.     UPDATE (12/25/18, VRP): Since last visit, doing well. Symptoms are improved on carb/levo (tremor). BP continues to fluctuate. No alleviating or aggravating factors. Tolerating meds.     PRIOR HPI (08/21/28): 87 year old female here for evaluation of balance and tremor problems.   Patient has had progressive resting and postural tremor in the right upper and right lower extremities since 2016.  She has also had balance and gait difficulty in last 6 months, and has had several falls.  Patient lives alone.  Symptoms have been  noticed by patient as well as her daughter.  Daughter has also noticed that patient's voice has become "soft".  Patient denies any swallowing problems or sialorrhea.   Patient has had lack of sense of smell for past 20 years.  She has mild constipation.  She has intermittent wild dreams.  She is just had problems with sleepwalking.  She has some insomnia issues.   Patient was diagnosed with possible essential tremor initially by PCP, but due to progression and change of symptoms consideration of Parkinson's disease was made.  Therefore patient was referred here for further evaluation.   No family history of tremor or Parkinson's disease.     REVIEW OF SYSTEMS: Out of a complete 14 system review of symptoms, the patient complains only of the following symptoms, chronic back pain, fatigue and all other reviewed systems are negative.    ALLERGIES: Allergies  Allergen Reactions   Lisinopril Other (See Comments)    cough   Tramadol Other (See Comments)    Sleepiness, confusion     HOME MEDICATIONS: Outpatient Medications Prior to Visit  Medication Sig Dispense Refill   amLODipine (NORVASC) 2.5 MG tablet Take 2.5 mg by mouth daily.     brimonidine (ALPHAGAN) 0.2 % ophthalmic solution Place 1 drop into both eyes 2 (two) times daily.      Calcium Citrate-Vitamin D 200-250 MG-UNIT TABS Take 1 tablet by mouth daily.     diphenhydramine-acetaminophen (TYLENOL PM) 25-500 MG TABS tablet Take 1 tablet by mouth at  bedtime as needed (pain).     fexofenadine (ALLEGRA) 180 MG tablet Take 180 mg by mouth daily.     fluticasone (FLONASE) 50 MCG/ACT nasal spray Place 1-2 sprays into the nose daily as needed for allergies or rhinitis.      folic acid (FOLVITE) 683 MCG tablet Take 400 mcg by mouth daily.     Krill Oil (OMEGA-3) 500 MG CAPS Take 1 capsule by mouth daily.     latanoprost (XALATAN) 0.005 % ophthalmic solution Place 1 drop into both eyes at bedtime.     losartan (COZAAR) 50 MG tablet Take  50 mg by mouth daily.     Multiple Vitamin (MULTIVITAMIN WITH MINERALS) TABS tablet Take 1 tablet by mouth daily.     omeprazole (PRILOSEC) 20 MG capsule Take 20 mg by mouth daily as needed (acid reflux).      polycarbophil (FIBERCON) 625 MG tablet Take 625 mg by mouth daily.     saccharomyces boulardii (FLORASTOR) 250 MG capsule Take 250 mg by mouth daily.     simvastatin (ZOCOR) 20 MG tablet Take 10 mg by mouth every evening.      vitamin B-12 (CYANOCOBALAMIN) 1000 MCG tablet Take 1,000 mcg by mouth as directed. 3 times weekly.  Monday, Wednesday, Friday     vitamin C (ASCORBIC ACID) 500 MG tablet Take 500 mg by mouth daily.     vitamin E 400 UNIT capsule Take 400 Units by mouth daily.     Glucosamine-Chondroit-Vit C-Mn (GLUCOSAMINE CHONDR 1500 COMPLX) CAPS Take 1 capsule by mouth daily.     carbidopa-levodopa (SINEMET IR) 25-100 MG tablet Take 1 tablet by mouth 3 (three) times daily before meals. 270 tablet 4   No facility-administered medications prior to visit.     PAST MEDICAL HISTORY: Past Medical History:  Diagnosis Date   CKD (chronic kidney disease)    "moderate"   Hyperlipidemia    Hypertension    Spinal stenosis      PAST SURGICAL HISTORY: Past Surgical History:  Procedure Laterality Date   BREAST BIOPSY Left 1977   benign   CATARACT EXTRACTION, BILATERAL Bilateral 2014   CHOLECYSTECTOMY, LAPAROSCOPIC  2001   TONSILLECTOMY     TOTAL VAGINAL HYSTERECTOMY  1981   secondary to uterine prolapse     FAMILY HISTORY: Family History  Problem Relation Age of Onset   Breast cancer Sister    Stroke Sister    Kidney failure Mother    Hypertension Mother    Kidney failure Father    Hypertension Father    Colon cancer Daughter 62       resection of colon, had chemo and is now cancer free. at age 28 -2018   Anuerysm Daughter 63   Diabetes Daughter 18       treaated with oral med     SOCIAL HISTORY: Social History   Socioeconomic History   Marital status:  Widowed    Spouse name: Not on file   Number of children: 1   Years of education: 29   Highest education level: Not on file  Occupational History   Not on file  Tobacco Use   Smoking status: Never   Smokeless tobacco: Never  Substance and Sexual Activity   Alcohol use: Yes    Alcohol/week: 2.0 standard drinks    Types: 2 Standard drinks or equivalent per week    Comment: wine on Friday nights   Drug use: No   Sexual activity: Never    Partners: Male  Birth control/protection: Surgical    Comment: hysterectomy  Other Topics Concern   Not on file  Social History Narrative   04/28/20 Lives alone, daughter lives at [place].  Good neighbor support.  Husband passed at age 109 from heart disease and also was diabetic.   Caffeine- coffee, 1 cup daily, maybe occas soda   Social Determinants of Health   Financial Resource Strain: Not on file  Food Insecurity: Not on file  Transportation Needs: Not on file  Physical Activity: Not on file  Stress: Not on file  Social Connections: Not on file  Intimate Partner Violence: Not on file      PHYSICAL EXAM  Vitals:   04/29/21 0946  BP: 120/70  Pulse: 74  Weight: 118 lb (53.5 kg)  Height: 5\' 2"  (1.575 m)   Body mass index is 21.58 kg/m.   Generalized: Well developed, in no acute distress  Cardiology: normal rate and rhythm, no murmur auscultated  Respiratory: clear to auscultation bilaterally    Neurological examination  Mentation: Alert oriented to time, place, history taking. Follows all commands speech and language fluent Cranial nerve II-XII: Pupils were equal round reactive to light. Extraocular movements were full, visual field were full on confrontational test. Facial sensation and strength were normal. Head turning and shoulder shrug  were normal and symmetric. Motor: The motor testing reveals 5 over 5 strength of all 4 extremities. Good symmetric motor tone is noted throughout. No tremor noted  Sensory: Sensory  testing is intact to soft touch on all 4 extremities. No evidence of extinction is noted.  Coordination: Cerebellar testing reveals good finger-nose-finger and heel-to-shin bilaterally.  Gait and station: Gait is normal.  Reflexes: Deep tendon reflexes are symmetric and normal bilaterally.     DIAGNOSTIC DATA (LABS, IMAGING, TESTING) - I reviewed patient records, labs, notes, testing and imaging myself where available.  No results found for: WBC, HGB, HCT, MCV, PLT No results found for: NA, K, CL, CO2, GLUCOSE, BUN, CREATININE, CALCIUM, PROT, ALBUMIN, AST, ALT, ALKPHOS, BILITOT, GFRNONAA, GFRAA No results found for: CHOL, HDL, LDLCALC, LDLDIRECT, TRIG, CHOLHDL No results found for: HGBA1C No results found for: VITAMINB12 No results found for: TSH  No flowsheet data found.   No flowsheet data found.   ASSESSMENT AND PLAN  87 y.o. year old female  has a past medical history of CKD (chronic kidney disease), Hyperlipidemia, Hypertension, and Spinal stenosis. here with     Parkinson's disease [facility])  Spinal stenosis of lumbar region with neurogenic claudication   Carrah is doing well, today. She will continue carbidopa/levodopa 25-100mg  three times daily. I have encouraged her to reach out to Dr Nelva Bush regarding back pain. She is not as active, recently, due to pain. We have discussed benefits of regular exercise in parkinson's disease. She has a TENS unit at home and encouraged to see if that helps with low back pain. She will follow up with me in 1 year, sooner if needed. She verbalizes understanding and agreement with this plan.   No orders of the defined types were placed in this encounter.    Meds ordered this encounter  Medications   carbidopa-levodopa (SINEMET IR) 25-100 MG tablet    Sig: Take 1 tablet by mouth 3 (three) times daily before meals.    Dispense:  270 tablet    Refill:  4    Order Specific Question:   Supervising Provider    Answer:   Melvenia Beam  [id number]  Debbora Presto, MSN, FNP-C 04/29/2021, 3:57 PM  [facility] [address] [place], [place] [address] [phone]

## 2021-04-29 ENCOUNTER — Ambulatory Visit (INDEPENDENT_AMBULATORY_CARE_PROVIDER_SITE_OTHER): Payer: Medicare Other | Admitting: Family Medicine

## 2021-04-29 ENCOUNTER — Encounter: Payer: Self-pay | Admitting: Family Medicine

## 2021-04-29 VITALS — BP 120/70 | HR 74 | Ht 62.0 in | Wt 118.0 lb

## 2021-04-29 DIAGNOSIS — M48062 Spinal stenosis, lumbar region with neurogenic claudication: Secondary | ICD-10-CM | POA: Diagnosis not present

## 2021-04-29 DIAGNOSIS — G20 Parkinson's disease: Secondary | ICD-10-CM | POA: Diagnosis not present

## 2021-04-29 MED ORDER — CARBIDOPA-LEVODOPA 25-100 MG PO TABS: 1.0000 | ORAL_TABLET | Freq: Three times a day (TID) | ORAL | 4 refills | Status: DC

## 2021-05-01 DIAGNOSIS — Z23 Encounter for immunization: Secondary | ICD-10-CM | POA: Diagnosis not present

## 2021-05-04 DIAGNOSIS — N183 Chronic kidney disease, stage 3 unspecified: Secondary | ICD-10-CM | POA: Diagnosis not present

## 2021-05-04 DIAGNOSIS — G8929 Other chronic pain: Secondary | ICD-10-CM | POA: Diagnosis not present

## 2021-05-04 DIAGNOSIS — N1831 Chronic kidney disease, stage 3a: Secondary | ICD-10-CM | POA: Diagnosis not present

## 2021-05-04 DIAGNOSIS — H409 Unspecified glaucoma: Secondary | ICD-10-CM | POA: Diagnosis not present

## 2021-05-04 DIAGNOSIS — M150 Primary generalized (osteo)arthritis: Secondary | ICD-10-CM | POA: Diagnosis not present

## 2021-05-04 DIAGNOSIS — G20 Parkinson's disease: Secondary | ICD-10-CM | POA: Diagnosis not present

## 2021-05-04 DIAGNOSIS — K219 Gastro-esophageal reflux disease without esophagitis: Secondary | ICD-10-CM | POA: Diagnosis not present

## 2021-05-04 DIAGNOSIS — E782 Mixed hyperlipidemia: Secondary | ICD-10-CM | POA: Diagnosis not present

## 2021-05-04 DIAGNOSIS — I10 Essential (primary) hypertension: Secondary | ICD-10-CM | POA: Diagnosis not present

## 2021-05-30 ENCOUNTER — Other Ambulatory Visit: Payer: Self-pay | Admitting: Diagnostic Neuroimaging

## 2021-06-16 DIAGNOSIS — K219 Gastro-esophageal reflux disease without esophagitis: Secondary | ICD-10-CM | POA: Diagnosis not present

## 2021-06-16 DIAGNOSIS — I10 Essential (primary) hypertension: Secondary | ICD-10-CM | POA: Diagnosis not present

## 2021-06-16 DIAGNOSIS — G20 Parkinson's disease: Secondary | ICD-10-CM | POA: Diagnosis not present

## 2021-06-16 DIAGNOSIS — H401131 Primary open-angle glaucoma, bilateral, mild stage: Secondary | ICD-10-CM | POA: Diagnosis not present

## 2021-06-16 DIAGNOSIS — H409 Unspecified glaucoma: Secondary | ICD-10-CM | POA: Diagnosis not present

## 2021-06-16 DIAGNOSIS — N183 Chronic kidney disease, stage 3 unspecified: Secondary | ICD-10-CM | POA: Diagnosis not present

## 2021-06-16 DIAGNOSIS — M150 Primary generalized (osteo)arthritis: Secondary | ICD-10-CM | POA: Diagnosis not present

## 2021-06-16 DIAGNOSIS — E782 Mixed hyperlipidemia: Secondary | ICD-10-CM | POA: Diagnosis not present

## 2021-06-16 DIAGNOSIS — G8929 Other chronic pain: Secondary | ICD-10-CM | POA: Diagnosis not present

## 2021-07-16 DIAGNOSIS — Z1231 Encounter for screening mammogram for malignant neoplasm of breast: Secondary | ICD-10-CM | POA: Diagnosis not present

## 2021-08-11 DIAGNOSIS — Z79899 Other long term (current) drug therapy: Secondary | ICD-10-CM | POA: Diagnosis not present

## 2021-08-11 DIAGNOSIS — R7301 Impaired fasting glucose: Secondary | ICD-10-CM | POA: Diagnosis not present

## 2021-08-11 DIAGNOSIS — E875 Hyperkalemia: Secondary | ICD-10-CM | POA: Diagnosis not present

## 2021-08-11 DIAGNOSIS — I10 Essential (primary) hypertension: Secondary | ICD-10-CM | POA: Diagnosis not present

## 2021-08-11 DIAGNOSIS — R7989 Other specified abnormal findings of blood chemistry: Secondary | ICD-10-CM | POA: Diagnosis not present

## 2021-08-11 DIAGNOSIS — E673 Hypervitaminosis D: Secondary | ICD-10-CM | POA: Diagnosis not present

## 2021-08-11 DIAGNOSIS — E782 Mixed hyperlipidemia: Secondary | ICD-10-CM | POA: Diagnosis not present

## 2021-08-14 DIAGNOSIS — E782 Mixed hyperlipidemia: Secondary | ICD-10-CM | POA: Diagnosis not present

## 2021-08-14 DIAGNOSIS — J309 Allergic rhinitis, unspecified: Secondary | ICD-10-CM | POA: Diagnosis not present

## 2021-08-14 DIAGNOSIS — G250 Essential tremor: Secondary | ICD-10-CM | POA: Diagnosis not present

## 2021-08-14 DIAGNOSIS — G20 Parkinson's disease: Secondary | ICD-10-CM | POA: Diagnosis not present

## 2021-08-14 DIAGNOSIS — H409 Unspecified glaucoma: Secondary | ICD-10-CM | POA: Diagnosis not present

## 2021-08-14 DIAGNOSIS — R7989 Other specified abnormal findings of blood chemistry: Secondary | ICD-10-CM | POA: Diagnosis not present

## 2021-08-14 DIAGNOSIS — Z23 Encounter for immunization: Secondary | ICD-10-CM | POA: Diagnosis not present

## 2021-08-14 DIAGNOSIS — Z0000 Encounter for general adult medical examination without abnormal findings: Secondary | ICD-10-CM | POA: Diagnosis not present

## 2021-08-14 DIAGNOSIS — I10 Essential (primary) hypertension: Secondary | ICD-10-CM | POA: Diagnosis not present

## 2021-08-14 DIAGNOSIS — E875 Hyperkalemia: Secondary | ICD-10-CM | POA: Diagnosis not present

## 2021-08-14 DIAGNOSIS — M150 Primary generalized (osteo)arthritis: Secondary | ICD-10-CM | POA: Diagnosis not present

## 2021-08-14 DIAGNOSIS — R7309 Other abnormal glucose: Secondary | ICD-10-CM | POA: Diagnosis not present

## 2021-08-14 DIAGNOSIS — Z1389 Encounter for screening for other disorder: Secondary | ICD-10-CM | POA: Diagnosis not present

## 2021-08-24 DIAGNOSIS — I10 Essential (primary) hypertension: Secondary | ICD-10-CM | POA: Diagnosis not present

## 2021-08-24 DIAGNOSIS — G8929 Other chronic pain: Secondary | ICD-10-CM | POA: Diagnosis not present

## 2021-08-24 DIAGNOSIS — H409 Unspecified glaucoma: Secondary | ICD-10-CM | POA: Diagnosis not present

## 2021-08-24 DIAGNOSIS — M150 Primary generalized (osteo)arthritis: Secondary | ICD-10-CM | POA: Diagnosis not present

## 2021-08-24 DIAGNOSIS — E782 Mixed hyperlipidemia: Secondary | ICD-10-CM | POA: Diagnosis not present

## 2021-08-24 DIAGNOSIS — N1831 Chronic kidney disease, stage 3a: Secondary | ICD-10-CM | POA: Diagnosis not present

## 2021-08-24 DIAGNOSIS — K219 Gastro-esophageal reflux disease without esophagitis: Secondary | ICD-10-CM | POA: Diagnosis not present

## 2021-08-24 DIAGNOSIS — G20 Parkinson's disease: Secondary | ICD-10-CM | POA: Diagnosis not present

## 2021-09-11 DIAGNOSIS — M85852 Other specified disorders of bone density and structure, left thigh: Secondary | ICD-10-CM | POA: Diagnosis not present

## 2021-09-15 DIAGNOSIS — H401131 Primary open-angle glaucoma, bilateral, mild stage: Secondary | ICD-10-CM | POA: Diagnosis not present

## 2021-09-18 DIAGNOSIS — E782 Mixed hyperlipidemia: Secondary | ICD-10-CM | POA: Diagnosis not present

## 2021-09-18 DIAGNOSIS — I10 Essential (primary) hypertension: Secondary | ICD-10-CM | POA: Diagnosis not present

## 2021-09-18 DIAGNOSIS — G20 Parkinson's disease: Secondary | ICD-10-CM | POA: Diagnosis not present

## 2021-09-18 DIAGNOSIS — N1832 Chronic kidney disease, stage 3b: Secondary | ICD-10-CM | POA: Diagnosis not present

## 2021-09-18 DIAGNOSIS — H409 Unspecified glaucoma: Secondary | ICD-10-CM | POA: Diagnosis not present

## 2021-09-18 DIAGNOSIS — G8929 Other chronic pain: Secondary | ICD-10-CM | POA: Diagnosis not present

## 2021-09-18 DIAGNOSIS — M150 Primary generalized (osteo)arthritis: Secondary | ICD-10-CM | POA: Diagnosis not present

## 2021-09-18 DIAGNOSIS — K219 Gastro-esophageal reflux disease without esophagitis: Secondary | ICD-10-CM | POA: Diagnosis not present

## 2021-09-25 DIAGNOSIS — Z23 Encounter for immunization: Secondary | ICD-10-CM | POA: Diagnosis not present

## 2021-10-28 DIAGNOSIS — E782 Mixed hyperlipidemia: Secondary | ICD-10-CM | POA: Diagnosis not present

## 2021-10-28 DIAGNOSIS — G20 Parkinson's disease: Secondary | ICD-10-CM | POA: Diagnosis not present

## 2021-10-28 DIAGNOSIS — N1832 Chronic kidney disease, stage 3b: Secondary | ICD-10-CM | POA: Diagnosis not present

## 2021-10-28 DIAGNOSIS — I10 Essential (primary) hypertension: Secondary | ICD-10-CM | POA: Diagnosis not present

## 2021-10-28 DIAGNOSIS — K219 Gastro-esophageal reflux disease without esophagitis: Secondary | ICD-10-CM | POA: Diagnosis not present

## 2021-10-28 DIAGNOSIS — M150 Primary generalized (osteo)arthritis: Secondary | ICD-10-CM | POA: Diagnosis not present

## 2021-10-28 DIAGNOSIS — G8929 Other chronic pain: Secondary | ICD-10-CM | POA: Diagnosis not present

## 2021-11-30 DIAGNOSIS — Z20822 Contact with and (suspected) exposure to covid-19: Secondary | ICD-10-CM | POA: Diagnosis not present

## 2021-12-13 DIAGNOSIS — Z20822 Contact with and (suspected) exposure to covid-19: Secondary | ICD-10-CM | POA: Diagnosis not present

## 2021-12-21 DIAGNOSIS — H401131 Primary open-angle glaucoma, bilateral, mild stage: Secondary | ICD-10-CM | POA: Diagnosis not present

## 2021-12-21 DIAGNOSIS — Z961 Presence of intraocular lens: Secondary | ICD-10-CM | POA: Diagnosis not present

## 2022-02-09 DIAGNOSIS — R7989 Other specified abnormal findings of blood chemistry: Secondary | ICD-10-CM | POA: Diagnosis not present

## 2022-02-15 DIAGNOSIS — G20 Parkinson's disease: Secondary | ICD-10-CM | POA: Diagnosis not present

## 2022-02-15 DIAGNOSIS — G8929 Other chronic pain: Secondary | ICD-10-CM | POA: Diagnosis not present

## 2022-02-15 DIAGNOSIS — E782 Mixed hyperlipidemia: Secondary | ICD-10-CM | POA: Diagnosis not present

## 2022-02-15 DIAGNOSIS — K219 Gastro-esophageal reflux disease without esophagitis: Secondary | ICD-10-CM | POA: Diagnosis not present

## 2022-02-15 DIAGNOSIS — M85852 Other specified disorders of bone density and structure, left thigh: Secondary | ICD-10-CM | POA: Diagnosis not present

## 2022-02-15 DIAGNOSIS — Z79899 Other long term (current) drug therapy: Secondary | ICD-10-CM | POA: Diagnosis not present

## 2022-02-15 DIAGNOSIS — J309 Allergic rhinitis, unspecified: Secondary | ICD-10-CM | POA: Diagnosis not present

## 2022-02-15 DIAGNOSIS — H409 Unspecified glaucoma: Secondary | ICD-10-CM | POA: Diagnosis not present

## 2022-02-15 DIAGNOSIS — I10 Essential (primary) hypertension: Secondary | ICD-10-CM | POA: Diagnosis not present

## 2022-03-23 ENCOUNTER — Telehealth: Payer: Self-pay | Admitting: Family Medicine

## 2022-03-23 NOTE — Telephone Encounter (Signed)
6/22 appt r/s with pt over the phone- NP out. ?

## 2022-03-24 DIAGNOSIS — H401131 Primary open-angle glaucoma, bilateral, mild stage: Secondary | ICD-10-CM | POA: Diagnosis not present

## 2022-04-15 ENCOUNTER — Telehealth: Payer: Self-pay | Admitting: Family Medicine

## 2022-04-15 MED ORDER — CARBIDOPA-LEVODOPA 25-100 MG PO TABS: 1.0000 | ORAL_TABLET | Freq: Three times a day (TID) | ORAL | 0 refills | Status: DC

## 2022-04-15 NOTE — Telephone Encounter (Signed)
Pt request refill for carbidopa-levodopa (SINEMET IR) 25-100 MG tablet at [facility].  Pt is out of medication asking if can send a week supply to [facility]. This would last until get full refill from [facility]. Would like a call from the nurse.

## 2022-04-15 NOTE — Telephone Encounter (Signed)
Pt last seen 04/29/21 and next yearly f/u 05/04/22.   I called pt to let her know I e-scribed carbidopa-levodopa 25-'100mg'$  to [facility] for 1 week supply. She states [facility] already has prescription and processing. Just waiting for delivery.

## 2022-04-20 ENCOUNTER — Other Ambulatory Visit: Payer: Self-pay | Admitting: *Deleted

## 2022-04-20 MED ORDER — CARBIDOPA-LEVODOPA 25-100 MG PO TABS: 1.0000 | ORAL_TABLET | Freq: Three times a day (TID) | ORAL | 0 refills | Status: DC

## 2022-04-20 NOTE — Telephone Encounter (Signed)
E-scribed 90 days supply to [facility]. Asked they expedite shipment as well.

## 2022-04-20 NOTE — Telephone Encounter (Signed)
Pt reporting that [facility]  is informing her that they have not been able to make contact with our office.  Pt's carbidopa-levodopa 25-'100mg'$  has not been filled by [facility]  pt is about to run out before she goes out of town.  Pt is asking if a 90 day supply of her carbidopa-levodopa 25-'100mg'$   can please be called into  [place], [place] Phone:  [phone]

## 2022-04-29 ENCOUNTER — Ambulatory Visit: Payer: Medicare Other | Admitting: Family Medicine

## 2022-05-04 ENCOUNTER — Encounter: Payer: Self-pay | Admitting: Family Medicine

## 2022-05-04 ENCOUNTER — Ambulatory Visit (INDEPENDENT_AMBULATORY_CARE_PROVIDER_SITE_OTHER): Payer: Medicare Other | Admitting: Family Medicine

## 2022-05-04 VITALS — BP 161/76 | HR 85 | Ht 62.0 in | Wt 116.0 lb

## 2022-05-04 DIAGNOSIS — R413 Other amnesia: Secondary | ICD-10-CM

## 2022-05-04 DIAGNOSIS — G20 Parkinson's disease: Secondary | ICD-10-CM | POA: Diagnosis not present

## 2022-05-04 MED ORDER — CARBIDOPA-LEVODOPA 25-100 MG PO TABS: 1.0000 | ORAL_TABLET | Freq: Three times a day (TID) | ORAL | 1 refills | Status: DC

## 2022-05-04 MED ORDER — MEMANTINE HCL 5 MG PO TABS: 5.0000 mg | ORAL_TABLET | Freq: Two times a day (BID) | ORAL | 1 refills | Status: DC

## 2022-05-04 NOTE — Progress Notes (Signed)
Chief Complaint  Patient presents with   Follow-up    Pt alone rm 1, follow up parkinson. 1 fall recently bruising her hip. Otherwise ok.     HISTORY OF PRESENT ILLNESS:  05/04/22 ALL:  Tracy Bernard returns for follow up for PD. She was last seen 04/2021 and doing fairly well on carb/levo 25-100mg  TID. Since, she reports continuing to do well. She is tolerating medication. She lives alone. She manages finances but admits balancing a checkbook is more difficult for her. She drives some, locally, during the day but does not drive at night. She is able to complete ADLs independently. Right hand tremor is very mild. She reports appetite is good. No trouble swallowing. Gait is fairly stable. No assistive device. She had an unwitnessed fall a few weeks ago while at the beach with friends. She is not sure what caused her to fall. She denies freezing. She reports hitting left hip. She denies any significant injury. She is able to wear wedges. She continues to have chronic back pain. She was seen by NS but not a surgical candidate due to age. She is no longer seeing Dr Ethelene Hal.   She does feel she is having more difficulty with her memory. She feels that she has to think longer about certain information. She uses a calender at home. She tries to stay active. She goes out to eat with friends, regularly. Her daughter lives at [facility].   04/29/2021 ALL: Tracy Bernard is a 88 y.o. female here today for follow up for PD. She continues carbidopa/levodopa 25-100mg  TID. She is tolerating medication well. Tremor has resolved. She does report that her daughter asks her to raise her voice when on the phone. She denies changes in gait. May feel a little off balance at times. No falls. No assistive devices. She is eating normally. No trouble swallowing. She continues to have significant back pain due to spinal stenosis. She is followed by Dr Ethelene Hal. NS did not feel she was a surgical candidate. She has not been as active. She  was started on gabapentin 100-200mg  at bedtime by PCP. She feels it has helped. She participated in PT in the past but was not sure it was helpful.    HISTORY (copied from Dr Richrd Humbles previous note)  UPDATE (04/28/20, VRP): Since last visit, doing well. Symptoms are improved. Severity is mild. No alleviating or aggravating factors. Tolerating carb/levo.     UPDATE (08/27/19, VRP): Since last visit, doing well, except for 1 fall in March 2020 (left knee pain). Getting better. Symptoms are stable. No alleviating or aggravating factors. Tolerating carb/levo.     UPDATE (12/25/18, VRP): Since last visit, doing well. Symptoms are improved on carb/levo (tremor). BP continues to fluctuate. No alleviating or aggravating factors. Tolerating meds.     PRIOR HPI (08/21/28): 88 year old female here for evaluation of balance and tremor problems.   Patient has had progressive resting and postural tremor in the right upper and right lower extremities since 2016.  She has also had balance and gait difficulty in last 6 months, and has had several falls.  Patient lives alone.  Symptoms have been noticed by patient as well as her daughter.  Daughter has also noticed that patient's voice has become "soft".  Patient denies any swallowing problems or sialorrhea.   Patient has had lack of sense of smell for past 20 years.  She has mild constipation.  She has intermittent wild dreams.  She is just had problems with  sleepwalking.  She has some insomnia issues.   Patient was diagnosed with possible essential tremor initially by PCP, but due to progression and change of symptoms consideration of Parkinson's disease was made.  Therefore patient was referred here for further evaluation.   No family history of tremor or Parkinson's disease.   REVIEW OF SYSTEMS: Out of a complete 14 system review of symptoms, the patient complains only of the following symptoms, chronic back pain, fatigue and all other reviewed systems are  negative.   ALLERGIES: Allergies  Allergen Reactions   Lisinopril Other (See Comments)    cough   Tramadol Other (See Comments)    Sleepiness, confusion     HOME MEDICATIONS: Outpatient Medications Prior to Visit  Medication Sig Dispense Refill   amLODipine (NORVASC) 2.5 MG tablet Take 2.5 mg by mouth daily.     brimonidine (ALPHAGAN) 0.2 % ophthalmic solution Place 1 drop into both eyes 2 (two) times daily.      Calcium Citrate-Vitamin D 200-250 MG-UNIT TABS Take 1 tablet by mouth daily.     diphenhydramine-acetaminophen (TYLENOL PM) 25-500 MG TABS tablet Take 1 tablet by mouth at bedtime as needed (pain).     fexofenadine (ALLEGRA) 180 MG tablet Take 180 mg by mouth daily.     fluticasone (FLONASE) 50 MCG/ACT nasal spray Place 1-2 sprays into the nose daily as needed for allergies or rhinitis.      folic acid (FOLVITE) 400 MCG tablet Take 400 mcg by mouth daily.     Krill Oil (OMEGA-3) 500 MG CAPS Take 1 capsule by mouth daily.     latanoprost (XALATAN) 0.005 % ophthalmic solution Place 1 drop into both eyes at bedtime.     losartan (COZAAR) 50 MG tablet Take 50 mg by mouth daily.     Multiple Vitamin (MULTIVITAMIN WITH MINERALS) TABS tablet Take 1 tablet by mouth daily.     omeprazole (PRILOSEC) 20 MG capsule Take 20 mg by mouth daily as needed (acid reflux).      polycarbophil (FIBERCON) 625 MG tablet Take 625 mg by mouth daily.     saccharomyces boulardii (FLORASTOR) 250 MG capsule Take 250 mg by mouth daily.     simvastatin (ZOCOR) 20 MG tablet Take 10 mg by mouth every evening.      vitamin B-12 (CYANOCOBALAMIN) 1000 MCG tablet Take 1,000 mcg by mouth as directed. 3 times weekly.  Monday, Wednesday, Friday     vitamin C (ASCORBIC ACID) 500 MG tablet Take 500 mg by mouth daily.     vitamin E 400 UNIT capsule Take 400 Units by mouth daily.     carbidopa-levodopa (SINEMET IR) 25-100 MG tablet Take 1 tablet by mouth 3 (three) times daily. 21 tablet 0   carbidopa-levodopa  (SINEMET IR) 25-100 MG tablet Take 1 tablet by mouth 3 (three) times daily before meals. 270 tablet 0   No facility-administered medications prior to visit.     PAST MEDICAL HISTORY: Past Medical History:  Diagnosis Date   CKD (chronic kidney disease)    "moderate"   Hyperlipidemia    Hypertension    Spinal stenosis      PAST SURGICAL HISTORY: Past Surgical History:  Procedure Laterality Date   BREAST BIOPSY Left 1977   benign   CATARACT EXTRACTION, BILATERAL Bilateral 2014   CHOLECYSTECTOMY, LAPAROSCOPIC  2001   TONSILLECTOMY     TOTAL VAGINAL HYSTERECTOMY  1981   secondary to uterine prolapse     FAMILY HISTORY: Family History  Problem Relation  Age of Onset   Breast cancer Sister    Stroke Sister    Kidney failure Mother    Hypertension Mother    Kidney failure Father    Hypertension Father    Colon cancer Daughter 17       resection of colon, had chemo and is now cancer free. at age 8 -2018   Anuerysm Daughter 75   Diabetes Daughter 65       treaated with oral med     SOCIAL HISTORY: Social History   Socioeconomic History   Marital status: Widowed    Spouse name: Not on file   Number of children: 1   Years of education: 39   Highest education level: Not on file  Occupational History   Not on file  Tobacco Use   Smoking status: Never   Smokeless tobacco: Never  Substance and Sexual Activity   Alcohol use: Yes    Alcohol/week: 2.0 standard drinks of alcohol    Types: 2 Standard drinks or equivalent per week    Comment: wine on Friday nights   Drug use: No   Sexual activity: Never    Partners: Male    Birth control/protection: Surgical    Comment: hysterectomy  Other Topics Concern   Not on file  Social History Narrative   04/28/20 Lives alone, daughter lives at [place].  Good neighbor support.  Husband passed at age 49 from heart disease and also was diabetic.   Caffeine- coffee, 1 cup daily, maybe occas soda   Social Determinants of  Health   Financial Resource Strain: Not on file  Food Insecurity: Not on file  Transportation Needs: Not on file  Physical Activity: Not on file  Stress: Not on file  Social Connections: Not on file  Intimate Partner Violence: Not on file      PHYSICAL EXAM  Vitals:   05/04/22 1105  BP: (!) 161/76  Pulse: 85  Weight: 116 lb (52.6 kg)  Height: 5\' 2"  (1.575 m)   Body mass index is 21.22 kg/m.   Generalized: Well developed, in no acute distress  Cardiology: normal rate and rhythm, no murmur auscultated  Respiratory: clear to auscultation bilaterally    Neurological examination  Mentation: Alert oriented to time, place, history taking. Follows all commands speech and language fluent Cranial nerve II-XII: Pupils were equal round reactive to light. Extraocular movements were full, visual field were full on confrontational test. Facial sensation and strength were normal. Head turning and shoulder shrug  were normal and symmetric. Motor: The motor testing reveals 5 over 5 strength of all 4 extremities. Good symmetric motor tone is noted throughout. Very mild right cogwheel rigidity noted. Dysrhythmia noted with toe taps. No tremor noted  Sensory: Sensory testing is intact to soft touch on all 4 extremities. No evidence of extinction is noted.  Coordination: Cerebellar testing reveals good finger-nose-finger and heel-to-shin bilaterally.  Gait and station: Gait is normal.  Reflexes: Deep tendon reflexes are symmetric and normal bilaterally.    DIAGNOSTIC DATA (LABS, IMAGING, TESTING) - I reviewed patient records, labs, notes, testing and imaging myself where available.  No results found for: "WBC", "HGB", "HCT", "MCV", "PLT" No results found for: "NA", "K", "CL", "CO2", "GLUCOSE", "BUN", "CREATININE", "CALCIUM", "PROT", "ALBUMIN", "AST", "ALT", "ALKPHOS", "BILITOT", "GFRNONAA", "GFRAA" No results found for: "CHOL", "HDL", "LDLCALC", "LDLDIRECT", "TRIG", "CHOLHDL" No results  found for: "HGBA1C" No results found for: "VITAMINB12" No results found for: "TSH"     05/04/2022   11:41  AM  MMSE - Mini Mental State Exam  Orientation to time 4  Orientation to Place 5  Registration 3  Attention/ Calculation 5  Recall 2  Language- name 2 objects 2  Language- repeat 1  Language- follow 3 step command 3  Language- read & follow direction 1  Write a sentence 1  Copy design 1  Total score 28         No data to display           ASSESSMENT AND PLAN  88 y.o. year old female  has a past medical history of CKD (chronic kidney disease), Hyperlipidemia, Hypertension, and Spinal stenosis. here with     Parkinson's disease [facility])  Memory loss   Brisia is doing well, today. She will continue carbidopa/levodopa 25-100mg  three times daily. She does have more concerns of memory loss. MMSE 28/30. We have discussed relationship of memory loss in Parkinson's Disease and risks/benefits of both donepezil and memantine. She is most comfortable starting low dose memantine 5mg  BID. Possible side effects reviewed and additional education provided in AVS. Memory compensation strategies advised. We have discussed benefits of regular exercise in Parkinson's disease. She will follow up with me in 4 months, sooner if needed. She verbalizes understanding and agreement with this plan.    No orders of the defined types were placed in this encounter.     Meds ordered this encounter  Medications   memantine (NAMENDA) 5 MG tablet    Sig: Take 1 tablet (5 mg total) by mouth 2 (two) times daily.    Dispense:  180 tablet    Refill:  1    Order Specific Question:   Supervising Provider    Answer:   Anson Fret [[id number]]   carbidopa-levodopa (SINEMET IR) 25-100 MG tablet    Sig: Take 1 tablet by mouth 3 (three) times daily before meals.    Dispense:  270 tablet    Refill:  1    Recently received 90 day refill, please call patient before shipment    Order Specific Question:    Supervising Provider    Answer:   Anson Fret [[id number]]     I spent 30 minutes of face-to-face and non-face-to-face time with patient.  This included previsit chart review, lab review, study review, order entry, electronic health record documentation, patient education.   Shawnie Dapper, MSN, FNP-C 05/04/2022, 5:52 PM  [facility] [address] [place], [place] [address] [phone]

## 2022-06-28 DIAGNOSIS — H401131 Primary open-angle glaucoma, bilateral, mild stage: Secondary | ICD-10-CM | POA: Diagnosis not present

## 2022-07-22 DIAGNOSIS — Z1231 Encounter for screening mammogram for malignant neoplasm of breast: Secondary | ICD-10-CM | POA: Diagnosis not present

## 2022-08-10 DIAGNOSIS — D1801 Hemangioma of skin and subcutaneous tissue: Secondary | ICD-10-CM | POA: Diagnosis not present

## 2022-08-10 DIAGNOSIS — Z85828 Personal history of other malignant neoplasm of skin: Secondary | ICD-10-CM | POA: Diagnosis not present

## 2022-08-10 DIAGNOSIS — L820 Inflamed seborrheic keratosis: Secondary | ICD-10-CM | POA: Diagnosis not present

## 2022-08-10 DIAGNOSIS — L84 Corns and callosities: Secondary | ICD-10-CM | POA: Diagnosis not present

## 2022-08-10 DIAGNOSIS — L570 Actinic keratosis: Secondary | ICD-10-CM | POA: Diagnosis not present

## 2022-08-10 DIAGNOSIS — L821 Other seborrheic keratosis: Secondary | ICD-10-CM | POA: Diagnosis not present

## 2022-08-12 DIAGNOSIS — E673 Hypervitaminosis D: Secondary | ICD-10-CM | POA: Diagnosis not present

## 2022-08-12 DIAGNOSIS — R7989 Other specified abnormal findings of blood chemistry: Secondary | ICD-10-CM | POA: Diagnosis not present

## 2022-08-12 DIAGNOSIS — E782 Mixed hyperlipidemia: Secondary | ICD-10-CM | POA: Diagnosis not present

## 2022-08-16 DIAGNOSIS — M85852 Other specified disorders of bone density and structure, left thigh: Secondary | ICD-10-CM | POA: Diagnosis not present

## 2022-08-16 DIAGNOSIS — Z1331 Encounter for screening for depression: Secondary | ICD-10-CM | POA: Diagnosis not present

## 2022-08-16 DIAGNOSIS — G479 Sleep disorder, unspecified: Secondary | ICD-10-CM | POA: Diagnosis not present

## 2022-08-16 DIAGNOSIS — R296 Repeated falls: Secondary | ICD-10-CM | POA: Diagnosis not present

## 2022-08-16 DIAGNOSIS — E782 Mixed hyperlipidemia: Secondary | ICD-10-CM | POA: Diagnosis not present

## 2022-08-16 DIAGNOSIS — R413 Other amnesia: Secondary | ICD-10-CM | POA: Diagnosis not present

## 2022-08-16 DIAGNOSIS — Z0000 Encounter for general adult medical examination without abnormal findings: Secondary | ICD-10-CM | POA: Diagnosis not present

## 2022-08-16 DIAGNOSIS — Z23 Encounter for immunization: Secondary | ICD-10-CM | POA: Diagnosis not present

## 2022-08-16 DIAGNOSIS — I10 Essential (primary) hypertension: Secondary | ICD-10-CM | POA: Diagnosis not present

## 2022-08-16 DIAGNOSIS — G8929 Other chronic pain: Secondary | ICD-10-CM | POA: Diagnosis not present

## 2022-08-16 DIAGNOSIS — G20A1 Parkinson's disease without dyskinesia, without mention of fluctuations: Secondary | ICD-10-CM | POA: Diagnosis not present

## 2022-08-16 DIAGNOSIS — N1832 Chronic kidney disease, stage 3b: Secondary | ICD-10-CM | POA: Diagnosis not present

## 2022-09-01 NOTE — Progress Notes (Signed)
Chief Complaint  Patient presents with   Follow-up    Pt alone, rm 1. Here for parkinsons follow up. Overall stable. Having difficulty with falling asleep and was interested in getting something to help her with her sleep. She discussed with PCP and they wanted to defer to [place] to make sure wouldn't interact with parkinsons medications.(Her daughter takes trazodone and she tried one which worked but PCP didn't feel comfortable)  She has not tried otc sleep aids.  Memory she states isnt much better.    HISTORY OF PRESENT ILLNESS:  09/06/22 ALL:  Tracy Bernard returns for follow up for PD and memory loss. She was last seen 04/2022. We continues carb/levo IR 1 tab TID and added memantine '5mg'$  BID. Since, She reports doing fairly well. No significant changes. Tremor is well managed. Gait is stable. She feels appetite is good. She has lost about 8lbs over the past year. She denies trouble swallowing. She is having more trouble sleeping. She usually goes to bed around 8:30pm and wakes around 4:30-5am. She feels it takes her longer to get to sleep. She admits to sleeping with TV on. She has tried cutting TV off but feels insomnia is worse. She took trazodone '50mg'$  that helped. She has not tried melatonin. She feels memory is about the same. She has tolerated memantine.   05/04/2022 ALL: Tracy Bernard returns for follow up for PD. She was last seen 04/2021 and doing fairly well on carb/levo 25-'100mg'$  TID. Since, she reports continuing to do well. She is tolerating medication. She lives alone. She manages finances but admits balancing a checkbook is more difficult for her. She drives some, locally, during the day but does not drive at night. She is able to complete ADLs independently. Right hand tremor is very mild. She reports appetite is good. No trouble swallowing. Gait is fairly stable. No assistive device. She had an unwitnessed fall a few weeks ago while at the beach with friends. She is not sure what caused her to fall. She  denies freezing. She reports hitting left hip. She denies any significant injury. She is able to wear wedges. She continues to have chronic back pain. She was seen by NS but not a surgical candidate due to age. She is no longer seeing Dr Nelva Bush.   She does feel she is having more difficulty with her memory. She feels that she has to think longer about certain information. She uses a calender at home. She tries to stay active. She goes out to eat with friends, regularly. Her daughter lives at [facility].   04/29/2021 ALL: Tracy Bernard is a 88 y.o. female here today for follow up for PD. She continues carbidopa/levodopa 25-'100mg'$  TID. She is tolerating medication well. Tremor has resolved. She does report that her daughter asks her to raise her voice when on the phone. She denies changes in gait. May feel a little off balance at times. No falls. No assistive devices. She is eating normally. No trouble swallowing. She continues to have significant back pain due to spinal stenosis. She is followed by Dr Nelva Bush. NS did not feel she was a surgical candidate. She has not been as active. She was started on gabapentin 100-'200mg'$  at bedtime by PCP. She feels it has helped. She participated in PT in the past but was not sure it was helpful.    HISTORY (copied from Dr Gladstone Lighter previous note)  UPDATE (04/28/20, VRP): Since last visit, doing well. Symptoms are improved. Severity is mild.  No alleviating or aggravating factors. Tolerating carb/levo.     UPDATE (08/27/19, VRP): Since last visit, doing well, except for 1 fall in March 2020 (left knee pain). Getting better. Symptoms are stable. No alleviating or aggravating factors. Tolerating carb/levo.     UPDATE (12/25/18, VRP): Since last visit, doing well. Symptoms are improved on carb/levo (tremor). BP continues to fluctuate. No alleviating or aggravating factors. Tolerating meds.     PRIOR HPI (08/21/28): 88 year old female here for evaluation of balance and  tremor problems.   Patient has had progressive resting and postural tremor in the right upper and right lower extremities since 2016.  She has also had balance and gait difficulty in last 6 months, and has had several falls.  Patient lives alone.  Symptoms have been noticed by patient as well as her daughter.  Daughter has also noticed that patient's voice has become "soft".  Patient denies any swallowing problems or sialorrhea.   Patient has had lack of sense of smell for past 20 years.  She has mild constipation.  She has intermittent wild dreams.  She is just had problems with sleepwalking.  She has some insomnia issues.   Patient was diagnosed with possible essential tremor initially by PCP, but due to progression and change of symptoms consideration of Parkinson's disease was made.  Therefore patient was referred here for further evaluation.   No family history of tremor or Parkinson's disease.   REVIEW OF SYSTEMS: Out of a complete 14 system review of symptoms, the patient complains only of the following symptoms, chronic back pain, fatigue, memory loss and all other reviewed systems are negative.   ALLERGIES: Allergies  Allergen Reactions   Lisinopril Other (See Comments)    cough   Tramadol Other (See Comments)    Sleepiness, confusion     HOME MEDICATIONS: Outpatient Medications Prior to Visit  Medication Sig Dispense Refill   amLODipine (NORVASC) 2.5 MG tablet Take 2.5 mg by mouth daily.     brimonidine (ALPHAGAN) 0.2 % ophthalmic solution Place 1 drop into both eyes 2 (two) times daily.      Calcium Citrate-Vitamin D 200-250 MG-UNIT TABS Take 1 tablet by mouth daily.     carbidopa-levodopa (SINEMET IR) 25-100 MG tablet Take 1 tablet by mouth 3 (three) times daily before meals. 270 tablet 1   diphenhydramine-acetaminophen (TYLENOL PM) 25-500 MG TABS tablet Take 1 tablet by mouth at bedtime as needed (pain).     fexofenadine (ALLEGRA) 180 MG tablet Take 180 mg by mouth daily.      fluticasone (FLONASE) 50 MCG/ACT nasal spray Place 1-2 sprays into the nose daily as needed for allergies or rhinitis.      Krill Oil (OMEGA-3) 500 MG CAPS Take 1 capsule by mouth daily.     latanoprost (XALATAN) 0.005 % ophthalmic solution Place 1 drop into both eyes at bedtime.     losartan (COZAAR) 50 MG tablet Take 50 mg by mouth daily.     memantine (NAMENDA) 5 MG tablet Take 1 tablet (5 mg total) by mouth 2 (two) times daily. 180 tablet 1   Multiple Vitamin (MULTIVITAMIN WITH MINERALS) TABS tablet Take 1 tablet by mouth daily.     omeprazole (PRILOSEC) 20 MG capsule Take 20 mg by mouth daily as needed (acid reflux).      polycarbophil (FIBERCON) 625 MG tablet Take 625 mg by mouth daily.     saccharomyces boulardii (FLORASTOR) 250 MG capsule Take 250 mg by mouth daily.  simvastatin (ZOCOR) 20 MG tablet Take 10 mg by mouth every evening.      vitamin B-12 (CYANOCOBALAMIN) 1000 MCG tablet Take 1,000 mcg by mouth as directed. 3 times weekly.  Monday, Wednesday, Friday     vitamin C (ASCORBIC ACID) 500 MG tablet Take 500 mg by mouth daily.     vitamin E 400 UNIT capsule Take 400 Units by mouth daily.     folic acid (FOLVITE) 938 MCG tablet Take 400 mcg by mouth daily.     No facility-administered medications prior to visit.     PAST MEDICAL HISTORY: Past Medical History:  Diagnosis Date   CKD (chronic kidney disease)    "moderate"   Hyperlipidemia    Hypertension    Spinal stenosis      PAST SURGICAL HISTORY: Past Surgical History:  Procedure Laterality Date   BREAST BIOPSY Left 1977   benign   CATARACT EXTRACTION, BILATERAL Bilateral 2014   CHOLECYSTECTOMY, LAPAROSCOPIC  2001   TONSILLECTOMY     TOTAL VAGINAL HYSTERECTOMY  1981   secondary to uterine prolapse     FAMILY HISTORY: Family History  Problem Relation Age of Onset   Breast cancer Sister    Stroke Sister    Kidney failure Mother    Hypertension Mother    Kidney failure Father    Hypertension  Father    Colon cancer Daughter 8       resection of colon, had chemo and is now cancer free. at age 68 -2018   Anuerysm Daughter 62   Diabetes Daughter 68       treaated with oral med     SOCIAL HISTORY: Social History   Socioeconomic History   Marital status: Widowed    Spouse name: Not on file   Number of children: 1   Years of education: 75   Highest education level: Not on file  Occupational History   Not on file  Tobacco Use   Smoking status: Never   Smokeless tobacco: Never  Substance and Sexual Activity   Alcohol use: Yes    Alcohol/week: 2.0 standard drinks of alcohol    Types: 2 Standard drinks or equivalent per week    Comment: wine on Friday nights   Drug use: No   Sexual activity: Never    Partners: Male    Birth control/protection: Surgical    Comment: hysterectomy  Other Topics Concern   Not on file  Social History Narrative   04/28/20 Lives alone, daughter lives at [place].  Good neighbor support.  Husband passed at age 38 from heart disease and also was diabetic.   Caffeine- coffee, 1 cup daily, maybe occas soda   Social Determinants of Health   Financial Resource Strain: Not on file  Food Insecurity: Not on file  Transportation Needs: Not on file  Physical Activity: Not on file  Stress: Not on file  Social Connections: Not on file  Intimate Partner Violence: Not on file      PHYSICAL EXAM  Vitals:   09/06/22 1306  BP: 132/63  Pulse: 69  Weight: 112 lb (50.8 kg)  Height: '5\' 2"'$  (1.575 m)    Body mass index is 20.49 kg/m.   Generalized: Well developed, in no acute distress  Cardiology: normal rate and rhythm, no murmur auscultated  Respiratory: clear to auscultation bilaterally    Neurological examination  Mentation: Alert oriented to time, place, history taking. Follows all commands speech and language fluent Cranial nerve II-XII: Pupils were equal  round reactive to light. Extraocular movements were full, visual field were  full on confrontational test. Facial sensation and strength were normal. Head turning and shoulder shrug  were normal and symmetric. Motor: The motor testing reveals 5 over 5 strength of all 4 extremities. Good symmetric motor tone is noted throughout. Very mild right cogwheel rigidity noted. Dysrhythmia noted with toe taps. No tremor noted  Sensory: Sensory testing is intact to soft touch on all 4 extremities. No evidence of extinction is noted.  Coordination: Cerebellar testing reveals good finger-nose-finger and heel-to-shin bilaterally.  Gait and station: Gait is normal.  Reflexes: Deep tendon reflexes are symmetric and normal bilaterally.    DIAGNOSTIC DATA (LABS, IMAGING, TESTING) - I reviewed patient records, labs, notes, testing and imaging myself where available.  No results found for: "WBC", "HGB", "HCT", "MCV", "PLT" No results found for: "NA", "K", "CL", "CO2", "GLUCOSE", "BUN", "CREATININE", "CALCIUM", "PROT", "ALBUMIN", "AST", "ALT", "ALKPHOS", "BILITOT", "GFRNONAA", "GFRAA" No results found for: "CHOL", "HDL", "LDLCALC", "LDLDIRECT", "TRIG", "CHOLHDL" No results found for: "HGBA1C" No results found for: "VITAMINB12" No results found for: "TSH"     09/06/2022   12:58 PM 05/04/2022   11:41 AM  MMSE - Mini Mental State Exam  Orientation to time 4 4  Orientation to Place 5 5  Registration 3 3  Attention/ Calculation 5 5  Recall 3 2  Language- name 2 objects 2 2  Language- repeat 1 1  Language- follow 3 step command 3 3  Language- read & follow direction 1 1  Write a sentence 1 1  Copy design 1 1  Total score 29 28         No data to display           ASSESSMENT AND PLAN  88 y.o. year old female  has a past medical history of CKD (chronic kidney disease), Hyperlipidemia, Hypertension, and Spinal stenosis. here with     Parkinson's disease without dyskinesia or fluctuating manifestations  Memory loss  Insomnia, unspecified type   Loda is doing well,  today. She will continue carbidopa/levodopa 25-'100mg'$  three times daily. She reports memory loss is unchanged. MMSE 29/30, previously 28/30. We will continue memantine. She prefers to continue '5mg'$  BID. She may call me to increase dose if she wishes. Memory compensation strategies advised. She is having more difficulty getting to sleep. I will have her try melatonin 3-'5mg'$  QHS. Sleep hygiene reviewed. Educational material provided in AVS. We have discussed benefits of regular exercise in Parkinson's disease. She will follow up with me in 6 months, sooner if needed. She verbalizes understanding and agreement with this plan.    No orders of the defined types were placed in this encounter.     No orders of the defined types were placed in this encounter.    I spent 30 minutes of face-to-face and non-face-to-face time with patient.  This included previsit chart review, lab review, study review, order entry, electronic health record documentation, patient education.   Debbora Presto, MSN, FNP-C 09/06/2022, 3:47 PM  [facility] [address] [place], [place] [address] [phone]

## 2022-09-01 NOTE — Patient Instructions (Signed)
Below is our plan:  We will continue carbidopa levodopa 1 tablet three times daily. We will continue memantine '5mg'$  twice daily for now, May consider increasing dose to '5mg'$  in am and '10mg'$  at bedtime if you wish. You can call me for an updated prescription. Let's try melatonin 3-'5mg'$  every night at bedtime. Take at least 30 minutes before you are ready to go to sleep. We can consider trazodone int he future if this doesn't work.   Please make sure you are staying well hydrated. I recommend 50-60 ounces daily. Well balanced diet and regular exercise encouraged. Consistent sleep schedule with 6-8 hours recommended.   Please continue follow up with care team as directed.   Follow up with me in 6 months   You may receive a survey regarding today's visit. I encourage you to leave honest feed back as I do use this information to improve patient care. Thank you for seeing me today!   Management of Memory Problems   There are some general things you can do to help manage your memory problems.  Your memory may not in fact recover, but by using techniques and strategies you will be able to manage your memory difficulties better.   1)  Establish a routine. Try to establish and then stick to a regular routine.  By doing this, you will get used to what to expect and you will reduce the need to rely on your memory.  Also, try to do things at the same time of day, such as taking your medication or checking your calendar first thing in the morning. Think about think that you can do as a part of a regular routine and make a list.  Then enter them into a daily planner to remind you.  This will help you establish a routine.   2)  Organize your environment. Organize your environment so that it is uncluttered.  Decrease visual stimulation.  Place everyday items such as keys or cell phone in the same place every day (ie.  Basket next to front door) Use post it notes with a brief message to yourself (ie. Turn off light,  lock the door) Use labels to indicate where things go (ie. Which cupboards are for food, dishes, etc.) Keep a notepad and pen by the telephone to take messages   3)  Memory Aids A diary or journal/notebook/daily planner Making a list (shopping list, chore list, to do list that needs to be done) Using an alarm as a reminder (kitchen timer or cell phone alarm) Using cell phone to store information (Notes, Calendar, Reminders) Calendar/White board placed in a prominent position Post-it notes   In order for memory aids to be useful, you need to have good habits.  It's no good remembering to make a note in your journal if you don't remember to look in it.  Try setting aside a certain time of day to look in journal.   4)  Improving mood and managing fatigue. There may be other factors that contribute to memory difficulties.  Factors, such as anxiety, depression and tiredness can affect memory. Regular gentle exercise can help improve your mood and give you more energy. Exercise: there are short videos created by the [employer] on Health specially for older adults: https://bit.ly/2I30q97.  Mediterranean diet: which emphasizes fruits, vegetables, whole grains, legumes, fish, and other seafood; unsaturated fats such as olive oils; and low amounts of red meat, eggs, and sweets. A variation of this, called MIND [facility] Intervention for  Neurodegenerative Delay) incorporates the DASH (Dietary Approaches to Stop Hypertension) diet, which has been shown to lower high blood pressure, a risk factor for Alzheimer's disease. More information at: RepublicForum.gl.  Aerobic exercise that improve heart health is also good for the mind.  [employer] on Aging have short videos for exercises that you can do at home: GoldCloset.com.ee Simple relaxation techniques may help relieve symptoms of anxiety Try to get  back to completing activities or hobbies you enjoyed doing in the past. Learn to pace yourself through activities to decrease fatigue. Find out about some local support groups where you can share experiences with others. Try and achieve 7-8 hours of sleep at night.

## 2022-09-06 ENCOUNTER — Ambulatory Visit (INDEPENDENT_AMBULATORY_CARE_PROVIDER_SITE_OTHER): Payer: Medicare Other | Admitting: Family Medicine

## 2022-09-06 ENCOUNTER — Encounter: Payer: Self-pay | Admitting: Family Medicine

## 2022-09-06 VITALS — BP 132/63 | HR 69 | Ht 62.0 in | Wt 112.0 lb

## 2022-09-06 DIAGNOSIS — G4700 Insomnia, unspecified: Secondary | ICD-10-CM

## 2022-09-06 DIAGNOSIS — R413 Other amnesia: Secondary | ICD-10-CM

## 2022-09-06 DIAGNOSIS — G20A1 Parkinson's disease without dyskinesia, without mention of fluctuations: Secondary | ICD-10-CM | POA: Diagnosis not present

## 2022-10-12 DIAGNOSIS — H401131 Primary open-angle glaucoma, bilateral, mild stage: Secondary | ICD-10-CM | POA: Diagnosis not present

## 2022-12-08 ENCOUNTER — Telehealth: Payer: Self-pay | Admitting: Family Medicine

## 2022-12-08 NOTE — Telephone Encounter (Signed)
Patient is requesting a refill for carbidopa. She states she has new insurance and needs a pre authorization for medication.She provided a number to call [phone]

## 2022-12-08 NOTE — Telephone Encounter (Signed)
I called pt at [phone] to see what pharmacy she wanted prescription to go to. LVM for pt to call office

## 2022-12-09 MED ORDER — CARBIDOPA-LEVODOPA 25-100 MG PO TABS: 1.0000 | ORAL_TABLET | Freq: Three times a day (TID) | ORAL | 1 refills | Status: DC

## 2022-12-09 NOTE — Telephone Encounter (Addendum)
LVM at [phone] for pt to call office.  Called pt at [phone]. LVM for her to call office.

## 2022-12-09 NOTE — Telephone Encounter (Addendum)
Patient called back and would like Rx to be sent to [facility]. Rx sent.

## 2022-12-09 NOTE — Addendum Note (Signed)
Addended by: Thamas Jaegers on: 12/09/2022 11:20 AM   Modules accepted: Orders

## 2023-03-09 ENCOUNTER — Ambulatory Visit: Payer: Medicare Other | Admitting: Family Medicine

## 2023-03-09 ENCOUNTER — Encounter: Payer: Self-pay | Admitting: Family Medicine

## 2023-03-09 VITALS — BP 197/84 | HR 84 | Ht 62.0 in | Wt 110.5 lb

## 2023-03-09 DIAGNOSIS — G20A1 Parkinson's disease without dyskinesia, without mention of fluctuations: Secondary | ICD-10-CM

## 2023-03-09 DIAGNOSIS — R413 Other amnesia: Secondary | ICD-10-CM

## 2023-03-09 MED ORDER — MEMANTINE HCL ER 14 MG PO CP24: 14.0000 mg | ORAL_CAPSULE | Freq: Every day | ORAL | 1 refills | Status: DC

## 2023-03-09 MED ORDER — CARBIDOPA-LEVODOPA 25-100 MG PO TABS: 1.0000 | ORAL_TABLET | Freq: Three times a day (TID) | ORAL | 1 refills | Status: DC

## 2023-03-09 NOTE — Patient Instructions (Signed)
Below is our plan:  We will continue carbidopa levodopa 1 tablet three times daily 30 minutes before meals. I am going to switch your memantine from an immediate release tablet to an extended release tablet. You will take 14mg  daily. You can take morning or evening. Discontinue the immediate release (5mg  tablet) that you have been taking twice daily. Call me with any concerns.   Please make sure you are staying well hydrated. I recommend 50-60 ounces daily. Well balanced diet and regular exercise encouraged. Consistent sleep schedule with 6-8 hours recommended.   Please continue follow up with care team as directed.   Follow up with me in 6 months  You may receive a survey regarding today's visit. I encourage you to leave honest feed back as I do use this information to improve patient care. Thank you for seeing me today!   Management of Memory Problems   There are some general things you can do to help manage your memory problems.  Your memory may not in fact recover, but by using techniques and strategies you will be able to manage your memory difficulties better.   1)  Establish a routine. Try to establish and then stick to a regular routine.  By doing this, you will get used to what to expect and you will reduce the need to rely on your memory.  Also, try to do things at the same time of day, such as taking your medication or checking your calendar first thing in the morning. Think about think that you can do as a part of a regular routine and make a list.  Then enter them into a daily planner to remind you.  This will help you establish a routine.   2)  Organize your environment. Organize your environment so that it is uncluttered.  Decrease visual stimulation.  Place everyday items such as keys or cell phone in the same place every day (ie.  Basket next to front door) Use post it notes with a brief message to yourself (ie. Turn off light, lock the door) Use labels to indicate where  things go (ie. Which cupboards are for food, dishes, etc.) Keep a notepad and pen by the telephone to take messages   3)  Memory Aids A diary or journal/notebook/daily planner Making a list (shopping list, chore list, to do list that needs to be done) Using an alarm as a reminder (kitchen timer or cell phone alarm) Using cell phone to store information (Notes, Calendar, Reminders) Calendar/White board placed in a prominent position Post-it notes   In order for memory aids to be useful, you need to have good habits.  It's no good remembering to make a note in your journal if you don't remember to look in it.  Try setting aside a certain time of day to look in journal.   4)  Improving mood and managing fatigue. There may be other factors that contribute to memory difficulties.  Factors, such as anxiety, depression and tiredness can affect memory. Regular gentle exercise can help improve your mood and give you more energy. Exercise: there are short videos created by the [employer] on Health specially for older adults: https://bit.ly/2I30q97.  Mediterranean diet: which emphasizes fruits, vegetables, whole grains, legumes, fish, and other seafood; unsaturated fats such as olive oils; and low amounts of red meat, eggs, and sweets. A variation of this, called MIND [facility] Intervention for Neurodegenerative Delay) incorporates the DASH (Dietary Approaches to Stop Hypertension) diet, which has been shown  to lower high blood pressure, a risk factor for Alzheimer's disease. More information at: ExitMarketing.de.  Aerobic exercise that improve heart health is also good for the mind.  [employer] on Aging have short videos for exercises that you can do at home: BlindWorkshop.com.pt Simple relaxation techniques may help relieve symptoms of anxiety Try to get back to completing activities or hobbies you  enjoyed doing in the past. Learn to pace yourself through activities to decrease fatigue. Find out about some local support groups where you can share experiences with others. Try and achieve 7-8 hours of sleep at night.

## 2023-03-09 NOTE — Progress Notes (Signed)
Chief Complaint  Patient presents with   Room 10    Pt is here Alone. Pt states that things have been going good since last appointment. Pt states no new tremors to report today. Pt states that everything has been about the same.     HISTORY OF PRESENT ILLNESS:  03/09/23 ALL:  Tracy Bernard returns for follow up for PD with memory loss. She was last seen 08/2022 and doing well. We continued carb-levo IR 1 tab TID and memantine 5mg  BID. We discussed using melatonin to help with insomnia. Since, she reports doing well. Tremor is stable. She continues carb/levo and tolerating well. She started melatonin and feels it has really helped. She is sleeping well. No changes in gait. No recent falls. No assistive devices. Memory is stable. She continues to live alone. She has an alert necklace for safety.   BP at home are usually pretty good. She feels they are usually around 120-130s sys. She has not taken losartan yet today. She got busy with her daughter and hasn't taken medications today. She is able to dose medications independently. She does really well with most all medicaitons with exception of memantine. She often forgets to take second dose. She seems to be tolerating it well.  She is asymptomatic, today.   09/06/2022 ALL: Tracy Bernard returns for follow up for PD and memory loss. She was last seen 04/2022. We continues carb/levo IR 1 tab TID and added memantine 5mg  BID. Since, She reports doing fairly well. No significant changes. Tremor is well managed. Gait is stable. She feels appetite is good. She has lost about 8lbs over the past year. She denies trouble swallowing. She is having more trouble sleeping. She usually goes to bed around 8:30pm and wakes around 4:30-5am. She feels it takes her longer to get to sleep. She admits to sleeping with TV on. She has tried cutting TV off but feels insomnia is worse. She took trazodone 50mg  that helped. She has not tried melatonin. She feels memory is about the same. She  has tolerated memantine.   05/04/2022 ALL: Tracy Bernard returns for follow up for PD. She was last seen 04/2021 and doing fairly well on carb/levo 25-100mg  TID. Since, she reports continuing to do well. She is tolerating medication. She lives alone. She manages finances but admits balancing a checkbook is more difficult for her. She drives some, locally, during the day but does not drive at night. She is able to complete ADLs independently. Right hand tremor is very mild. She reports appetite is good. No trouble swallowing. Gait is fairly stable. No assistive device. She had an unwitnessed fall a few weeks ago while at the beach with friends. She is not sure what caused her to fall. She denies freezing. She reports hitting left hip. She denies any significant injury. She is able to wear wedges. She continues to have chronic back pain. She was seen by NS but not a surgical candidate due to age. She is no longer seeing Dr Ethelene Hal.   She does feel she is having more difficulty with her memory. She feels that she has to think longer about certain information. She uses a calender at home. She tries to stay active. She goes out to eat with friends, regularly. Her daughter lives at [facility].   04/29/2021 ALL: Tracy Bernard is a 89 y.o. female here today for follow up for PD. She continues carbidopa/levodopa 25-100mg  TID. She is tolerating medication well. Tremor has resolved. She does report  that her daughter asks her to raise her voice when on the phone. She denies changes in gait. May feel a little off balance at times. No falls. No assistive devices. She is eating normally. No trouble swallowing. She continues to have significant back pain due to spinal stenosis. She is followed by Dr Ethelene Hal. NS did not feel she was a surgical candidate. She has not been as active. She was started on gabapentin 100-200mg  at bedtime by PCP. She feels it has helped. She participated in PT in the past but was not sure it was helpful.     HISTORY (copied from Dr Richrd Humbles previous note)  UPDATE (04/28/20, VRP): Since last visit, doing well. Symptoms are improved. Severity is mild. No alleviating or aggravating factors. Tolerating carb/levo.     UPDATE (08/27/19, VRP): Since last visit, doing well, except for 1 fall in March 2020 (left knee pain). Getting better. Symptoms are stable. No alleviating or aggravating factors. Tolerating carb/levo.     UPDATE (12/25/18, VRP): Since last visit, doing well. Symptoms are improved on carb/levo (tremor). BP continues to fluctuate. No alleviating or aggravating factors. Tolerating meds.     PRIOR HPI (08/21/28): 89 year old female here for evaluation of balance and tremor problems.   Patient has had progressive resting and postural tremor in the right upper and right lower extremities since 2016.  She has also had balance and gait difficulty in last 6 months, and has had several falls.  Patient lives alone.  Symptoms have been noticed by patient as well as her daughter.  Daughter has also noticed that patient's voice has become "soft".  Patient denies any swallowing problems or sialorrhea.   Patient has had lack of sense of smell for past 20 years.  She has mild constipation.  She has intermittent wild dreams.  She is just had problems with sleepwalking.  She has some insomnia issues.   Patient was diagnosed with possible essential tremor initially by PCP, but due to progression and change of symptoms consideration of Parkinson's disease was made.  Therefore patient was referred here for further evaluation.   No family history of tremor or Parkinson's disease.   REVIEW OF SYSTEMS: Out of a complete 14 system review of symptoms, the patient complains only of the following symptoms, chronic back pain, fatigue, memory loss and all other reviewed systems are negative.   ALLERGIES: Allergies  Allergen Reactions   Lisinopril Other (See Comments)    cough   Tramadol Other (See Comments)     Sleepiness, confusion     HOME MEDICATIONS: Outpatient Medications Prior to Visit  Medication Sig Dispense Refill   brimonidine (ALPHAGAN) 0.2 % ophthalmic solution Place 1 drop into both eyes 2 (two) times daily.      Calcium Citrate-Vitamin D 200-250 MG-UNIT TABS Take 1 tablet by mouth daily.     diphenhydramine-acetaminophen (TYLENOL PM) 25-500 MG TABS tablet Take 1 tablet by mouth at bedtime as needed (pain).     fexofenadine (ALLEGRA) 180 MG tablet Take 180 mg by mouth daily.     fluticasone (FLONASE) 50 MCG/ACT nasal spray Place 1-2 sprays into the nose daily as needed for allergies or rhinitis.      Krill Oil (OMEGA-3) 500 MG CAPS Take 1 capsule by mouth daily.     latanoprost (XALATAN) 0.005 % ophthalmic solution Place 1 drop into both eyes at bedtime.     losartan (COZAAR) 50 MG tablet Take 50 mg by mouth daily.     Multiple Vitamin (MULTIVITAMIN  WITH MINERALS) TABS tablet Take 1 tablet by mouth daily.     omeprazole (PRILOSEC) 20 MG capsule Take 20 mg by mouth daily as needed (acid reflux).      polycarbophil (FIBERCON) 625 MG tablet Take 625 mg by mouth daily.     simvastatin (ZOCOR) 20 MG tablet Take 10 mg by mouth every evening.      vitamin B-12 (CYANOCOBALAMIN) 1000 MCG tablet Take 1,000 mcg by mouth as directed. 3 times weekly.  Monday, Wednesday, Friday     vitamin C (ASCORBIC ACID) 500 MG tablet Take 500 mg by mouth daily.     vitamin E 400 UNIT capsule Take 400 Units by mouth daily.     carbidopa-levodopa (SINEMET IR) 25-100 MG tablet Take 1 tablet by mouth 3 (three) times daily before meals. 270 tablet 1   memantine (NAMENDA) 5 MG tablet Take 1 tablet (5 mg total) by mouth 2 (two) times daily. 180 tablet 1   amLODipine (NORVASC) 2.5 MG tablet Take 2.5 mg by mouth daily. (Patient not taking: Reported on 03/09/2023)     saccharomyces boulardii (FLORASTOR) 250 MG capsule Take 250 mg by mouth daily. (Patient not taking: Reported on 03/09/2023)     No facility-administered  medications prior to visit.     PAST MEDICAL HISTORY: Past Medical History:  Diagnosis Date   CKD (chronic kidney disease)    "moderate"   Hyperlipidemia    Hypertension    Spinal stenosis      PAST SURGICAL HISTORY: Past Surgical History:  Procedure Laterality Date   BREAST BIOPSY Left 1977   benign   CATARACT EXTRACTION, BILATERAL Bilateral 2014   CHOLECYSTECTOMY, LAPAROSCOPIC  2001   TONSILLECTOMY     TOTAL VAGINAL HYSTERECTOMY  1981   secondary to uterine prolapse     FAMILY HISTORY: Family History  Problem Relation Age of Onset   Breast cancer Sister    Stroke Sister    Kidney failure Mother    Hypertension Mother    Kidney failure Father    Hypertension Father    Colon cancer Daughter 35       resection of colon, had chemo and is now cancer free. at age 85 -2018   Anuerysm Daughter 39   Diabetes Daughter 81       treaated with oral med     SOCIAL HISTORY: Social History   Socioeconomic History   Marital status: Widowed    Spouse name: Not on file   Number of children: 1   Years of education: 71   Highest education level: Not on file  Occupational History   Not on file  Tobacco Use   Smoking status: Never   Smokeless tobacco: Never  Substance and Sexual Activity   Alcohol use: Yes    Alcohol/week: 2.0 standard drinks of alcohol    Types: 2 Standard drinks or equivalent per week    Comment: wine on Friday nights   Drug use: No   Sexual activity: Never    Partners: Male    Birth control/protection: Surgical    Comment: hysterectomy  Other Topics Concern   Not on file  Social History Narrative   04/28/20 Lives alone, daughter lives at [facility].  Good neighbor support.  Husband passed at age 76 from heart disease and also was diabetic.   Caffeine- coffee, 1 cup daily, maybe occas soda   Social Determinants of Health   Financial Resource Strain: Not on file  Food Insecurity: Not on file  Transportation Needs: Not on file  Physical  Activity: Not on file  Stress: Not on file  Social Connections: Not on file  Intimate Partner Violence: Not on file     PHYSICAL EXAM  Vitals:   03/09/23 1418  BP: (!) 197/84  Pulse: 84  Weight: 110 lb 8 oz (50.1 kg)  Height: 5\' 2"  (1.575 m)     Body mass index is 20.21 kg/m.   Generalized: Well developed, in no acute distress  Cardiology: normal rate and rhythm, no murmur auscultated  Respiratory: clear to auscultation bilaterally    Neurological examination  Mentation: Alert oriented to time, place, history taking. Follows all commands speech and language fluent Cranial nerve II-XII: Pupils were equal round reactive to light. Extraocular movements were full, visual field were full on confrontational test. Facial sensation and strength were normal. Head turning and shoulder shrug  were normal and symmetric. Motor: The motor testing reveals 5 over 5 strength of all 4 extremities. Good symmetric motor tone is noted throughout. Very mild right cogwheel rigidity noted. Dysrhythmia noted with toe taps. No tremor noted  Sensory: Sensory testing is intact to soft touch on all 4 extremities. No evidence of extinction is noted.  Coordination: Cerebellar testing reveals good finger-nose-finger and heel-to-shin bilaterally.  Gait and station: Gait is normal.  Reflexes: Deep tendon reflexes are symmetric and normal bilaterally.    DIAGNOSTIC DATA (LABS, IMAGING, TESTING) - I reviewed patient records, labs, notes, testing and imaging myself where available.  No results found for: "WBC", "HGB", "HCT", "MCV", "PLT" No results found for: "NA", "K", "CL", "CO2", "GLUCOSE", "BUN", "CREATININE", "CALCIUM", "PROT", "ALBUMIN", "AST", "ALT", "ALKPHOS", "BILITOT", "GFRNONAA", "GFRAA" No results found for: "CHOL", "HDL", "LDLCALC", "LDLDIRECT", "TRIG", "CHOLHDL" No results found for: "HGBA1C" No results found for: "VITAMINB12" No results found for: "TSH"     03/09/2023    2:24 PM 09/06/2022    12:58 PM 05/04/2022   11:41 AM  MMSE - Mini Mental State Exam  Orientation to time 4 4 4   Orientation to Place 5 5 5   Registration 3 3 3   Attention/ Calculation 5 5 5   Recall 3 3 2   Language- name 2 objects 2 2 2   Language- repeat 0 1 1  Language- follow 3 step command 3 3 3   Language- read & follow direction 1 1 1   Write a sentence 1 1 1   Copy design 1 1 1   Total score 28 29 28          No data to display           ASSESSMENT AND PLAN  89 y.o. year old female  has a past medical history of CKD (chronic kidney disease), Hyperlipidemia, Hypertension, and Spinal stenosis. here with     Parkinson's disease without dyskinesia or fluctuating manifestations  Memory loss   Taylyn is doing well, today. She will continue carbidopa/levodopa 25-100mg  three times daily. She reports memory loss is unchanged. MMSE 28/30, previously 29/30. We will continue memantine. She prefers to try an extended release tablet to aid in consistently of dosing. I will have her start memantine XR 14mg  daily. She will monitor for any unwanted side effects. She may call me to increase dose if she wishes. Memory compensation strategies advised. She may continue melatonin for sleep if needed. We have discussed benefits of regular exercise in Parkinson's disease. We have discussed importance of using caution when driving. She only drives locally and during daylight hours. BP was quite elevated, today. She is asymptomatic.  She reports not taking her losartan due to getting busy with her daughter, today. She will take once she gets home. Home readings are much lower. I suspect some anxiety may contribute as well. She will follow up with me in 6 months, sooner if needed. She verbalizes understanding and agreement with this plan.    No orders of the defined types were placed in this encounter.     Meds ordered this encounter  Medications   memantine (NAMENDA XR) 14 MG CP24 24 hr capsule    Sig: Take 1 capsule  (14 mg total) by mouth daily.    Dispense:  90 capsule    Refill:  1    Order Specific Question:   Supervising Provider    Answer:   Tracy Bernard [[id number]]   carbidopa-levodopa (SINEMET IR) 25-100 MG tablet    Sig: Take 1 tablet by mouth 3 (three) times daily before meals.    Dispense:  270 tablet    Refill:  1    Order Specific Question:   Supervising Provider    Answer:   Tracy Bernard [id number]     I spent 30 minutes of face-to-face and non-face-to-face time with patient.  This included previsit chart review, lab review, study review, order entry, electronic health record documentation, patient education.   Shawnie Dapper, MSN, FNP-C 03/09/2023, 3:17 PM  [facility] [address] [place], [place] [address] [phone]

## 2023-08-16 ENCOUNTER — Other Ambulatory Visit: Payer: Self-pay

## 2023-08-16 MED ORDER — MEMANTINE HCL ER 14 MG PO CP24: 14.0000 mg | ORAL_CAPSULE | Freq: Every day | ORAL | 1 refills | Status: DC

## 2023-09-15 NOTE — Patient Instructions (Signed)
Below is our plan:  We will continue carb-levo 1 tablet three times daily. We will increase memantine XR to 21mg  daily. You can finish the 14mg  if you wish or go ahead and increase to 21mg  daily.   Consider PT referral if you wish. Use cane for stability when tired or feeling weak. Caution with driving. Stay local, avoid highways and nighttime driving.   Please make sure you are staying well hydrated. I recommend 50-60 ounces daily. Well balanced diet and regular exercise encouraged. Consistent sleep schedule with 6-8 hours recommended.   Please continue follow up with care team as directed.   Follow up with Dr Marjory Lies in 6 months   You may receive a survey regarding today's visit. I encourage you to leave honest feed back as I do use this information to improve patient care. Thank you for seeing me today!

## 2023-09-15 NOTE — Progress Notes (Unsigned)
No chief complaint on file.   HISTORY OF PRESENT ILLNESS:  09/20/23 ALL:  Katarina returns for follow up. She was last seen 03/2023. MMSE stable. Memantine was changed to XR dosing and carb-levo TID continued. Since,   03/09/2023 ALL:  Skilyn returns for follow up for PD with memory loss. She was last seen 08/2022 and doing well. We continued carb-levo IR 1 tab TID and memantine 5mg  BID. We discussed using melatonin to help with insomnia. Since, she reports doing well. Tremor is stable. She continues carb/levo and tolerating well. She started melatonin and feels it has really helped. She is sleeping well. No changes in gait. No recent falls. No assistive devices. Memory is stable. She continues to live alone. She has an alert necklace for safety.   BP at home are usually pretty good. She feels they are usually around 120-130s sys. She has not taken losartan yet today. She got busy with her daughter and hasn't taken medications today. She is able to dose medications independently. She does really well with most all medicaitons with exception of memantine. She often forgets to take second dose. She seems to be tolerating it well.  She is asymptomatic, today.   09/06/2022 ALL: Noell returns for follow up for PD and memory loss. She was last seen 04/2022. We continues carb/levo IR 1 tab TID and added memantine 5mg  BID. Since, She reports doing fairly well. No significant changes. Tremor is well managed. Gait is stable. She feels appetite is good. She has lost about 8lbs over the past year. She denies trouble swallowing. She is having more trouble sleeping. She usually goes to bed around 8:30pm and wakes around 4:30-5am. She feels it takes her longer to get to sleep. She admits to sleeping with TV on. She has tried cutting TV off but feels insomnia is worse. She took trazodone 50mg  that helped. She has not tried melatonin. She feels memory is about the same. She has tolerated memantine.   05/04/2022  ALL: Schuyler returns for follow up for PD. She was last seen 04/2021 and doing fairly well on carb/levo 25-100mg  TID. Since, she reports continuing to do well. She is tolerating medication. She lives alone. She manages finances but admits balancing a checkbook is more difficult for her. She drives some, locally, during the day but does not drive at night. She is able to complete ADLs independently. Right hand tremor is very mild. She reports appetite is good. No trouble swallowing. Gait is fairly stable. No assistive device. She had an unwitnessed fall a few weeks ago while at the beach with friends. She is not sure what caused her to fall. She denies freezing. She reports hitting left hip. She denies any significant injury. She is able to wear wedges. She continues to have chronic back pain. She was seen by NS but not a surgical candidate due to age. She is no longer seeing Dr Ethelene Hal.   She does feel she is having more difficulty with her memory. She feels that she has to think longer about certain information. She uses a calender at home. She tries to stay active. She goes out to eat with friends, regularly. Her daughter lives at [facility].   04/29/2021 ALL: SHEKA RICKARDS is a 89 y.o. female here today for follow up for PD. She continues carbidopa/levodopa 25-100mg  TID. She is tolerating medication well. Tremor has resolved. She does report that her daughter asks her to raise her voice when on the phone. She  denies changes in gait. May feel a little off balance at times. No falls. No assistive devices. She is eating normally. No trouble swallowing. She continues to have significant back pain due to spinal stenosis. She is followed by Dr Ethelene Hal. NS did not feel she was a surgical candidate. She has not been as active. She was started on gabapentin 100-200mg  at bedtime by PCP. She feels it has helped. She participated in PT in the past but was not sure it was helpful.    HISTORY (copied from Dr Richrd Humbles  previous note)  UPDATE (04/28/20, VRP): Since last visit, doing well. Symptoms are improved. Severity is mild. No alleviating or aggravating factors. Tolerating carb/levo.     UPDATE (08/27/19, VRP): Since last visit, doing well, except for 1 fall in March 2020 (left knee pain). Getting better. Symptoms are stable. No alleviating or aggravating factors. Tolerating carb/levo.     UPDATE (12/25/18, VRP): Since last visit, doing well. Symptoms are improved on carb/levo (tremor). BP continues to fluctuate. No alleviating or aggravating factors. Tolerating meds.     PRIOR HPI (08/21/28): 89 year old female here for evaluation of balance and tremor problems.   Patient has had progressive resting and postural tremor in the right upper and right lower extremities since 2016.  She has also had balance and gait difficulty in last 6 months, and has had several falls.  Patient lives alone.  Symptoms have been noticed by patient as well as her daughter.  Daughter has also noticed that patient's voice has become "soft".  Patient denies any swallowing problems or sialorrhea.   Patient has had lack of sense of smell for past 20 years.  She has mild constipation.  She has intermittent wild dreams.  She is just had problems with sleepwalking.  She has some insomnia issues.   Patient was diagnosed with possible essential tremor initially by PCP, but due to progression and change of symptoms consideration of Parkinson's disease was made.  Therefore patient was referred here for further evaluation.   No family history of tremor or Parkinson's disease.   REVIEW OF SYSTEMS: Out of a complete 14 system review of symptoms, the patient complains only of the following symptoms, chronic back pain, fatigue, memory loss and all other reviewed systems are negative.   ALLERGIES: Allergies  Allergen Reactions   Lisinopril Other (See Comments)    cough   Tramadol Other (See Comments)    Sleepiness, confusion     HOME  MEDICATIONS: Outpatient Medications Prior to Visit  Medication Sig Dispense Refill   amLODipine (NORVASC) 2.5 MG tablet Take 2.5 mg by mouth daily. (Patient not taking: Reported on 03/09/2023)     brimonidine (ALPHAGAN) 0.2 % ophthalmic solution Place 1 drop into both eyes 2 (two) times daily.      Calcium Citrate-Vitamin D 200-250 MG-UNIT TABS Take 1 tablet by mouth daily.     carbidopa-levodopa (SINEMET IR) 25-100 MG tablet Take 1 tablet by mouth 3 (three) times daily before meals. 270 tablet 1   diphenhydramine-acetaminophen (TYLENOL PM) 25-500 MG TABS tablet Take 1 tablet by mouth at bedtime as needed (pain).     fexofenadine (ALLEGRA) 180 MG tablet Take 180 mg by mouth daily.     fluticasone (FLONASE) 50 MCG/ACT nasal spray Place 1-2 sprays into the nose daily as needed for allergies or rhinitis.      Krill Oil (OMEGA-3) 500 MG CAPS Take 1 capsule by mouth daily.     latanoprost (XALATAN) 0.005 % ophthalmic solution Place  1 drop into both eyes at bedtime.     losartan (COZAAR) 50 MG tablet Take 50 mg by mouth daily.     memantine (NAMENDA XR) 14 MG CP24 24 hr capsule Take 1 capsule (14 mg total) by mouth daily. 90 capsule 1   Multiple Vitamin (MULTIVITAMIN WITH MINERALS) TABS tablet Take 1 tablet by mouth daily.     omeprazole (PRILOSEC) 20 MG capsule Take 20 mg by mouth daily as needed (acid reflux).      polycarbophil (FIBERCON) 625 MG tablet Take 625 mg by mouth daily.     saccharomyces boulardii (FLORASTOR) 250 MG capsule Take 250 mg by mouth daily. (Patient not taking: Reported on 03/09/2023)     simvastatin (ZOCOR) 20 MG tablet Take 10 mg by mouth every evening.      vitamin B-12 (CYANOCOBALAMIN) 1000 MCG tablet Take 1,000 mcg by mouth as directed. 3 times weekly.  Monday, Wednesday, Friday     vitamin C (ASCORBIC ACID) 500 MG tablet Take 500 mg by mouth daily.     vitamin E 400 UNIT capsule Take 400 Units by mouth daily.     No facility-administered medications prior to visit.      PAST MEDICAL HISTORY: Past Medical History:  Diagnosis Date   CKD (chronic kidney disease)    "moderate"   Hyperlipidemia    Hypertension    Spinal stenosis      PAST SURGICAL HISTORY: Past Surgical History:  Procedure Laterality Date   BREAST BIOPSY Left 1977   benign   CATARACT EXTRACTION, BILATERAL Bilateral 2014   CHOLECYSTECTOMY, LAPAROSCOPIC  2001   TONSILLECTOMY     TOTAL VAGINAL HYSTERECTOMY  1981   secondary to uterine prolapse     FAMILY HISTORY: Family History  Problem Relation Age of Onset   Breast cancer Sister    Stroke Sister    Kidney failure Mother    Hypertension Mother    Kidney failure Father    Hypertension Father    Colon cancer Daughter 60       resection of colon, had chemo and is now cancer free. at age 50 -2018   Anuerysm Daughter 16   Diabetes Daughter 94       treaated with oral med     SOCIAL HISTORY: Social History   Socioeconomic History   Marital status: Widowed    Spouse name: Not on file   Number of children: 1   Years of education: 59   Highest education level: Not on file  Occupational History   Not on file  Tobacco Use   Smoking status: Never   Smokeless tobacco: Never  Substance and Sexual Activity   Alcohol use: Yes    Alcohol/week: 2.0 standard drinks of alcohol    Types: 2 Standard drinks or equivalent per week    Comment: wine on Friday nights   Drug use: No   Sexual activity: Never    Partners: Male    Birth control/protection: Surgical    Comment: hysterectomy  Other Topics Concern   Not on file  Social History Narrative   04/28/20 Lives alone, daughter lives at [facility].  Good neighbor support.  Husband passed at age 107 from heart disease and also was diabetic.   Caffeine- coffee, 1 cup daily, maybe occas soda   Social Determinants of Health   Financial Resource Strain: Not on file  Food Insecurity: Not on file  Transportation Needs: Not on file  Physical Activity: Not on file  Stress:  Not on file  Social Connections: Not on file  Intimate Partner Violence: Not on file     PHYSICAL EXAM  There were no vitals filed for this visit.    There is no height or weight on file to calculate BMI.   Generalized: Well developed, in no acute distress  Cardiology: normal rate and rhythm, no murmur auscultated  Respiratory: clear to auscultation bilaterally    Neurological examination  Mentation: Alert oriented to time, place, history taking. Follows all commands speech and language fluent Cranial nerve II-XII: Pupils were equal round reactive to light. Extraocular movements were full, visual field were full on confrontational test. Facial sensation and strength were normal. Head turning and shoulder shrug  were normal and symmetric. Motor: The motor testing reveals 5 over 5 strength of all 4 extremities. Good symmetric motor tone is noted throughout. Very mild right cogwheel rigidity noted. Dysrhythmia noted with toe taps. No tremor noted  Sensory: Sensory testing is intact to soft touch on all 4 extremities. No evidence of extinction is noted.  Coordination: Cerebellar testing reveals good finger-nose-finger and heel-to-shin bilaterally.  Gait and station: Gait is normal.  Reflexes: Deep tendon reflexes are symmetric and normal bilaterally.    DIAGNOSTIC DATA (LABS, IMAGING, TESTING) - I reviewed patient records, labs, notes, testing and imaging myself where available.  No results found for: "WBC", "HGB", "HCT", "MCV", "PLT" No results found for: "NA", "K", "CL", "CO2", "GLUCOSE", "BUN", "CREATININE", "CALCIUM", "PROT", "ALBUMIN", "AST", "ALT", "ALKPHOS", "BILITOT", "GFRNONAA", "GFRAA" No results found for: "CHOL", "HDL", "LDLCALC", "LDLDIRECT", "TRIG", "CHOLHDL" No results found for: "HGBA1C" No results found for: "VITAMINB12" No results found for: "TSH"     03/09/2023    2:24 PM 09/06/2022   12:58 PM 05/04/2022   11:41 AM  MMSE - Mini Mental State Exam  Orientation  to time 4 4 4   Orientation to Place 5 5 5   Registration 3 3 3   Attention/ Calculation 5 5 5   Recall 3 3 2   Language- name 2 objects 2 2 2   Language- repeat 0 1 1  Language- follow 3 step command 3 3 3   Language- read & follow direction 1 1 1   Write a sentence 1 1 1   Copy design 1 1 1   Total score 28 29 28          No data to display           ASSESSMENT AND PLAN  89 y.o. year old female  has a past medical history of CKD (chronic kidney disease), Hyperlipidemia, Hypertension, and Spinal stenosis. here with     Parkinson's disease without dyskinesia or fluctuating manifestations (HCC)  Memory loss   Lashelle is doing well, today. She will continue carbidopa/levodopa 25-100mg  three times daily. She reports memory loss is unchanged. MMSE 28/30, previously 29/30. We will continue memantine. She prefers to try an extended release tablet to aid in consistently of dosing. I will have her start memantine XR 14mg  daily. She will monitor for any unwanted side effects. She may call me to increase dose if she wishes. Memory compensation strategies advised. She may continue melatonin for sleep if needed. We have discussed benefits of regular exercise in Parkinson's disease. We have discussed importance of using caution when driving. She only drives locally and during daylight hours. BP was quite elevated, today. She is asymptomatic. She reports not taking her losartan due to getting busy with her daughter, today. She will take once she gets home. Home readings are much lower. I  suspect some anxiety may contribute as well. She will follow up with me in 6 months, sooner if needed. She verbalizes understanding and agreement with this plan.    No orders of the defined types were placed in this encounter.     No orders of the defined types were placed in this encounter.    I spent 30 minutes of face-to-face and non-face-to-face time with patient.  This included previsit chart review, lab  review, study review, order entry, electronic health record documentation, patient education.   Shawnie Dapper, MSN, FNP-C 09/20/2023, 12:22 PM  [facility] [address], [address] [place], [place] [address] [phone]

## 2023-09-19 ENCOUNTER — Other Ambulatory Visit: Payer: Self-pay

## 2023-09-19 MED ORDER — CARBIDOPA-LEVODOPA 25-100 MG PO TABS: 1.0000 | ORAL_TABLET | Freq: Three times a day (TID) | ORAL | 1 refills | Status: DC

## 2023-09-21 ENCOUNTER — Ambulatory Visit: Payer: Medicare Other | Admitting: Family Medicine

## 2023-09-21 ENCOUNTER — Encounter: Payer: Self-pay | Admitting: Family Medicine

## 2023-09-21 VITALS — BP 131/54 | HR 69 | Ht 62.0 in | Wt 109.5 lb

## 2023-09-21 DIAGNOSIS — G20A1 Parkinson's disease without dyskinesia, without mention of fluctuations: Secondary | ICD-10-CM

## 2023-09-21 DIAGNOSIS — Z9181 History of falling: Secondary | ICD-10-CM

## 2023-09-21 DIAGNOSIS — R2681 Unsteadiness on feet: Secondary | ICD-10-CM | POA: Diagnosis not present

## 2023-09-21 DIAGNOSIS — R413 Other amnesia: Secondary | ICD-10-CM

## 2023-09-21 MED ORDER — MEMANTINE HCL ER 21 MG PO CP24: 21.0000 mg | ORAL_CAPSULE | Freq: Every day | ORAL | 1 refills | Status: DC

## 2024-02-09 ENCOUNTER — Other Ambulatory Visit: Payer: Self-pay | Admitting: *Deleted

## 2024-02-09 MED ORDER — MEMANTINE HCL ER 21 MG PO CP24: 21.0000 mg | ORAL_CAPSULE | Freq: Every day | ORAL | 0 refills | Status: DC

## 2024-02-21 ENCOUNTER — Other Ambulatory Visit: Payer: Self-pay | Admitting: *Deleted

## 2024-02-21 MED ORDER — CARBIDOPA-LEVODOPA 25-100 MG PO TABS: 1.0000 | ORAL_TABLET | Freq: Three times a day (TID) | ORAL | 1 refills | Status: DC

## 2024-03-20 ENCOUNTER — Ambulatory Visit: Payer: Medicare Other | Admitting: Diagnostic Neuroimaging

## 2024-03-20 ENCOUNTER — Encounter: Payer: Self-pay | Admitting: Diagnostic Neuroimaging

## 2024-03-20 VITALS — BP 131/68 | HR 67 | Ht 62.0 in | Wt 104.0 lb

## 2024-03-20 DIAGNOSIS — G20A1 Parkinson's disease without dyskinesia, without mention of fluctuations: Secondary | ICD-10-CM

## 2024-03-20 DIAGNOSIS — R413 Other amnesia: Secondary | ICD-10-CM | POA: Diagnosis not present

## 2024-03-20 DIAGNOSIS — Z9181 History of falling: Secondary | ICD-10-CM

## 2024-03-20 DIAGNOSIS — R2681 Unsteadiness on feet: Secondary | ICD-10-CM

## 2024-03-20 MED ORDER — MEMANTINE HCL ER 21 MG PO CP24: 21.0000 mg | ORAL_CAPSULE | Freq: Every day | ORAL | 4 refills | Status: AC

## 2024-03-20 MED ORDER — CARBIDOPA-LEVODOPA 25-100 MG PO TABS: 1.0000 | ORAL_TABLET | Freq: Three times a day (TID) | ORAL | 4 refills | Status: AC

## 2024-03-20 NOTE — Progress Notes (Signed)
 [facility]  PATIENT: Tracy Bernard DOB: Apr 03, 1931  REFERRING CLINICIAN: Helyn Lobstein, MD  HISTORY FROM: patient REASON FOR VISIT: follow up   HISTORICAL  CHIEF COMPLAINT:  Chief Complaint  Patient presents with   Tremors    Rm 6 with friend  Pt is well and stable, reports she is still having some imbalance. No new concerns with parkinsons .    HISTORY OF PRESENT ILLNESS:   UPDATE (03/20/24, VRP): Since last visit with Terrilyn Fick NP in Nov 2024, doing well. Memory and balance continue to decline. Now has help from friend / aid for last 1-2 weeks. Living at home alone otherwise.    UPDATE (04/28/20, VRP): Since last visit, doing well. Symptoms are improved. Severity is mild. No alleviating or aggravating factors. Tolerating carb/levo.    UPDATE (08/27/19, VRP): Since last visit, doing well, except for 1 fall in March 2020 (left knee pain). Getting better. Symptoms are stable. No alleviating or aggravating factors. Tolerating carb/levo.    UPDATE (12/25/18, VRP): Since last visit, doing well. Symptoms are improved on carb/levo (tremor). BP continues to fluctuate. No alleviating or aggravating factors. Tolerating meds.    PRIOR HPI (08/21/28): 89 year old female here for evaluation of balance and tremor problems.  Patient has had progressive resting and postural tremor in the right upper and right lower extremities since 2016.  She has also had balance and gait difficulty in last 6 months, and has had several falls.  Patient lives alone.  Symptoms have been noticed by patient as well as her daughter.  Daughter has also noticed that patient's voice has become "soft".  Patient denies any swallowing problems or sialorrhea.  Patient has had lack of sense of smell for past 20 years.  She has mild constipation.  She has intermittent wild dreams.  She is just had problems with sleepwalking.  She has some insomnia issues.  Patient was diagnosed with possible essential  tremor initially by PCP, but due to progression and change of symptoms consideration of Parkinson's disease was made.  Therefore patient was referred here for further evaluation.  No family history of tremor or Parkinson's disease.   REVIEW OF SYSTEMS: Full 14 system review of systems performed and negative with exception of: as per HPI.   ALLERGIES: Allergies  Allergen Reactions   Lisinopril Other (See Comments)    cough   Tramadol Other (See Comments)    Sleepiness, confusion    HOME MEDICATIONS: Outpatient Medications Prior to Visit  Medication Sig Dispense Refill   amLODipine (NORVASC) 2.5 MG tablet Take 2.5 mg by mouth daily.     brimonidine (ALPHAGAN) 0.2 % ophthalmic solution Place 1 drop into both eyes 2 (two) times daily.      carbidopa -levodopa  (SINEMET  IR) 25-100 MG tablet Take 1 tablet by mouth 3 (three) times daily before meals. 270 tablet 1   diphenhydramine-acetaminophen  (TYLENOL  PM) 25-500 MG TABS tablet Take 1 tablet by mouth at bedtime as needed (pain).     fexofenadine (ALLEGRA) 180 MG tablet Take 180 mg by mouth daily.     fluticasone (FLONASE) 50 MCG/ACT nasal spray Place 1-2 sprays into the nose daily as needed for allergies or rhinitis.      Krill Oil (OMEGA-3) 500 MG CAPS Take 1 capsule by mouth daily.     latanoprost (XALATAN) 0.005 % ophthalmic solution Place 1 drop into both eyes at bedtime.     losartan (COZAAR) 50 MG tablet Take 50 mg by mouth daily.  memantine  (NAMENDA  XR) 21 MG CP24 24 hr capsule Take 1 capsule (21 mg total) by mouth daily. 90 capsule 0   Multiple Vitamin (MULTIVITAMIN WITH MINERALS) TABS tablet Take 1 tablet by mouth daily.     omeprazole (PRILOSEC) 20 MG capsule Take 20 mg by mouth daily as needed (acid reflux).      polycarbophil (FIBERCON) 625 MG tablet Take 625 mg by mouth daily.     saccharomyces boulardii (FLORASTOR) 250 MG capsule Take 250 mg by mouth daily.     simvastatin (ZOCOR) 20 MG tablet Take 10 mg by mouth every  evening.      vitamin B-12 (CYANOCOBALAMIN) 1000 MCG tablet Take 1,000 mcg by mouth as directed. 3 times weekly.  Monday, Wednesday, Friday     vitamin C (ASCORBIC ACID) 500 MG tablet Take 500 mg by mouth daily.     vitamin E 400 UNIT capsule Take 400 Units by mouth daily.     Calcium Citrate-Vitamin D  200-250 MG-UNIT TABS Take 1 tablet by mouth daily. (Patient not taking: Reported on 09/21/2023)     No facility-administered medications prior to visit.    PAST MEDICAL HISTORY: Past Medical History:  Diagnosis Date   CKD (chronic kidney disease)    "moderate"   Hyperlipidemia    Hypertension    Spinal stenosis     PAST SURGICAL HISTORY: Past Surgical History:  Procedure Laterality Date   BREAST BIOPSY Left 1977   benign   CATARACT EXTRACTION, BILATERAL Bilateral 2014   CHOLECYSTECTOMY, LAPAROSCOPIC  2001   TONSILLECTOMY     TOTAL VAGINAL HYSTERECTOMY  1981   secondary to uterine prolapse    FAMILY HISTORY: Family History  Problem Relation Age of Onset   Breast cancer Sister    Stroke Sister    Kidney failure Mother    Hypertension Mother    Kidney failure Father    Hypertension Father    Colon cancer Daughter 39       resection of colon, had chemo and is now cancer free. at age 94 -2018   Anuerysm Daughter 79   Diabetes Daughter 26       treaated with oral med    SOCIAL HISTORY: Social History   Socioeconomic History   Marital status: Widowed    Spouse name: Not on file   Number of children: 1   Years of education: 34   Highest education level: Not on file  Occupational History   Not on file  Tobacco Use   Smoking status: Never   Smokeless tobacco: Never  Substance and Sexual Activity   Alcohol use: Yes    Alcohol/week: 2.0 standard drinks of alcohol    Types: 2 Standard drinks or equivalent per week    Comment: wine on Friday nights   Drug use: No   Sexual activity: Never    Partners: Male    Birth control/protection: Surgical    Comment:  hysterectomy  Other Topics Concern   Not on file  Social History Narrative   04/28/20 Lives alone, daughter lives at [facility].  Good neighbor support.  Husband passed at age 50 from heart disease and also was diabetic.   Caffeine- coffee, 1 cup daily, maybe occas soda   Social Drivers of Corporate investment banker Strain: Not on file  Food Insecurity: Not on file  Transportation Needs: Not on file  Physical Activity: Not on file  Stress: Not on file  Social Connections: Not on file  Intimate Partner Violence:  Not on file    PHYSICAL EXAM   GENERAL EXAM/CONSTITUTIONAL: Vitals:  Vitals:   03/20/24 1417  BP: 131/68  Pulse: 67  Weight: 104 lb (47.2 kg)  Height: 5\' 2"  (1.575 m)   Body mass index is 19.02 kg/m. Wt Readings from Last 3 Encounters:  03/20/24 104 lb (47.2 kg)  09/21/23 109 lb 8 oz (49.7 kg)  03/09/23 110 lb 8 oz (50.1 kg)   Patient is in no distress; well developed, nourished and groomed; neck is supple  CARDIOVASCULAR: Examination of carotid arteries is normal; no carotid bruits Regular rate and rhythm, no murmurs Examination of peripheral vascular system by observation and palpation is normal  EYES: Ophthalmoscopic exam of optic discs and posterior segments is normal; no papilledema or hemorrhages No results found.  MUSCULOSKELETAL: Gait, strength, tone, movements noted in Neurologic exam below  NEUROLOGIC: MENTAL STATUS:     03/20/2024    2:21 PM 09/21/2023    1:24 PM 03/09/2023    2:24 PM  MMSE - Mini Mental State Exam  Orientation to time 5 4 4   Orientation to Place 5 5 5   Registration 3 3 3   Attention/ Calculation 1 1 5   Recall 2 3 3   Language- name 2 objects 2 2 2   Language- repeat 1 1 0  Language- follow 3 step command 3 3 3   Language- read & follow direction 1 1 1   Write a sentence 1 1 1   Copy design 1 1 1   Total score 25 25 28    awake, alert, oriented to person, place and time recent and remote memory intact normal attention  and concentration language fluent, comprehension intact, naming intact fund of knowledge appropriate  CRANIAL NERVE:  2nd - no papilledema on fundoscopic exam 2nd, 3rd, 4th, 6th - pupils equal and reactive to light, visual fields full to confrontation, extraocular muscles intact, no nystagmus 5th - facial sensation symmetric 7th - facial strength symmetric 8th - hearing intact 9th - palate elevates symmetrically, uvula midline 11th - shoulder shrug symmetric 12th - tongue protrusion midline HYPOMIMIA SOFT VOICE  MOTOR:  NO TREMOR  NO COGWHEELING RIGIDITY MILD BRADYKINESIA IN BUE / BLE full strength in the BUE, BLE; EXCEPT DECR RIGHT FOOT DF  SENSORY:  normal and symmetric to light touch ABSENT VIB AT ANKLES AND TOES  COORDINATION:  finger-nose-finger, fine finger movements SLOW  REFLEXES:  deep tendon reflexes TRACE and symmetric  GAIT/STATION:  DECR RIGHT ARM SWING WITH WALKING SLIGHTLY STOOPED POSTURE SMOOTH STRIDE, SHORT STEPS SMOOTH TURNING     DIAGNOSTIC DATA (LABS, IMAGING, TESTING) - I reviewed patient records, labs, notes, testing and imaging myself where available.  No results found for: "WBC", "HGB", "HCT", "MCV", "PLT" No results found for: "NA", "K", "CL", "CO2", "GLUCOSE", "BUN", "CREATININE", "CALCIUM", "PROT", "ALBUMIN", "AST", "ALT", "ALKPHOS", "BILITOT", "GFRNONAA", "GFRAA" No results found for: "CHOL", "HDL", "LDLCALC", "LDLDIRECT", "TRIG", "CHOLHDL" No results found for: "HGBA1C" No results found for: "VITAMINB12" No results found for: "TSH"   07/12/18 MRI lumbar spine [I reviewed images myself. -VRP]  - At L4-5: severe spinal stenosis and severe biforaminal stenosis; anterior spondylolisthesis of L4 on L5 (5mm) - At L3-4: moderate spinal stenosis   07/26/18 NM bone scan 1. Degenerative findings in the lower lumbar spine, shoulders, base of thumbs, knees, and left first MTP joint. 2. Dextroconvex lower thoracic and lumbar scoliosis. 3. Focal  of accentuated activity in the right femur greater trochanter is nonspecific but likely reactive at the gluteal insertion site.  09/08/18 MRI  brain [I reviewed images myself and agree with interpretation. -VRP]  1.     Mild generalized cortical atrophy that is moderate in the mesial temporal lobes 2.     Mild chronic microvascular ischemic changes in the hemispheres. 3.     Chronic sphenoid sinusitis. 4.     There are no acute findings.    ASSESSMENT AND PLAN  89 y.o. year old female here with gradual onset progressive resting tremor, cogwheel rigidity, bradykinesia, balance difficulty, hypomania, lack of sense of smell, with signs and symptoms most consistent with idiopathic Parkinson's disease.  Patient also has history of lumbar spinal stenosis with neurogenic claudication, currently being managed conservatively.  Reviewed diagnosis, prognosis and treatment options.  Reviewed community resources with patient.  Dx:  1. Parkinson's disease without dyskinesia or fluctuating manifestations (HCC)   2. Memory loss   3. Unsteady gait   4. At high risk for falls     PLAN:  PARKINSON'S DISEASE WITH MEMORY LOSS AND GAIT DIFFICULTY - continue carb/levo 25/100 1 tab three times a day; take 30 minutes before meals - use rollator walker at all times - continue supervision and help at home (has friend / aid now 7 days per week during the day, but not overnight) - handicap tag provided  LUMBAR SPINAL STENOSIS - conservative mgmt (not surgery candidate per neurosurgery)  Orders Placed This Encounter  Procedures   For home use only DME Walker rolling    Rollator with bench and brakes    Walker::   With 5 [employer]    Patient needs a walker to treat with the following condition:   Parkinson disease (HCC) [161096]   Return in about 1 year (around 03/20/2025) for MyChart visit (15 min).   Omega Bible, MD 03/20/2024, 2:49 PM Certified in Neurology, Neurophysiology and  Neuroimaging  [facility] [address] [place], [place] [address] [phone]

## 2024-03-20 NOTE — Patient Instructions (Signed)
 PARKINSON'S DISEASE WITH MEMORY LOSS AND GAIT DIFFICULTY - continue carb/levo 25/100 1 tab three times a day; take 30 minutes before meals - use rollator walker at all times - consider home PT evaluation - continue supervision and help at home (has friend / aid now 7 days per week during the day, but not overnight)  LUMBAR SPINAL STENOSIS - conservative mgmt (not surgery candidate per neurosurgery)

## 2024-08-27 ENCOUNTER — Telehealth: Payer: Self-pay | Admitting: Diagnostic Neuroimaging

## 2024-08-27 NOTE — Telephone Encounter (Signed)
Patient called to verify appointment date and time

## 2024-11-28 NOTE — Therapy (Signed)
 " OUTPATIENT PHYSICAL THERAPY LOWER EXTREMITY EVALUATION   Patient Name: Tracy Bernard MRN: [id number] DOB:Mar 11, 1931, 89 y.o., female Today's Date: 11/30/2024  END OF SESSION:  PT End of Session - 11/29/24 1237     Visit Number 1    Number of Visits 6    Date for Recertification  01/24/25    Authorization Type [employer] MCR    PT Start Time 1156    PT Stop Time 1241    PT Time Calculation (min) 45 min    Activity Tolerance Patient tolerated treatment well    Behavior During Therapy WFL for tasks assessed/performed          Past Medical History:  Diagnosis Date   CKD (chronic kidney disease)    moderate   Hyperlipidemia    Hypertension    Spinal stenosis    Past Surgical History:  Procedure Laterality Date   BREAST BIOPSY Left 1977   benign   CATARACT EXTRACTION, BILATERAL Bilateral 2014   CHOLECYSTECTOMY, LAPAROSCOPIC  2001   TONSILLECTOMY     TOTAL VAGINAL HYSTERECTOMY  1981   secondary to uterine prolapse   Patient Active Problem List   Diagnosis Date Noted   Vitamin D  deficiency 12/17/2014   Atrophic vaginitis 12/17/2014   HTN (hypertension) 09/10/2013   Hypercholesterolemia 09/10/2013   Postmenopausal HRT (hormone replacement therapy) 09/10/2013    PCP: Aisha Harvey, MD   REFERRING PROVIDER: Aisha Harvey, MD   REFERRING DIAG: [phone] (ICD-10-CM) - Osteopenia of left hip   THERAPY DIAG:  Pain in left hip  Muscle weakness (generalized)  Other abnormalities of gait and mobility  Other lack of coordination  Rationale for Evaluation and Treatment: Rehabilitation  ONSET DATE: Aisha Harvey  SUBJECTIVE:   SUBJECTIVE STATEMENT: PT order 10/03/24 Pt has had PT in the past for lumbar though states it didn't help much.  Pt had a 4 falls in the past 6 months with the worst one in July 2025.  Pt has had L hip pain for awhile.  Pt states her hip is feeling much better.  She lies supine and feels better.  Pt uses a rollator in her home, but uses quad  cane outside of home.  She primarily ambulates in her home with rollator independently.  Pt has minimal pain with ambulation around home and has increased pain with ambulating longer distance.  She has balance deficits which is her main concern.  Legs always feel weak.  Pt's friend/caregiver present.  Pt lives alone and her caregiver sees her about 3x/wk.    PERTINENT HISTORY: Parkinson's disease--followed by neurology Spinal stenosis/scoliosis--back and LE pain Mild cognitive disorder Chronic kidney disease   PAIN:  NPRS:  2-3/10 current, 4/10 current, 10/10 worst (though not lately) Location:  L hip   PRECAUTIONS: Other: FALL, per dx, parkinson's disease      WEIGHT BEARING RESTRICTIONS: No  FALLS:  Has patient fallen in last 6 months? Yes. Number of falls 4, worst fall in July when tripping over molding.  Other falls including reaching for rollator and had no bruising or adverse effects  LIVING ENVIRONMENT: Lives with: lives alone Lives in: 2 story home, but stays on the 1st floor Stairs: yes Has following equipment at home: Quad cane, rollator   PLOF: Independent with ADL's and mobility inside home.  Pt's friend/caregiver comes 3x/wk.    PATIENT GOALS: improve balance.  Improve pain though pain is better   OBJECTIVE:  Note: Objective measures were completed at Evaluation unless otherwise noted.  DIAGNOSTIC FINDINGS:  In epic negative hip x ray in 2020  PATIENT SURVEYS:  LEFS:  28/80  COGNITION: Overall cognitive status: Within functional limits for tasks assessed ; Pt's friend/caregiver did respond to a lot of subjective questions.      POSTURE:  Stooped over posture   LOWER EXTREMITY ROM:   ROM Right eval Left eval  Hip flexion    Hip extension    Hip abduction    Hip adduction    Hip internal rotation    Hip external rotation    Knee flexion    Knee extension    Ankle dorsiflexion    Ankle plantarflexion    Ankle inversion    Ankle  eversion     (Blank rows = not tested)  LOWER EXTREMITY MMT:  MMT Right eval Left eval  Hip flexion Tol min resistance Tol min resistance  Hip extension    Hip abduction    Hip adduction    Hip internal rotation    Hip external rotation    Knee flexion    Knee extension Tol min resistance  Tol min resistance  Ankle dorsiflexion    Ankle plantarflexion    Ankle inversion    Ankle eversion     (Blank rows = not tested)   FUNCTIONAL TESTS:  Pt able to stand from chair without using Ue's. 5x STS test:  28.11 sec without using Ue's.   TUG:  21.44 sec with quad cane  Balance:  Standing with FA x 1 min independently. Leans to L Standing with NBOS x 30 sec with some sway though had no LOB.  Leans to L. Unable to perform tandem stance.     GAIT:  Comments: Pt ambulates with a quad cane with caregiver providing assistance.  She had small LOB's with gait which caregiver and PT provided assistance.                                                                                                                                 TREATMENT:     PATIENT EDUCATION:  Education details: dx, objective findings, relevant anatomy, scope of PT, rationale of interventions, and what to expect next treatment.  PT answered pt's caregiver's questions.   Person educated: Patient and Caregiver friend Education method: Explanation Education comprehension: verbalized understanding and needs further education  HOME EXERCISE PROGRAM: Will give at a later date.  ASSESSMENT:  CLINICAL IMPRESSION: Patient is a 89 y.o. female with a dx of L hip osteopenia presenting to the clinic with L hip pain, muscle weakness in LE's, balance deficits, and gait abnormality.  Pt's friend/caregiver was with pt today.  Pt ambulated with a quad cane with caregiver providing assistance.  She had small LOB's with gait which caregiver and PT provided assistance.  Pt uses a rollator in her home, but uses quad cane outside of  home.  Pt has minimal pain with ambulation around home and has  increased pain with ambulating longer distance.  Her main concern is balance deficits.  It required increased time to perform 5x STS test and TUG test indicating pt being at fall risk and having LE muscle weakness.  Pt should benefit from skilled PT to address impairments and overall function.     OBJECTIVE IMPAIRMENTS: Abnormal gait, decreased activity tolerance, decreased balance, decreased endurance, decreased mobility, difficulty walking, decreased strength, and pain.   ACTIVITY LIMITATIONS: standing, squatting, stairs, transfers, and locomotion level  PARTICIPATION LIMITATIONS: cleaning, shopping, and community activity  PERSONAL FACTORS: Age, Time since onset of injury/illness/exacerbation, and 1-2 comorbidities: Parkinson's and Spinal stenosis/scoliosis--back and LE pain are also affecting patient's functional outcome.   REHAB POTENTIAL: Good  CLINICAL DECISION MAKING: Stable/uncomplicated  EVALUATION COMPLEXITY: Low   GOALS:   SHORT TERM GOALS: Target date: 12/20/2024  Pt will report at least a 25% improvement in L hip pain with ambulation.  Baseline: Goal status: INITIAL  2.  Pt will report she is able to perform her daily mobility with improved balance and stability.  Baseline:  Goal status: INITIAL    LONG TERM GOALS: Target date: 01/24/2025  Pt will demonstrate improved stability with gait in the clinic with her quad cane requiring decreased assistance and having less LOB's. Baseline:  Goal status: INITIAL  2.  Pt will report she is able to perform her daily mobility without significant hip pain.  Baseline:  Goal status: INITIAL  3.  Pt will perform TUG in < than 16 sec with good stability.   Baseline:  Goal status: INITIAL  4.  Pt will demo improved bilat knee extension to 4-/5 bilat for improved performance of functional mobility skills including sit to stands and ambulation.  Baseline:  Goal  status: INITIAL    PLAN:  PT FREQUENCY: 1x/week  PT DURATION: 6 weeks- 8 weeks  PLANNED INTERVENTIONS: [id number]- PT Re-evaluation, 97750- Physical Performance Testing, 97110-Therapeutic exercises, 97530- Therapeutic activity, [id number]- Neuromuscular re-education, 97535- Self Care, 02859- Manual therapy, [id number]- Gait training, [phone]- Aquatic Therapy, [phone]- Electrical stimulation (unattended), [phone]- Electrical stimulation (manual), [id number]- Ultrasound, 79439 (1-2 muscles), 20561 (3+ muscles)- Dry Needling, Patient/Family education, Balance training, Stair training, Taping, DME instructions, Cryotherapy, and Moist heat  PLAN FOR NEXT SESSION: Cont with strengthening, balance training, and gait.     Leigh Minerva III PT, DPT 12/01/24 5:15 PM   Date of referral: 10/03/24 Referring provider:  Aisha Harvey, MD    Referring diagnosis? M85.852 (ICD-10-CM) - Osteopenia of left hip   Treatment diagnosis? (if different than referring diagnosis)  Pain in left hip  Muscle weakness (generalized)  Other abnormalities of gait and mobility  Other lack of coordination  What was this (referring dx) caused by? Ongoing Issue  Lysle of Condition: Chronic (continuous duration > 3 months)   Laterality: Lt  Current Functional Measure Score: LEFS 28/80  Objective measurements identify impairments when they are compared to normal values, the uninvolved extremity, and prior level of function.  [x]  Yes  []  No  Objective assessment of functional ability: Moderate functional limitations   Briefly describe symptoms: pain in L hip, balance deficits  How did symptoms start: see subjective  Average pain intensity: NPRS:  2-3/10 current, 4/10 current, 10/10 worst (though not lately)    How often does the pt experience symptoms? Frequently  How much have the symptoms interfered with usual daily activities? A little bit  How has condition changed since care began at this facility? NA - initial  visit  In general, how  is the patients overall health? Good   BACK PAIN (STarT Back Screening Tool)    "

## 2024-11-29 ENCOUNTER — Ambulatory Visit (HOSPITAL_BASED_OUTPATIENT_CLINIC_OR_DEPARTMENT_OTHER): Attending: Family Medicine | Admitting: Physical Therapy

## 2024-11-29 DIAGNOSIS — R278 Other lack of coordination: Secondary | ICD-10-CM | POA: Insufficient documentation

## 2024-11-29 DIAGNOSIS — R2689 Other abnormalities of gait and mobility: Secondary | ICD-10-CM | POA: Insufficient documentation

## 2024-11-29 DIAGNOSIS — M6281 Muscle weakness (generalized): Secondary | ICD-10-CM | POA: Insufficient documentation

## 2024-11-29 DIAGNOSIS — M25552 Pain in left hip: Secondary | ICD-10-CM | POA: Diagnosis present

## 2024-11-30 ENCOUNTER — Encounter (HOSPITAL_BASED_OUTPATIENT_CLINIC_OR_DEPARTMENT_OTHER): Payer: Self-pay | Admitting: Physical Therapy

## 2024-11-30 ENCOUNTER — Other Ambulatory Visit: Payer: Self-pay

## 2025-01-09 ENCOUNTER — Encounter (HOSPITAL_BASED_OUTPATIENT_CLINIC_OR_DEPARTMENT_OTHER): Admitting: Physical Therapy

## 2025-01-16 ENCOUNTER — Encounter (HOSPITAL_BASED_OUTPATIENT_CLINIC_OR_DEPARTMENT_OTHER): Admitting: Physical Therapy

## 2025-01-23 ENCOUNTER — Encounter (HOSPITAL_BASED_OUTPATIENT_CLINIC_OR_DEPARTMENT_OTHER): Admitting: Physical Therapy

## 2025-01-30 ENCOUNTER — Encounter (HOSPITAL_BASED_OUTPATIENT_CLINIC_OR_DEPARTMENT_OTHER): Admitting: Physical Therapy

## 2025-03-25 ENCOUNTER — Ambulatory Visit: Payer: Self-pay | Admitting: Diagnostic Neuroimaging
